# Patient Record
Sex: Female | Born: 1985 | Race: Black or African American | Hispanic: No | Marital: Married | Smoking: Never smoker
Health system: Southern US, Community
[De-identification: ages and names within clinical notes are randomized; demographics above are authoritative.]

## PROBLEM LIST (undated history)

## (undated) DIAGNOSIS — I10 Essential (primary) hypertension: Secondary | ICD-10-CM

## (undated) DIAGNOSIS — R7303 Prediabetes: Secondary | ICD-10-CM

## (undated) DIAGNOSIS — F419 Anxiety disorder, unspecified: Secondary | ICD-10-CM

## (undated) DIAGNOSIS — K219 Gastro-esophageal reflux disease without esophagitis: Secondary | ICD-10-CM

## (undated) HISTORY — DX: Gastro-esophageal reflux disease without esophagitis: K21.9

## (undated) HISTORY — DX: Essential (primary) hypertension: I10

## (undated) HISTORY — PX: CHOLECYSTECTOMY: SHX55

## (undated) HISTORY — DX: Anxiety disorder, unspecified: F41.9

## (undated) HISTORY — DX: Prediabetes: R73.03

---

## 2004-10-25 ENCOUNTER — Emergency Department (HOSPITAL_COMMUNITY): Admission: EM | Admit: 2004-10-25 | Discharge: 2004-10-25 | Payer: Self-pay | Admitting: Emergency Medicine

## 2006-09-04 ENCOUNTER — Other Ambulatory Visit: Payer: Self-pay

## 2006-09-04 ENCOUNTER — Emergency Department: Payer: Self-pay | Admitting: Emergency Medicine

## 2006-09-08 ENCOUNTER — Ambulatory Visit: Payer: Self-pay | Admitting: General Surgery

## 2007-11-19 ENCOUNTER — Ambulatory Visit: Payer: Self-pay | Admitting: Internal Medicine

## 2012-02-07 ENCOUNTER — Ambulatory Visit: Payer: Self-pay | Admitting: Podiatry

## 2012-09-04 ENCOUNTER — Ambulatory Visit: Payer: Self-pay | Admitting: Podiatry

## 2013-06-25 ENCOUNTER — Emergency Department (INDEPENDENT_AMBULATORY_CARE_PROVIDER_SITE_OTHER): Payer: 59

## 2013-06-25 ENCOUNTER — Emergency Department (HOSPITAL_COMMUNITY)
Admission: EM | Admit: 2013-06-25 | Discharge: 2013-06-25 | Disposition: A | Discharge status: Home / Self Care | Payer: 59 | Source: Home / Self Care | Attending: Family Medicine | Admitting: Family Medicine

## 2013-06-25 ENCOUNTER — Encounter (HOSPITAL_COMMUNITY): Payer: Self-pay | Admitting: Emergency Medicine

## 2013-06-25 DIAGNOSIS — M25579 Pain in unspecified ankle and joints of unspecified foot: Secondary | ICD-10-CM

## 2013-06-25 DIAGNOSIS — M25571 Pain in right ankle and joints of right foot: Secondary | ICD-10-CM

## 2013-06-25 MED ORDER — IBUPROFEN 800 MG PO TABS: 800.0000 mg | ORAL_TABLET | Freq: Three times a day (TID) | ORAL | Status: DC

## 2013-06-25 NOTE — ED Notes (Signed)
C/o right ankle pain x 2 days.  States pain sensation is like a soreness.  Denies injury.  Pt has been using ace wrap with mild relief.

## 2013-06-25 NOTE — ED Provider Notes (Signed)
CSN: [id number]     Arrival date & time 06/25/13  1417 History   First MD Initiated Contact with Patient 06/25/13 1541     Chief Complaint  Patient presents with  . Ankle Pain   (Consider location/radiation/quality/duration/timing/severity/associated sxs/prior Treatment) Patient is a 27 y.o. female presenting with ankle pain. The history is provided by the patient. No language interpreter was used.  Ankle Pain Location:  Ankle Ankle location:  R ankle Pain details:    Quality:  Aching   Radiates to:  Does not radiate   Severity:  Mild   Onset quality:  Gradual   Timing:  Constant   Progression:  Worsening Chronicity:  New Dislocation: no   Foreign body present:  No foreign bodies Relieved by:  Nothing Worsened by:  Nothing tried Ineffective treatments:  None tried Risk factors: no recent illness     History reviewed. No pertinent past medical history. History reviewed. No pertinent past surgical history. History reviewed. No pertinent family history. History  Substance Use Topics  . Smoking status: Never Smoker   . Smokeless tobacco: Not on file  . Alcohol Use: No   OB History   Grav Para Term Preterm Abortions TAB SAB Ect Mult Living                 Review of Systems  All other systems reviewed and are negative.    Allergies  Review of patient's allergies indicates no known allergies.  Home Medications   Current Outpatient Rx  Name  Route  Sig  Dispense  Refill  . benazepril (LOTENSIN) 5 MG tablet   Oral   Take 5 mg by mouth daily.         Marland Kitchen ibuprofen (ADVIL,MOTRIN) 800 MG tablet   Oral   Take 1 tablet (800 mg total) by mouth 3 (three) times daily.   21 tablet   0    BP 135/84  Pulse 77  Temp(Src) 98.1 F (36.7 C) (Oral)  Resp 20  SpO2 100%  LMP 06/01/2013 Physical Exam  Nursing note and vitals reviewed. Constitutional: She is oriented to person, place, and time. She appears well-developed and well-nourished.  HENT:  Head: Normocephalic.   Musculoskeletal: She exhibits tenderness.  Swollen tender left ankle  From with pain,  nv and ns intact  Neurological: She is alert and oriented to person, place, and time. She has normal reflexes.  Skin: Skin is warm.  Psychiatric: She has a normal mood and affect.    ED Course  Procedures (including critical care time) Labs Review Labs Reviewed - No data to display Imaging Review Dg Ankle Complete Right  06/25/2013   CLINICAL DATA:  Pain, swelling no history of trauma.  EXAM: RIGHT ANKLE - COMPLETE 3+ VIEW  COMPARISON:  None.  FINDINGS: Soft tissue swelling is appreciated within the medial and lateral malleolar regions. There is no evidence of acute fracture or dislocation.  IMPRESSION: Soft tissue swelling without evidence of acute osseous abnormalities.   Electronically Signed   By: Salome Holmes M.D.   On: 06/25/2013 16:02    EKG Interpretation    Date/Time:    Ventricular Rate:    PR Interval:    QRS Duration:   QT Interval:    QTC Calculation:   R Axis:     Text Interpretation:              MDM   1. Ankle pain, right    aso   Pt advised to follow  up with Dr. Jerl Santos for recheck in 1 week if pain persist    Elson Areas, [place] 06/25/13 1629  Medical screening examination/treatment/procedure(s) were performed by a resident physician or non-physician practitioner and as the supervising physician I was immediately available for consultation/collaboration.  Clementeen Graham, MD    Rodolph Bong, MD 06/26/13 [phone]

## 2013-07-15 HISTORY — PX: BUNIONECTOMY: SHX129

## 2014-03-15 ENCOUNTER — Ambulatory Visit: Payer: Self-pay | Admitting: Podiatry

## 2014-03-24 ENCOUNTER — Ambulatory Visit (INDEPENDENT_AMBULATORY_CARE_PROVIDER_SITE_OTHER): Payer: 59 | Admitting: Podiatry

## 2014-03-24 ENCOUNTER — Ambulatory Visit (INDEPENDENT_AMBULATORY_CARE_PROVIDER_SITE_OTHER): Payer: 59

## 2014-03-24 ENCOUNTER — Telehealth: Payer: Self-pay | Admitting: *Deleted

## 2014-03-24 ENCOUNTER — Encounter: Payer: Self-pay | Admitting: Podiatry

## 2014-03-24 VITALS — BP 133/86 | HR 70 | Resp 12

## 2014-03-24 DIAGNOSIS — R52 Pain, unspecified: Secondary | ICD-10-CM

## 2014-03-24 DIAGNOSIS — M76829 Posterior tibial tendinitis, unspecified leg: Secondary | ICD-10-CM

## 2014-03-24 DIAGNOSIS — M2010 Hallux valgus (acquired), unspecified foot: Secondary | ICD-10-CM

## 2014-03-24 DIAGNOSIS — M76822 Posterior tibial tendinitis, left leg: Secondary | ICD-10-CM

## 2014-03-24 DIAGNOSIS — Q6652 Congenital pes planus, left foot: Secondary | ICD-10-CM

## 2014-03-24 DIAGNOSIS — Q6650 Congenital pes planus, unspecified foot: Secondary | ICD-10-CM

## 2014-03-24 MED ORDER — METHYLPREDNISOLONE (PAK) 4 MG PO TABS: ORAL_TABLET | ORAL | Status: DC

## 2014-03-24 MED ORDER — MELOXICAM 15 MG PO TABS: 15.0000 mg | ORAL_TABLET | Freq: Every day | ORAL | Status: DC

## 2014-03-24 NOTE — Progress Notes (Signed)
   Subjective:    Patient ID: Linda Mckinney, female    DOB: 1985/08/13, 28 y.o.   MRN: [id number]  HPI  PT STATED INSIDE OF THE FOOT AND ANKLE IS SORE AND HAVE BURNING SENSATION FOR 3 MONTHS. THE FOOT IS GETTING WORSE, BUT NOT GETTING WORSE. THE FOOT GET AGGRAVATED BY SITTING OR STANDING. TRIED USED ICE, SOAK, AND GOLD BOND FOOT CREAM.  Review of Systems  All other systems reviewed and are negative.      Objective:   Physical Exam: I have reviewed her past mental history medications allergies surgeries social history and review of systems. Pulses are strongly palpable bilateral. Neurologic sensorium is intact per Semmes-Weinstein monofilament. Deep tendon reflexes are intact bilateral muscle strength is 5 over 5 dorsiflexors plantar flexors inverters everters all intrinsic musculature is intact with exception of the inverters the posterior tibial tendon which does appear to be week the left foot. Orthopedic evaluation demonstrates severe pes planus of the left foot with abduction of the midfoot secondary to pronation. Radiographic evaluation demonstrates severe pes planus. She has pain on direct palpation of the posterior tibial tendon and the navicular tuberosity which does appear to be fluctuance within the tendon sheath. 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 This is more than likely associated with the hyperpronation.        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 Concerns of a split tear is currently especially with the amount of fluid in the tendon sheath. Posterior tibial tendon dysfunction is another concern. Hallux abductovalgus deformity is also noted.  Plan: All conservative therapies have failed. This patient has worn a boot/Cam Walker. She has had steroid all and nonsteroidal therapy including injections. Physical therapy and chiropractic therapy. She  continues to wear her orthotics regularly but her left foot still hurts and is affecting her daily activities. She is requesting surgical intervention if at all possible. I will followup with her once her MRI has been performed.

## 2014-03-24 NOTE — Telephone Encounter (Signed)
Referral sent for MRI left foot to rule out tear of posterior tibial tendon.

## 2014-04-06 ENCOUNTER — Ambulatory Visit
Admission: RE | Admit: 2014-04-06 | Discharge: 2014-04-06 | Disposition: A | Discharge status: Home / Self Care | Payer: 59 | Source: Ambulatory Visit | Attending: Podiatry | Admitting: Podiatry

## 2014-04-06 DIAGNOSIS — M76822 Posterior tibial tendinitis, left leg: Secondary | ICD-10-CM

## 2014-04-06 DIAGNOSIS — R52 Pain, unspecified: Secondary | ICD-10-CM

## 2014-04-07 ENCOUNTER — Telehealth: Payer: Self-pay | Admitting: *Deleted

## 2014-04-07 NOTE — Telephone Encounter (Signed)
Patient called and stated she dropped the disk off yesterday around 4:30pm.  I told her I apologize, I have not received it but will look for it.  I told her we would let her know as soon as we get results back.  She stated, "Okay, thank you."

## 2014-04-07 NOTE — Telephone Encounter (Signed)
Message copied by Enedina Finner on Thu Apr 07, 2014 10:36 AM ------      Message from: Ernestene Kiel T      Created: Wed Apr 06, 2014  4:32 PM       Please send for an over read with [facility].  Let patient know that the results will be delayed.  Let me know that you received this inbasket ------

## 2014-04-07 NOTE — Telephone Encounter (Signed)
Did we find the disk?

## 2014-04-07 NOTE — Telephone Encounter (Signed)
I called and informed the patient that Dr. Al Corpus received the MRI results.  He wants to have them re-read so we are sending it to [facility].  We wanted to make you aware of the delay.  We will call once we get the results and let you know how to proceed.  Call if you have any further questions.  I called and spoke to [place] at [facility] requesting a cd of the MRI be sent to [place].  She stated that she had the patient to sign for a copy and bring to our office.  I told her I would call the patient to have her bring it by the office.  I called and left the patient a message asking her to bring the cd of the MRI to [place] as soon as possible so we can get it re-read by [facility].

## 2014-04-20 ENCOUNTER — Encounter: Payer: Self-pay | Admitting: Obstetrics & Gynecology

## 2014-04-20 ENCOUNTER — Ambulatory Visit (INDEPENDENT_AMBULATORY_CARE_PROVIDER_SITE_OTHER): Payer: 59 | Admitting: Obstetrics & Gynecology

## 2014-04-20 VITALS — BP 135/98 | HR 64 | Ht 62.0 in | Wt 232.0 lb

## 2014-04-20 DIAGNOSIS — Z01419 Encounter for gynecological examination (general) (routine) without abnormal findings: Secondary | ICD-10-CM

## 2014-04-20 DIAGNOSIS — Z1151 Encounter for screening for human papillomavirus (HPV): Secondary | ICD-10-CM

## 2014-04-20 DIAGNOSIS — Z113 Encounter for screening for infections with a predominantly sexual mode of transmission: Secondary | ICD-10-CM

## 2014-04-20 DIAGNOSIS — Z124 Encounter for screening for malignant neoplasm of cervix: Secondary | ICD-10-CM

## 2014-04-20 NOTE — Patient Instructions (Signed)
Preventive Care for Adults A healthy lifestyle and preventive care can promote health and wellness. Preventive health guidelines for women include the following key practices.  A routine yearly physical is a good way to check with your health care provider about your health and preventive screening. It is a chance to share any concerns and updates on your health and to receive a thorough exam.  Visit your dentist for a routine exam and preventive care every 6 months. Brush your teeth twice a day and floss once a day. Good oral hygiene prevents tooth decay and gum disease.  The frequency of eye exams is based on your age, health, family medical history, use of contact lenses, and other factors. Follow your health care provider's recommendations for frequency of eye exams.  Eat a healthy diet. Foods like vegetables, fruits, whole grains, low-fat dairy products, and lean protein foods contain the nutrients you need without too many calories. Decrease your intake of foods high in solid fats, added sugars, and salt. Eat the right amount of calories for you.Get information about a proper diet from your health care provider, if necessary.  Regular physical exercise is one of the most important things you can do for your health. Most adults should get at least 150 minutes of moderate-intensity exercise (any activity that increases your heart rate and causes you to sweat) each week. In addition, most adults need muscle-strengthening exercises on 2 or more days a week.  Maintain a healthy weight. The body mass index (BMI) is a screening tool to identify possible weight problems. It provides an estimate of body fat based on height and weight. Your health care provider can find your BMI and can help you achieve or maintain a healthy weight.For adults 20 years and older:  A BMI below 18.5 is considered underweight.  A BMI of 18.5 to 24.9 is normal.  A BMI of 25 to 29.9 is considered overweight.  A BMI of  30 and above is considered obese.  Maintain normal blood lipids and cholesterol levels by exercising and minimizing your intake of saturated fat. Eat a balanced diet with plenty of fruit and vegetables. Blood tests for lipids and cholesterol should begin at age 20 and be repeated every 5 years. If your lipid or cholesterol levels are high, you are over 50, or you are at high risk for heart disease, you may need your cholesterol levels checked more frequently.Ongoing high lipid and cholesterol levels should be treated with medicines if diet and exercise are not working.  If you smoke, find out from your health care provider how to quit. If you do not use tobacco, do not start.  Lung cancer screening is recommended for adults aged 55-80 years who are at high risk for developing lung cancer because of a history of smoking. A yearly low-dose CT scan of the lungs is recommended for people who have at least a 30-pack-year history of smoking and are a current smoker or have quit within the past 15 years. A pack year of smoking is smoking an average of 1 pack of cigarettes a day for 1 year (for example: 1 pack a day for 30 years or 2 packs a day for 15 years). Yearly screening should continue until the smoker has stopped smoking for at least 15 years. Yearly screening should be stopped for people who develop a health problem that would prevent them from having lung cancer treatment.  If you are pregnant, do not drink alcohol. If you are breastfeeding,   be very cautious about drinking alcohol. If you are not pregnant and choose to drink alcohol, do not have more than 1 drink per day. One drink is considered to be 12 ounces (355 mL) of beer, 5 ounces (148 mL) of wine, or 1.5 ounces (44 mL) of liquor.  Avoid use of street drugs. Do not share needles with anyone. Ask for help if you need support or instructions about stopping the use of drugs.  High blood pressure causes heart disease and increases the risk of  stroke. Your blood pressure should be checked at least every 1 to 2 years. Ongoing high blood pressure should be treated with medicines if weight loss and exercise do not work.  If you are 75-52 years old, ask your health care provider if you should take aspirin to prevent strokes.  Diabetes screening involves taking a blood sample to check your fasting blood sugar level. This should be done once every 3 years, after age 15, if you are within normal weight and without risk factors for diabetes. Testing should be considered at a younger age or be carried out more frequently if you are overweight and have at least 1 risk factor for diabetes.  Breast cancer screening is essential preventive care for women. You should practice "breast self-awareness." This means understanding the normal appearance and feel of your breasts and may include breast self-examination. Any changes detected, no matter how small, should be reported to a health care provider. Women in their 58s and 30s should have a clinical breast exam (CBE) by a health care provider as part of a regular health exam every 1 to 3 years. After age 16, women should have a CBE every year. Starting at age 53, women should consider having a mammogram (breast X-ray test) every year. Women who have a family history of breast cancer should talk to their health care provider about genetic screening. Women at a high risk of breast cancer should talk to their health care providers about having an MRI and a mammogram every year.  Breast cancer gene (BRCA)-related cancer risk assessment is recommended for women who have family members with BRCA-related cancers. BRCA-related cancers include breast, ovarian, tubal, and peritoneal cancers. Having family members with these cancers may be associated with an increased risk for harmful changes (mutations) in the breast cancer genes BRCA1 and BRCA2. Results of the assessment will determine the need for genetic counseling and  BRCA1 and BRCA2 testing.  Routine pelvic exams to screen for cancer are no longer recommended for nonpregnant women who are considered low risk for cancer of the pelvic organs (ovaries, uterus, and vagina) and who do not have symptoms. Ask your health care provider if a screening pelvic exam is right for you.  If you have had past treatment for cervical cancer or a condition that could lead to cancer, you need Pap tests and screening for cancer for at least 20 years after your treatment. If Pap tests have been discontinued, your risk factors (such as having a new sexual partner) need to be reassessed to determine if screening should be resumed. Some women have medical problems that increase the chance of getting cervical cancer. In these cases, your health care provider may recommend more frequent screening and Pap tests.  The HPV test is an additional test that may be used for cervical cancer screening. The HPV test looks for the virus that can cause the cell changes on the cervix. The cells collected during the Pap test can be  tested for HPV. The HPV test could be used to screen women aged 30 years and older, and should be used in women of any age who have unclear Pap test results. After the age of 30, women should have HPV testing at the same frequency as a Pap test.  Colorectal cancer can be detected and often prevented. Most routine colorectal cancer screening begins at the age of 50 years and continues through age 75 years. However, your health care provider may recommend screening at an earlier age if you have risk factors for colon cancer. On a yearly basis, your health care provider may provide home test kits to check for hidden blood in the stool. Use of a small camera at the end of a tube, to directly examine the colon (sigmoidoscopy or colonoscopy), can detect the earliest forms of colorectal cancer. Talk to your health care provider about this at age 50, when routine screening begins. Direct  exam of the colon should be repeated every 5-10 years through age 75 years, unless early forms of pre-cancerous polyps or small growths are found.  People who are at an increased risk for hepatitis B should be screened for this virus. You are considered at high risk for hepatitis B if:  You were born in a country where hepatitis B occurs often. Talk with your health care provider about which countries are considered high risk.  Your parents were born in a high-risk country and you have not received a shot to protect against hepatitis B (hepatitis B vaccine).  You have HIV or AIDS.  You use needles to inject street drugs.  You live with, or have sex with, someone who has hepatitis B.  You get hemodialysis treatment.  You take certain medicines for conditions like cancer, organ transplantation, and autoimmune conditions.  Hepatitis C blood testing is recommended for all people born from 1945 through 1965 and any individual with known risks for hepatitis C.  Practice safe sex. Use condoms and avoid high-risk sexual practices to reduce the spread of sexually transmitted infections (STIs). STIs include gonorrhea, chlamydia, syphilis, trichomonas, herpes, HPV, and human immunodeficiency virus (HIV). Herpes, HIV, and HPV are viral illnesses that have no cure. They can result in disability, cancer, and death.  You should be screened for sexually transmitted illnesses (STIs) including gonorrhea and chlamydia if:  You are sexually active and are younger than 24 years.  You are older than 24 years and your health care provider tells you that you are at risk for this type of infection.  Your sexual activity has changed since you were last screened and you are at an increased risk for chlamydia or gonorrhea. Ask your health care provider if you are at risk.  If you are at risk of being infected with HIV, it is recommended that you take a prescription medicine daily to prevent HIV infection. This is  called preexposure prophylaxis (PrEP). You are considered at risk if:  You are a heterosexual woman, are sexually active, and are at increased risk for HIV infection.  You take drugs by injection.  You are sexually active with a partner who has HIV.  Talk with your health care provider about whether you are at high risk of being infected with HIV. If you choose to begin PrEP, you should first be tested for HIV. You should then be tested every 3 months for as long as you are taking PrEP.  Osteoporosis is a disease in which the bones lose minerals and strength   with aging. This can result in serious bone fractures or breaks. The risk of osteoporosis can be identified using a bone density scan. Women ages 65 years and over and women at risk for fractures or osteoporosis should discuss screening with their health care providers. Ask your health care provider whether you should take a calcium supplement or vitamin D to reduce the rate of osteoporosis.  Menopause can be associated with physical symptoms and risks. Hormone replacement therapy is available to decrease symptoms and risks. You should talk to your health care provider about whether hormone replacement therapy is right for you.  Use sunscreen. Apply sunscreen liberally and repeatedly throughout the day. You should seek shade when your shadow is shorter than you. Protect yourself by wearing long sleeves, pants, a wide-brimmed hat, and sunglasses year round, whenever you are outdoors.  Once a month, do a whole body skin exam, using a mirror to look at the skin on your back. Tell your health care provider of new moles, moles that have irregular borders, moles that are larger than a pencil eraser, or moles that have changed in shape or color.  Stay current with required vaccines (immunizations).  Influenza vaccine. All adults should be immunized every year.  Tetanus, diphtheria, and acellular pertussis (Td, Tdap) vaccine. Pregnant women should  receive 1 dose of Tdap vaccine during each pregnancy. The dose should be obtained regardless of the length of time since the last dose. Immunization is preferred during the 27th-36th week of gestation. An adult who has not previously received Tdap or who does not know her vaccine status should receive 1 dose of Tdap. This initial dose should be followed by tetanus and diphtheria toxoids (Td) booster doses every 10 years. Adults with an unknown or incomplete history of completing a 3-dose immunization series with Td-containing vaccines should begin or complete a primary immunization series including a Tdap dose. Adults should receive a Td booster every 10 years.  Varicella vaccine. An adult without evidence of immunity to varicella should receive 2 doses or a second dose if she has previously received 1 dose. Pregnant females who do not have evidence of immunity should receive the first dose after pregnancy. This first dose should be obtained before leaving the health care facility. The second dose should be obtained 4-8 weeks after the first dose.  Human papillomavirus (HPV) vaccine. Females aged 13-26 years who have not received the vaccine previously should obtain the 3-dose series. The vaccine is not recommended for use in pregnant females. However, pregnancy testing is not needed before receiving a dose. If a female is found to be pregnant after receiving a dose, no treatment is needed. In that case, the remaining doses should be delayed until after the pregnancy. Immunization is recommended for any person with an immunocompromised condition through the age of 26 years if she did not get any or all doses earlier. During the 3-dose series, the second dose should be obtained 4-8 weeks after the first dose. The third dose should be obtained 24 weeks after the first dose and 16 weeks after the second dose.  Zoster vaccine. One dose is recommended for adults aged 60 years or older unless certain conditions are  present.  Measles, mumps, and rubella (MMR) vaccine. Adults born before 1957 generally are considered immune to measles and mumps. Adults born in 1957 or later should have 1 or more doses of MMR vaccine unless there is a contraindication to the vaccine or there is laboratory evidence of immunity to   each of the three diseases. A routine second dose of MMR vaccine should be obtained at least 28 days after the first dose for students attending postsecondary schools, health care workers, or international travelers. People who received inactivated measles vaccine or an unknown type of measles vaccine during 1963-1967 should receive 2 doses of MMR vaccine. People who received inactivated mumps vaccine or an unknown type of mumps vaccine before 1979 and are at high risk for mumps infection should consider immunization with 2 doses of MMR vaccine. For females of childbearing age, rubella immunity should be determined. If there is no evidence of immunity, females who are not pregnant should be vaccinated. If there is no evidence of immunity, females who are pregnant should delay immunization until after pregnancy. Unvaccinated health care workers born before 1957 who lack laboratory evidence of measles, mumps, or rubella immunity or laboratory confirmation of disease should consider measles and mumps immunization with 2 doses of MMR vaccine or rubella immunization with 1 dose of MMR vaccine.  Pneumococcal 13-valent conjugate (PCV13) vaccine. When indicated, a person who is uncertain of her immunization history and has no record of immunization should receive the PCV13 vaccine. An adult aged 19 years or older who has certain medical conditions and has not been previously immunized should receive 1 dose of PCV13 vaccine. This PCV13 should be followed with a dose of pneumococcal polysaccharide (PPSV23) vaccine. The PPSV23 vaccine dose should be obtained at least 8 weeks after the dose of PCV13 vaccine. An adult aged 19  years or older who has certain medical conditions and previously received 1 or more doses of PPSV23 vaccine should receive 1 dose of PCV13. The PCV13 vaccine dose should be obtained 1 or more years after the last PPSV23 vaccine dose.  Pneumococcal polysaccharide (PPSV23) vaccine. When PCV13 is also indicated, PCV13 should be obtained first. All adults aged 65 years and older should be immunized. An adult younger than age 65 years who has certain medical conditions should be immunized. Any person who resides in a nursing home or long-term care facility should be immunized. An adult smoker should be immunized. People with an immunocompromised condition and certain other conditions should receive both PCV13 and PPSV23 vaccines. People with human immunodeficiency virus (HIV) infection should be immunized as soon as possible after diagnosis. Immunization during chemotherapy or radiation therapy should be avoided. Routine use of PPSV23 vaccine is not recommended for American Indians, [place] Natives, or people younger than 65 years unless there are medical conditions that require PPSV23 vaccine. When indicated, people who have unknown immunization and have no record of immunization should receive PPSV23 vaccine. One-time revaccination 5 years after the first dose of PPSV23 is recommended for people aged 19-64 years who have chronic kidney failure, nephrotic syndrome, asplenia, or immunocompromised conditions. People who received 1-2 doses of PPSV23 before age 65 years should receive another dose of PPSV23 vaccine at age 65 years or later if at least 5 years have passed since the previous dose. Doses of PPSV23 are not needed for people immunized with PPSV23 at or after age 65 years.  Meningococcal vaccine. Adults with asplenia or persistent complement component deficiencies should receive 2 doses of quadrivalent meningococcal conjugate (MenACWY-D) vaccine. The doses should be obtained at least 2 months apart.  Microbiologists working with certain meningococcal bacteria, military recruits, people at risk during an outbreak, and people who travel to or live in countries with a high rate of meningitis should be immunized. A first-year college student up through age   21 years who is living in a residence hall should receive a dose if she did not receive a dose on or after her 16th birthday. Adults who have certain high-risk conditions should receive one or more doses of vaccine.  Hepatitis A vaccine. Adults who wish to be protected from this disease, have certain high-risk conditions, work with hepatitis A-infected animals, work in hepatitis A research labs, or travel to or work in countries with a high rate of hepatitis A should be immunized. Adults who were previously unvaccinated and who anticipate close contact with an international adoptee during the first 60 days after arrival in the [place] from a country with a high rate of hepatitis A should be immunized.  Hepatitis B vaccine. Adults who wish to be protected from this disease, have certain high-risk conditions, may be exposed to blood or other infectious body fluids, are household contacts or sex partners of hepatitis B positive people, are clients or workers in certain care facilities, or travel to or work in countries with a high rate of hepatitis B should be immunized.  Haemophilus influenzae type b (Hib) vaccine. A previously unvaccinated person with asplenia or sickle cell disease or having a scheduled splenectomy should receive 1 dose of Hib vaccine. Regardless of previous immunization, a recipient of a hematopoietic stem cell transplant should receive a 3-dose series 6-12 months after her successful transplant. Hib vaccine is not recommended for adults with HIV infection. Preventive Services / Frequency Ages 64 to 68 years  Blood pressure check.** / Every 1 to 2 years.  Lipid and cholesterol check.** / Every 5 years beginning at age  22.  Clinical breast exam.** / Every 3 years for women in their 88s and 53s.  BRCA-related cancer risk assessment.** / For women who have family members with a BRCA-related cancer (breast, ovarian, tubal, or peritoneal cancers).  Pap test.** / Every 2 years from ages 90 through 51. Every 3 years starting at age 21 through age 56 or 3 with a history of 3 consecutive normal Pap tests.  HPV screening.** / Every 3 years from ages 24 through ages 1 to 46 with a history of 3 consecutive normal Pap tests.  Hepatitis C blood test.** / For any individual with known risks for hepatitis C.  Skin self-exam. / Monthly.  Influenza vaccine. / Every year.  Tetanus, diphtheria, and acellular pertussis (Tdap, Td) vaccine.** / Consult your health care provider. Pregnant women should receive 1 dose of Tdap vaccine during each pregnancy. 1 dose of Td every 10 years.  Varicella vaccine.** / Consult your health care provider. Pregnant females who do not have evidence of immunity should receive the first dose after pregnancy.  HPV vaccine. / 3 doses over 6 months, if 72 and younger. The vaccine is not recommended for use in pregnant females. However, pregnancy testing is not needed before receiving a dose.  Measles, mumps, rubella (MMR) vaccine.** / You need at least 1 dose of MMR if you were born in 1957 or later. You may also need a 2nd dose. For females of childbearing age, rubella immunity should be determined. If there is no evidence of immunity, females who are not pregnant should be vaccinated. If there is no evidence of immunity, females who are pregnant should delay immunization until after pregnancy.  Pneumococcal 13-valent conjugate (PCV13) vaccine.** / Consult your health care provider.  Pneumococcal polysaccharide (PPSV23) vaccine.** / 1 to 2 doses if you smoke cigarettes or if you have certain conditions.  Meningococcal vaccine.** /  1 dose if you are age 19 to 21 years and a first-year college  student living in a residence hall, or have one of several medical conditions, you need to get vaccinated against meningococcal disease. You may also need additional booster doses.  Hepatitis A vaccine.** / Consult your health care provider.  Hepatitis B vaccine.** / Consult your health care provider.  Haemophilus influenzae type b (Hib) vaccine.** / Consult your health care provider. Ages 40 to 64 years  Blood pressure check.** / Every 1 to 2 years.  Lipid and cholesterol check.** / Every 5 years beginning at age 20 years.  Lung cancer screening. / Every year if you are aged 55-80 years and have a 30-pack-year history of smoking and currently smoke or have quit within the past 15 years. Yearly screening is stopped once you have quit smoking for at least 15 years or develop a health problem that would prevent you from having lung cancer treatment.  Clinical breast exam.** / Every year after age 40 years.  BRCA-related cancer risk assessment.** / For women who have family members with a BRCA-related cancer (breast, ovarian, tubal, or peritoneal cancers).  Mammogram.** / Every year beginning at age 40 years and continuing for as long as you are in good health. Consult with your health care provider.  Pap test.** / Every 3 years starting at age 30 years through age 65 or 70 years with a history of 3 consecutive normal Pap tests.  HPV screening.** / Every 3 years from ages 30 years through ages 65 to 70 years with a history of 3 consecutive normal Pap tests.  Fecal occult blood test (FOBT) of stool. / Every year beginning at age 50 years and continuing until age 75 years. You may not need to do this test if you get a colonoscopy every 10 years.  Flexible sigmoidoscopy or colonoscopy.** / Every 5 years for a flexible sigmoidoscopy or every 10 years for a colonoscopy beginning at age 50 years and continuing until age 75 years.  Hepatitis C blood test.** / For all people born from 1945 through  1965 and any individual with known risks for hepatitis C.  Skin self-exam. / Monthly.  Influenza vaccine. / Every year.  Tetanus, diphtheria, and acellular pertussis (Tdap/Td) vaccine.** / Consult your health care provider. Pregnant women should receive 1 dose of Tdap vaccine during each pregnancy. 1 dose of Td every 10 years.  Varicella vaccine.** / Consult your health care provider. Pregnant females who do not have evidence of immunity should receive the first dose after pregnancy.  Zoster vaccine.** / 1 dose for adults aged 60 years or older.  Measles, mumps, rubella (MMR) vaccine.** / You need at least 1 dose of MMR if you were born in 1957 or later. You may also need a 2nd dose. For females of childbearing age, rubella immunity should be determined. If there is no evidence of immunity, females who are not pregnant should be vaccinated. If there is no evidence of immunity, females who are pregnant should delay immunization until after pregnancy.  Pneumococcal 13-valent conjugate (PCV13) vaccine.** / Consult your health care provider.  Pneumococcal polysaccharide (PPSV23) vaccine.** / 1 to 2 doses if you smoke cigarettes or if you have certain conditions.  Meningococcal vaccine.** / Consult your health care provider.  Hepatitis A vaccine.** / Consult your health care provider.  Hepatitis B vaccine.** / Consult your health care provider.  Haemophilus influenzae type b (Hib) vaccine.** / Consult your health care provider. Ages 65   years and over  Blood pressure check.** / Every 1 to 2 years.  Lipid and cholesterol check.** / Every 5 years beginning at age 22 years.  Lung cancer screening. / Every year if you are aged 73-80 years and have a 30-pack-year history of smoking and currently smoke or have quit within the past 15 years. Yearly screening is stopped once you have quit smoking for at least 15 years or develop a health problem that would prevent you from having lung cancer  treatment.  Clinical breast exam.** / Every year after age 4 years.  BRCA-related cancer risk assessment.** / For women who have family members with a BRCA-related cancer (breast, ovarian, tubal, or peritoneal cancers).  Mammogram.** / Every year beginning at age 40 years and continuing for as long as you are in good health. Consult with your health care provider.  Pap test.** / Every 3 years starting at age 9 years through age 34 or 91 years with 3 consecutive normal Pap tests. Testing can be stopped between 65 and 70 years with 3 consecutive normal Pap tests and no abnormal Pap or HPV tests in the past 10 years.  HPV screening.** / Every 3 years from ages 57 years through ages 64 or 45 years with a history of 3 consecutive normal Pap tests. Testing can be stopped between 65 and 70 years with 3 consecutive normal Pap tests and no abnormal Pap or HPV tests in the past 10 years.  Fecal occult blood test (FOBT) of stool. / Every year beginning at age 15 years and continuing until age 17 years. You may not need to do this test if you get a colonoscopy every 10 years.  Flexible sigmoidoscopy or colonoscopy.** / Every 5 years for a flexible sigmoidoscopy or every 10 years for a colonoscopy beginning at age 86 years and continuing until age 71 years.  Hepatitis C blood test.** / For all people born from [birth year] through 1967 and any individual with known risks for hepatitis C.  Osteoporosis screening.** / A one-time screening for women ages 83 years and over and women at risk for fractures or osteoporosis.  Skin self-exam. / Monthly.  Influenza vaccine. / Every year.  Tetanus, diphtheria, and acellular pertussis (Tdap/Td) vaccine.** / 1 dose of Td every 10 years.  Varicella vaccine.** / Consult your health care provider.  Zoster vaccine.** / 1 dose for adults aged 61 years or older.  Pneumococcal 13-valent conjugate (PCV13) vaccine.** / Consult your health care provider.  Pneumococcal  polysaccharide (PPSV23) vaccine.** / 1 dose for all adults aged 28 years and older.  Meningococcal vaccine.** / Consult your health care provider.  Hepatitis A vaccine.** / Consult your health care provider.  Hepatitis B vaccine.** / Consult your health care provider.  Haemophilus influenzae type b (Hib) vaccine.** / Consult your health care provider. ** Family history and personal history of risk and conditions may change your health care provider's recommendations. Document Released: 08/27/2001 Document Revised: 11/15/2013 Document Reviewed: 11/26/2010 [facility] Patient Information 2015 [place], [place]. This information is not intended to replace advice given to you by your health care provider. Make sure you discuss any questions you have with your health care provider.

## 2014-04-20 NOTE — Progress Notes (Signed)
    GYNECOLOGY CLINIC ANNUAL PREVENTATIVE CARE ENCOUNTER NOTE  Subjective:     [name] is a 28 y.o. G0  female here to establish care and for routine annual gynecologic exam.  Patient is sexually active with a female partner who is HIV positive; she uses condoms all the time.  Gets STI testing every year during her annual exam. No plans for pregnancy anytime soon. Normal menstrual periods, no other GYN concerns.     Obstetric and  Gynecologic History Patient's last menstrual period was 04/15/2014.  G0. Contraception: condoms Last Pap: 2014. Results were: normal  The following portions of the patient's history were reviewed and updated as appropriate: allergies, current medications, past family history, past medical history, past social history, past surgical history and problem list.  Review of Systems Pertinent items are noted in HPI.    Objective:   BP 135/98  Pulse 64  Ht 5\' 2"  (1.575 m)  Wt 232 lb (105.235 kg)  BMI 42.42 kg/m2  LMP 04/15/2014 GENERAL: Well-developed, obese female in no acute distress.  HEENT: Normocephalic, atraumatic. Sclerae anicteric.  NECK: Supple. Normal thyroid.  LUNGS: Clear to auscultation bilaterally.  HEART: Regular rate and rhythm.  BREASTS: Symmetric in size. No masses, skin changes, nipple drainage, or lymphadenopathy.  ABDOMEN: Soft, obese, nontender, nondistended. No organomegaly palpated. PELVIC: Normal external female genitalia. Vagina is pink and rugated. Normal discharge. Normal cervix contour. Pap smear obtained. Unable to palpate uterus or adnexa secondary to habitus  EXTREMITIES: No cyanosis, clubbing, or edema, 2+ distal pulses.  Assessment:   Annual gynecologic examination Desires STI screen   Plan:   Pap and STI screen done, will follow up results and manage accordingly. Routine preventative health maintenance measures emphasized, patient has PCP.   Linda Mckinney  Petrina Melby, MD, FACOG Attending Obstetrician &  Gynecologist Center for [facility], [facility]

## 2014-04-21 LAB — HIV ANTIBODY (ROUTINE TESTING W REFLEX): HIV: NONREACTIVE

## 2014-04-21 LAB — HEPATITIS C ANTIBODY: HCV Ab: NEGATIVE

## 2014-04-21 LAB — CYTOLOGY - PAP

## 2014-04-21 LAB — RPR

## 2014-05-03 ENCOUNTER — Encounter: Payer: Self-pay | Admitting: Podiatry

## 2014-05-05 ENCOUNTER — Ambulatory Visit (INDEPENDENT_AMBULATORY_CARE_PROVIDER_SITE_OTHER): Payer: 59 | Admitting: Podiatry

## 2014-05-05 ENCOUNTER — Encounter: Payer: Self-pay | Admitting: Podiatry

## 2014-05-05 VITALS — BP 132/78 | HR 67 | Resp 13

## 2014-05-05 DIAGNOSIS — M76822 Posterior tibial tendinitis, left leg: Secondary | ICD-10-CM

## 2014-05-05 DIAGNOSIS — Q6652 Congenital pes planus, left foot: Secondary | ICD-10-CM

## 2014-05-05 DIAGNOSIS — M2012 Hallux valgus (acquired), left foot: Secondary | ICD-10-CM

## 2014-05-05 NOTE — Progress Notes (Signed)
She presents today to discuss her MRI and consider foot surgery. The MRI stated posterior tibial tendinitis and tenosynovitis. She still complains of plantar fasciitis and her bunion to her left foot.  Objective: Vital signs are stable she is alert and oriented x3. Pulses are strongly palpable left foot. She has pain on palpation of the posterior tibial tendon as it courses beneath the medial malleolus extending to the navicular tuberosity. Weightbearing her foot is severely abducted. She has pain on palpation medial continued tubercle of the left heel. 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  Plan: Discussed etiology pathology conservative versus surgical therapies. Consented her today for a endoscopic plantar fasciotomy of her left foot. A Kidner procedure with advancement of her posterior tibial tendon left foot. Austin bunion repair with screw fixation left foot. Application of a below knee cast left foot. I answered all the questions regarding these procedures to the best of my ability in layman's terms. We went over the possible postop complications which may include but are not limited to postop pain bleeding swelling infection need for further surgery over correction under correction loss of digit loss of limb loss of life.  She signed all 3 pages of the consent form and she was dispensed the appropriate postop and preoperative instructions. I will followup with her at the time of surgery.

## 2014-05-05 NOTE — Patient Instructions (Signed)
Pre-Operative Instructions  Congratulations, you have decided to take an important step to improving your quality of life.  You can be assured that the doctors of [facility] will be with you every step of the way.  1. Plan to be at the surgery center/hospital at least 1 (one) hour prior to your scheduled time unless otherwise directed by the surgical center/hospital staff.  You must have a responsible adult accompany you, remain during the surgery and drive you home.  Make sure you have directions to the surgical center/hospital and know how to get there on time. 2. For hospital based surgery you will need to obtain a history and physical form from your family physician within 1 month prior to the date of surgery- we will give you a form for you primary physician.  3. We make every effort to accommodate the date you request for surgery.  There are however, times where surgery dates or times have to be moved.  We will contact you as soon as possible if a change in schedule is required.   4. No Aspirin/Ibuprofen for one week before surgery.  If you are on aspirin, any non-steroidal anti-inflammatory medications (Mobic, Aleve, Ibuprofen) you should stop taking it 7 days prior to your surgery.  You make take Tylenol  For pain prior to surgery.  5. Medications- If you are taking daily heart and blood pressure medications, seizure, reflux, allergy, asthma, anxiety, pain or diabetes medications, make sure the surgery center/hospital is aware before the day of surgery so they may notify you which medications to take or avoid the day of surgery. 6. No food or drink after midnight the night before surgery unless directed otherwise by surgical center/hospital staff. 7. No alcoholic beverages 24 hours prior to surgery.  No smoking 24 hours prior to or 24 hours after surgery. 8. Wear loose pants or shorts- loose enough to fit over bandages, boots, and casts. 9. No slip on shoes, sneakers are best. 10. Bring  your boot with you to the surgery center/hospital.  Also bring crutches or a walker if your physician has prescribed it for you.  If you do not have this equipment, it will be provided for you after surgery. 11. If you have not been contracted by the surgery center/hospital by the day before your surgery, call to confirm the date and time of your surgery. 12. Leave-time from work may vary depending on the type of surgery you have.  Appropriate arrangements should be made prior to surgery with your employer. 13. Prescriptions will be provided immediately following surgery by your doctor.  Have these filled as soon as possible after surgery and take the medication as directed. 14. Remove nail polish on the operative foot. 15. Wash the night before surgery.  The night before surgery wash the foot and leg well with the antibacterial soap provided and water paying special attention to beneath the toenails and in between the toes.  Rinse thoroughly with water and dry well with a towel.  Perform this wash unless told not to do so by your physician.  Enclosed: 1 Ice pack (please put in freezer the night before surgery)   1 Hibiclens skin cleaner   Pre-op Instructions  If you have any questions regarding the instructions, do not hesitate to call our office.  [place]: [address] [place], [place] [address] [phone]  [place]: [address] [place], [place] [address] [phone]  [place]: [address]  , [place] [address] [phone]  Dr. Richard   Tuchman DPM, Dr. Norman Regal DPM Dr. Richard Sikora DPM, Dr. M. Todd Hyatt DPM, Dr. Kathryn Egerton DPM 

## 2014-05-09 ENCOUNTER — Telehealth: Payer: Self-pay | Admitting: *Deleted

## 2014-05-09 NOTE — Telephone Encounter (Signed)
Pt asked to schedule surgery with Dr. Al CorpusHyatt.  I left message to call and I would help, but if unable to call today, call 05/10/2014 and [facility].

## 2014-05-12 NOTE — Telephone Encounter (Signed)
I called and left her a message to return my call to schedule surgery.

## 2014-05-13 NOTE — Telephone Encounter (Signed)
I was calling to schedule my surgery.  Please give me a call.  Thank you.

## 2014-05-17 NOTE — Telephone Encounter (Signed)
I attempted to call the patient.  I left her a message to call me back to schedule surgery.  Please call after 2 pm and ask to speak to me.

## 2014-05-17 NOTE — Telephone Encounter (Signed)
I'm returning your phone call.  If you would give me a call back.  I attempted to call her back, left another message.  Call me tomorrow.

## 2014-05-18 NOTE — Telephone Encounter (Signed)
-----   Message from Darreld McleanJessica M Smith sent at 05/18/2014  8:18 AM EST ----- Regarding: Returning call to schedule surgery Contact: [phone] Linda Mckinney, this patient is returning your call to schedule her surgery. She wanted you to know she has a dentist appointment at 10 am so if you could call before then or a little later. Thank you.

## 2014-05-18 NOTE — Telephone Encounter (Signed)
I returned her call.   She stated, "I don't want to do surgery this month.  Can we schedule it for the first week of December?"  I offered her 06/17/2014 or 06/24/2014.  "I'll take 06/17/2014.  Is there anything else I need to do?"  I told her to go on-line and complete the forms for the surgical center.  You can find the information in your surgical packet.  The surgical center will call you a day or two prior to the surgery date.  They will let you know what time to arrive.  If you have any questions about medication or get signs of a cold or infection ask them if it is okay to have the surgery when they call.  Take care.

## 2014-05-25 ENCOUNTER — Telehealth: Payer: Self-pay | Admitting: *Deleted

## 2014-05-25 NOTE — Telephone Encounter (Signed)
"  [facility] is the name of the company.  The fax number is [phone].  They gave me other names but this is the only place that will rent it out."  I told her I would see what I could do.  "Okay, thank you."  I called [facility] and they said to fax over a prescription along with demographics and insurance.  They will check her insurance and call her to inform of delivery.  It could take up to 5 business days, not including weekends to process.  [facility] demographics [facility].

## 2014-05-25 NOTE — Telephone Encounter (Signed)
"  Thank You"  I called the patient and apologized for the delay in calling her back. She stated, "Oh it's no problem.  I was calling because I need a prescription for a knee scooter.  My insurance said they would pay for it but I need a prescription."  I told her I [facility].  "My insurance gave me the name of some places but it's on my phone and I am at work.  Can I call you back with the names of the places?"  I told her that is fine, when you call just ask to speak to me.

## 2014-06-14 ENCOUNTER — Telehealth: Payer: Self-pay | Admitting: *Deleted

## 2014-06-14 NOTE — Telephone Encounter (Signed)
She will be fine to stop taking the meloxicam.

## 2014-06-14 NOTE — Telephone Encounter (Signed)
"  I'm scheduled to have surgery on Friday.  Do I need to stop taking the anti-inflammatory, Meloxicam?"  I told her I would have to ask Dr. Al CorpusHyatt and call her back.

## 2014-06-14 NOTE — Telephone Encounter (Signed)
I called and informed her that Dr. Al CorpusHyatt said it is okay to stop the Meloxicam.  "Okay, thank you."

## 2014-06-15 ENCOUNTER — Telehealth: Payer: Self-pay | Admitting: *Deleted

## 2014-06-15 NOTE — Telephone Encounter (Signed)
Pt left no message.  I spoke with pt she states the FMLA paperwork needs completion because her surgery is Friday.

## 2014-06-15 NOTE — Telephone Encounter (Signed)
I have completed all the clinical requirements for this patient, and put it in your folder

## 2014-06-16 ENCOUNTER — Other Ambulatory Visit: Payer: Self-pay | Admitting: Podiatry

## 2014-06-16 MED ORDER — CEPHALEXIN 500 MG PO CAPS: 500.0000 mg | ORAL_CAPSULE | Freq: Three times a day (TID) | ORAL | Status: DC

## 2014-06-16 MED ORDER — OXYCODONE-ACETAMINOPHEN 10-325 MG PO TABS: ORAL_TABLET | ORAL | Status: DC

## 2014-06-16 MED ORDER — PROMETHAZINE HCL 25 MG PO TABS: 25.0000 mg | ORAL_TABLET | Freq: Three times a day (TID) | ORAL | Status: DC | PRN

## 2014-06-17 ENCOUNTER — Encounter: Payer: Self-pay | Admitting: Podiatry

## 2014-06-17 DIAGNOSIS — M722 Plantar fascial fibromatosis: Secondary | ICD-10-CM

## 2014-06-17 DIAGNOSIS — M76822 Posterior tibial tendinitis, left leg: Secondary | ICD-10-CM

## 2014-06-17 DIAGNOSIS — M2012 Hallux valgus (acquired), left foot: Secondary | ICD-10-CM

## 2014-06-17 DIAGNOSIS — M2040 Other hammer toe(s) (acquired), unspecified foot: Secondary | ICD-10-CM

## 2014-06-23 ENCOUNTER — Ambulatory Visit (INDEPENDENT_AMBULATORY_CARE_PROVIDER_SITE_OTHER): Payer: 59

## 2014-06-23 ENCOUNTER — Ambulatory Visit: Payer: 59 | Admitting: Podiatry

## 2014-06-23 ENCOUNTER — Encounter: Payer: 59 | Admitting: Podiatry

## 2014-06-23 VITALS — BP 112/78 | HR 66 | Temp 98.2°F | Resp 17

## 2014-06-23 DIAGNOSIS — Z9889 Other specified postprocedural states: Secondary | ICD-10-CM

## 2014-06-23 NOTE — Progress Notes (Signed)
Dr Al CorpusHyatt performed a left [name] bunionectomy, left EPF and left Kidner advanced tendon surgery on 06/17/14

## 2014-06-23 NOTE — Progress Notes (Signed)
   Subjective:    Patient ID: Linda Mckinney, female    DOB: July 03, 1986, 28 y.o.   MRN: [id number]  HPI Pt presents for first post op Austin bunion repair and kidner. States that she is doing well, cast feels comfortable, color is pink, toes are warm. States she is managing pain effectivley with ibuprofen and percocet at night. Afebrile   Review of Systems     Objective:   Physical Exam        Assessment & Plan:

## 2014-06-30 ENCOUNTER — Ambulatory Visit (INDEPENDENT_AMBULATORY_CARE_PROVIDER_SITE_OTHER): Payer: 59 | Admitting: Podiatry

## 2014-06-30 DIAGNOSIS — Z9889 Other specified postprocedural states: Secondary | ICD-10-CM

## 2014-07-02 NOTE — Progress Notes (Signed)
She presents today for follow-up of posterior tibial tendon repair left and also bunion repair left and an IPJ fusion hallux left. She denies fever chills nausea vomiting muscle aches and pains. States that she is tired of the cast.  Objective: Vital signs are stable she is alert and oriented 3. There is no erythema edema cellulitis drainage or odor once the cast was removed. Dry sterile dressing remained intact was removed demonstrated nice clean surgical site. Sutures are in place margins appear to be well coapted she has good range of motion of the toe.  Assessment: Well-healed surgical foot left.  Plan: Redressed today with a compression dressing and then placed her in a below-knee cast color pink. She will remain nonweightbearing for the next 2 weeks. I will follow-up with her at that time for removal of cast. I did encourage her to increase her range of motion activity with her great toe. Radiographs will be taken into weeks.

## 2014-07-19 ENCOUNTER — Ambulatory Visit (INDEPENDENT_AMBULATORY_CARE_PROVIDER_SITE_OTHER): Payer: 59

## 2014-07-19 ENCOUNTER — Ambulatory Visit (INDEPENDENT_AMBULATORY_CARE_PROVIDER_SITE_OTHER): Payer: 59 | Admitting: Podiatry

## 2014-07-19 ENCOUNTER — Ambulatory Visit: Payer: 59 | Admitting: Podiatry

## 2014-07-19 DIAGNOSIS — Z9889 Other specified postprocedural states: Secondary | ICD-10-CM

## 2014-07-19 NOTE — Progress Notes (Signed)
Subjective:     Patient ID: Linda Mckinney, female   DOB: 09/27/1985, 29 y.o.   MRN: [id number]  HPI patient presents for removal of staples left and removal of hard fiberglass cast left with application of air fracture walker. Patient is a approximate 5 weeks postop surgery Dr. Al CorpusHyatt   Review of Systems     Objective:   Physical Exam Upon removal of cast incision sites are healing well with wound edges well coapted and good alignment of the first MPJ with staples in place in the left midfoot    Assessment:     Doing well post foot reconstruction left    Plan:     X-rays reviewed with patient and staples removed. Applied sterile dressing and then applied air fracture walker with instructions on continued nonweightbearing for 2 more weeks were she will be reevaluated by Dr. Al CorpusHyatt. Encouraged to call with any questions prior to that time

## 2014-08-02 ENCOUNTER — Ambulatory Visit (INDEPENDENT_AMBULATORY_CARE_PROVIDER_SITE_OTHER): Payer: 59 | Admitting: Podiatry

## 2014-08-02 ENCOUNTER — Encounter: Payer: Self-pay | Admitting: Podiatry

## 2014-08-02 ENCOUNTER — Ambulatory Visit (INDEPENDENT_AMBULATORY_CARE_PROVIDER_SITE_OTHER): Payer: 59

## 2014-08-02 VITALS — BP 120/84 | HR 92 | Resp 12

## 2014-08-02 DIAGNOSIS — M2012 Hallux valgus (acquired), left foot: Secondary | ICD-10-CM

## 2014-08-02 DIAGNOSIS — Z9889 Other specified postprocedural states: Secondary | ICD-10-CM

## 2014-08-02 NOTE — Progress Notes (Signed)
She presents today almost 6 weeks status post EPF left Kidner procedure left and a bunion repair left. She states that she seems to be doing pretty good but somewhat swollen. She continues to present as a nonweightbearing status with her knee scooter.  Objective: Linda Mckinney is intact to the left lower extremity was removed demonstrates mild edema no erythema cellulitis drainage or odor she has no pain on palpation of the EPF site or the posterior tibial tendon repair site. Mild tenderness on range of motion of the first metatarsophalangeal joint and at the IP J fusion site. Radiographs confirm good arthrodesis.  Assessment well-healing surgical foot left.  Plan: Compression anklet was applied today with a tri-lock brace and a Darco shoe I will allow her to start walking on this foot and I will follow-up with her in 2-3 weeks.

## 2014-08-04 ENCOUNTER — Telehealth: Payer: Self-pay | Admitting: *Deleted

## 2014-08-04 NOTE — Telephone Encounter (Signed)
My company needs you to fax a notice to [phone] telling them I when I will be returning to work.

## 2014-08-08 ENCOUNTER — Telehealth: Payer: Self-pay | Admitting: *Deleted

## 2014-08-08 NOTE — Telephone Encounter (Signed)
Amy, can you please call this patient and let her know that we will need a written request faxed from her employer to fill out, stating her return to work date

## 2014-08-08 NOTE — Telephone Encounter (Signed)
Pt left her name, birth date and phone number.

## 2014-08-11 ENCOUNTER — Telehealth: Payer: Self-pay | Admitting: *Deleted

## 2014-08-11 NOTE — Telephone Encounter (Signed)
Pt states her FMLA company states they need a note stating she is to be out of work until evaluated by Dr. Al CorpusHyatt 08/16/2014.  Note faxed to [phone].

## 2014-08-16 ENCOUNTER — Ambulatory Visit (INDEPENDENT_AMBULATORY_CARE_PROVIDER_SITE_OTHER): Payer: 59

## 2014-08-16 ENCOUNTER — Ambulatory Visit (INDEPENDENT_AMBULATORY_CARE_PROVIDER_SITE_OTHER): Payer: 59 | Admitting: Podiatry

## 2014-08-16 ENCOUNTER — Encounter: Payer: Self-pay | Admitting: Podiatry

## 2014-08-16 DIAGNOSIS — M2012 Hallux valgus (acquired), left foot: Secondary | ICD-10-CM

## 2014-08-16 DIAGNOSIS — M21612 Bunion of left foot: Secondary | ICD-10-CM

## 2014-08-16 DIAGNOSIS — Z9889 Other specified postprocedural states: Secondary | ICD-10-CM

## 2014-08-16 NOTE — Progress Notes (Signed)
She presents today for a postop evaluation. Date of surgery was 06/17/2014. She states that she just started walking on the foot last Sunday. She states that she has severe pain in her medial longitudinal arch as she points to this area. She denies fever chills nausea vomiting muscle aches and pains.  Objective: Vital signs are stable she is alert and oriented 3. Her left foot is moderately edematous and the incision sites appear to be healing well. She is status post Kidner posterior tibial tendon advancement and endoscopic plantar fasciotomy left a bunion repair left and an IPJ fusion left she has good range of motion at the first metatarsophalangeal joint but tender to the hallux as a whole. She has tenderness on inversion of the subtalar joint along the posterior tibial tendon. She also has pain on palpation of the medial band of the plantar fascia from its insertion site. Radiographic evaluation confirms well-healing osseous structures however considerable swelling of the soft tissues.  Assessment: Slow healing surgical foot left because of the recent pain associated with gait more than likely we will have to consider physical therapy.  Plan: Discussed etiology pathology conservative versus surgical therapies. At this point I suggest she remain out of work for another 6-8 weeks until we've completely rehabbed. I'm encouraging her to wear her tennis shoes with her orthotics. I will reevaluate her in the next 2 weeks and consider physical therapy at that time.

## 2014-09-06 ENCOUNTER — Encounter: Payer: Self-pay | Admitting: Podiatry

## 2014-09-06 ENCOUNTER — Ambulatory Visit (INDEPENDENT_AMBULATORY_CARE_PROVIDER_SITE_OTHER): Payer: 59

## 2014-09-06 ENCOUNTER — Ambulatory Visit (INDEPENDENT_AMBULATORY_CARE_PROVIDER_SITE_OTHER): Payer: 59 | Admitting: Podiatry

## 2014-09-06 VITALS — BP 148/95 | HR 87 | Resp 16

## 2014-09-06 DIAGNOSIS — Z9889 Other specified postprocedural states: Secondary | ICD-10-CM

## 2014-09-06 NOTE — Progress Notes (Signed)
She presents today for follow-up of her [facility] b[facility] repair Kidner posterior tibial tendon advancement and an endoscopic plantar fasciotomy. She states that is still moderately painful but she is currently wearing her tennis shoes. She wears her brace on occasion. She also states that her ability to ambulate a short lived and cannot make it through a shopping trip without having to sit down.  Objective: Vital signs are stable she is alert and oriented 3. Pulses are palpable to the left foot. She has some tenderness on palpation of the posterior tibial tendon insertion site and along the incision site. However she has great range of motion of the first metatarsophalangeal joint and minimal tenderness on palpation of the left heel. Radiographs confirm good alignment of the first metatarsal osteotomy and the anchor is intact to the navicular bone left.  Assessment: Well-healing surgical foot left.  Plan: Encouraged physical therapy and demonstrated some physical therapy with her today also provided her with instructions for physical therapy. We will follow-up with her in 1 month prior to releasing her for work. I encouraged her to continue to wear her Tri-Lock brace and to ambulate as much as possible. She is to continue to ice and elevate when sitting.

## 2014-10-06 ENCOUNTER — Encounter: Payer: Self-pay | Admitting: Podiatry

## 2014-10-06 ENCOUNTER — Ambulatory Visit (INDEPENDENT_AMBULATORY_CARE_PROVIDER_SITE_OTHER): Payer: 59

## 2014-10-06 ENCOUNTER — Ambulatory Visit (INDEPENDENT_AMBULATORY_CARE_PROVIDER_SITE_OTHER): Payer: 59 | Admitting: Podiatry

## 2014-10-06 VITALS — BP 140/88 | HR 81

## 2014-10-06 DIAGNOSIS — M2012 Hallux valgus (acquired), left foot: Secondary | ICD-10-CM | POA: Diagnosis not present

## 2014-10-06 MED ORDER — MELOXICAM 15 MG PO TABS: 15.0000 mg | ORAL_TABLET | Freq: Every day | ORAL | Status: DC

## 2014-10-06 NOTE — Patient Instructions (Signed)

## 2014-10-06 NOTE — Progress Notes (Signed)
   Subjective:    Patient ID: Linda Mckinney, female    DOB: 04-14-1986, 29 y.o.   MRN: [id number]  HPI  DOS 06-17-2014 Linda Mckinney bunionectomy, EPF, and Kidner left foot     PUO Review of Systems  All other systems reviewed and are negative.      Objective:   Physical Exam vital signs are stable she is alert and oriented 3. Pulses are palpable left foot. She has no pain on range of motion of the first metatarsophalangeal joint 5 out of 5 muscle strength of the posterior tibial tendon left. She has mild tenderness on palpation of the plantar fasciotomy site left. No edema to the foot radiographic evaluation demonstrates well-healed mallet toe fusion.     Assessment & Plan:  Assessment: Well-healing surgical foot.  Plan: Dispensed orthotics today. She'll wear these as much as possible and I will follow-up with her 1 month.

## 2014-10-12 DIAGNOSIS — R52 Pain, unspecified: Secondary | ICD-10-CM

## 2014-11-03 ENCOUNTER — Ambulatory Visit (INDEPENDENT_AMBULATORY_CARE_PROVIDER_SITE_OTHER): Payer: 59

## 2014-11-03 ENCOUNTER — Encounter: Payer: Self-pay | Admitting: Podiatry

## 2014-11-03 ENCOUNTER — Ambulatory Visit (INDEPENDENT_AMBULATORY_CARE_PROVIDER_SITE_OTHER): Payer: 59 | Admitting: Podiatry

## 2014-11-03 VITALS — BP 132/81 | HR 83 | Resp 16

## 2014-11-03 DIAGNOSIS — M722 Plantar fascial fibromatosis: Secondary | ICD-10-CM | POA: Diagnosis not present

## 2014-11-03 DIAGNOSIS — M2012 Hallux valgus (acquired), left foot: Secondary | ICD-10-CM | POA: Diagnosis not present

## 2014-11-03 DIAGNOSIS — Z9889 Other specified postprocedural states: Secondary | ICD-10-CM | POA: Diagnosis not present

## 2014-11-03 MED ORDER — METHYLPREDNISOLONE 4 MG PO TBPK: ORAL_TABLET | ORAL | Status: DC

## 2014-11-03 NOTE — Progress Notes (Signed)
She presents today for a chief complaint of painful left heel. She denies any trauma to the surgical foot however the left heel is starting to bother her.  Objective: Vital signs are stable she is alert and oriented 3 she has pain on palpation medially of the left heel pulses remain palpable right  Evaluation and x-rays well-healing surgical foot distal medial aspect however she has pain along the plantar fascial insertion site of the left heel this is consistent with plantar fasciitis as is the swelling of the soft tissue increased density of the plantar fascial insertion site her radiographs.  Assessment plantar fasciitis left foot.  Plan: Injected left heel today placed her on a Medrol Dosepak to be followed by meloxicam. Plantar fascial strapping and night splint. Discussed proper shoe gear stretcher sizes nicely sugar modifications. Will follow up with her in 1 month

## 2014-12-01 ENCOUNTER — Ambulatory Visit (INDEPENDENT_AMBULATORY_CARE_PROVIDER_SITE_OTHER): Payer: 59 | Admitting: Podiatry

## 2014-12-01 ENCOUNTER — Encounter: Payer: Self-pay | Admitting: Podiatry

## 2014-12-01 ENCOUNTER — Ambulatory Visit (INDEPENDENT_AMBULATORY_CARE_PROVIDER_SITE_OTHER): Payer: 59

## 2014-12-01 VITALS — BP 138/82 | HR 77 | Resp 16

## 2014-12-01 DIAGNOSIS — M65272 Calcific tendinitis, left ankle and foot: Secondary | ICD-10-CM

## 2014-12-01 DIAGNOSIS — M2012 Hallux valgus (acquired), left foot: Secondary | ICD-10-CM | POA: Diagnosis not present

## 2014-12-01 DIAGNOSIS — Z9889 Other specified postprocedural states: Secondary | ICD-10-CM

## 2014-12-03 NOTE — Progress Notes (Signed)
She presents today several months now status post hallux IPJ fusion with bunion repair. Kidner procedure and an endoscopic plantar fasciotomy. She states that all of the rear foot seems to be doing fine her orthotics seem to be working fine she has the majority of her pain at the first metatarsophalangeal joint. She states that it only bothers her when she is working the days that she has off from work her foot does not hurt. She denies any trauma fever chills nausea vomiting muscle aches or pains or any medication.  Objective: Vital signs are stable she is alert and oriented 3. No pain on palpation to the midfoot and rear foot. She does have tenderness on palpation and range of motion of the first metatarsophalangeal joint. Radiograph confirms no osseous abnormalities to this area. There is minimal edema no erythema saline drainage or odor.  Assessment: Well-healing surgical foot with probable capsulitis Rule out gout because of the hydrochlorothiazide but this really does not work with the timeline.  Plan: She is to continue to wear her regular shoe gear we are going to cut back on the number of hours at work. We will also consider removing the internal fixation if necessary.

## 2014-12-20 ENCOUNTER — Ambulatory Visit: Payer: 59 | Admitting: Podiatry

## 2015-01-03 ENCOUNTER — Encounter: Payer: Self-pay | Admitting: Podiatry

## 2015-01-03 ENCOUNTER — Ambulatory Visit (INDEPENDENT_AMBULATORY_CARE_PROVIDER_SITE_OTHER): Payer: 59 | Admitting: Podiatry

## 2015-01-03 VITALS — BP 203/106 | HR 70 | Resp 16

## 2015-01-03 DIAGNOSIS — M779 Enthesopathy, unspecified: Secondary | ICD-10-CM | POA: Diagnosis not present

## 2015-01-03 DIAGNOSIS — M76822 Posterior tibial tendinitis, left leg: Secondary | ICD-10-CM

## 2015-01-03 DIAGNOSIS — Q6652 Congenital pes planus, left foot: Secondary | ICD-10-CM | POA: Diagnosis not present

## 2015-01-04 NOTE — Progress Notes (Signed)
She presents today for follow-up of pain to her left foot. She states that after resting the foot to some degree and getting in a parachuted she's feeling much better. She's also been wearing her Cam Walker and her Darco shoe.  Objective: Vital signs are stable she is alert and oriented 3. Surgeries performed last year consist of a Kidner procedure hallux IPJ fusion and an endoscopic plantar fasciotomy of the left foot. All of these points are examined today and are pain-free.  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  Plan: Continue to utilize her tennis shoes with orthotics. I will follow-up with her as needed.

## 2015-03-07 ENCOUNTER — Encounter: Payer: Self-pay | Admitting: Podiatry

## 2015-03-09 ENCOUNTER — Encounter: Payer: Self-pay | Admitting: Podiatry

## 2015-03-09 ENCOUNTER — Ambulatory Visit (INDEPENDENT_AMBULATORY_CARE_PROVIDER_SITE_OTHER): Payer: 59 | Admitting: Podiatry

## 2015-03-09 VITALS — BP 133/85 | HR 70 | Resp 16

## 2015-03-09 DIAGNOSIS — M722 Plantar fascial fibromatosis: Secondary | ICD-10-CM | POA: Diagnosis not present

## 2015-03-09 DIAGNOSIS — L600 Ingrowing nail: Secondary | ICD-10-CM

## 2015-03-09 MED ORDER — NEOMYCIN-POLYMYXIN-HC 3.5-10000-1 OT SOLN: OTIC | Status: DC

## 2015-03-09 NOTE — Patient Instructions (Signed)
Betadine Soak Instructions  Purchase an 8 oz. bottle of BETADINE solution (Povidone)  THE DAY AFTER THE PROCEDURE  Place 1 tablespoon of betadine solution in a quart of warm tap water.  Submerge your foot or feet with outer bandage intact for the initial soak; this will allow the bandage to become moist and wet for easy lift off.  Once you remove your bandage, continue to soak in the solution for 20 minutes.  This soak should be done twice a day.  Next, remove your foot or feet from solution, blot dry the affected area and cover.  You may use a band aid large enough to cover the area or use gauze and tape.  Apply other medications to the area as directed by the doctor such as cortisporin otic solution (ear drops) or neosporin.  IF YOUR SKIN BECOMES IRRITATED WHILE USING THESE INSTRUCTIONS, IT IS OKAY TO SWITCH TO EPSOM SALTS AND WATER OR WHITE VINEGAR AND WATER.   Long Term Care Instructions-Post Nail Surgery  You have had your ingrown toenail and root treated with a chemical.  This chemical causes a burn that will drain and ooze like a blister.  This can drain for 6-8 weeks or longer.  It is important to keep this area clean, covered, and follow the soaking instructions dispensed at the time of your surgery.  This area will eventually dry and form a scab.  Once the scab forms you no longer need to soak or apply a dressing.  If at any time you experience an increase in pain, redness, swelling, or drainage, you should contact the office as soon as possible.    Plantar Fasciitis (Heel Spur Syndrome) with Rehab The plantar fascia is a fibrous, ligament-like, soft-tissue structure that spans the bottom of the foot. Plantar fasciitis is a condition that causes pain in the foot due to inflammation of the tissue. SYMPTOMS   Pain and tenderness on the underneath side of the foot.  Pain that worsens with standing or walking. CAUSES  Plantar fasciitis is caused by irritation and injury to the plantar  fascia on the underneath side of the foot. Common mechanisms of injury include:  Direct trauma to bottom of the foot.  Damage to a small nerve that runs under the foot where the main fascia attaches to the heel bone.  Stress placed on the plantar fascia due to bone spurs. RISK INCREASES WITH:   Activities that place stress on the plantar fascia (running, jumping, pivoting, or cutting).  Poor strength and flexibility.  Improperly fitted shoes.  Tight calf muscles.  Flat feet.  Failure to warm-up properly before activity.  Obesity. PREVENTION  Warm up and stretch properly before activity.  Allow for adequate recovery between workouts.  Maintain physical fitness:  Strength, flexibility, and endurance.  Cardiovascular fitness.  Maintain a health body weight.  Avoid stress on the plantar fascia.  Wear properly fitted shoes, including arch supports for individuals who have flat feet.  PROGNOSIS  If treated properly, then the symptoms of plantar fasciitis usually resolve without surgery. However, occasionally surgery is necessary.  RELATED COMPLICATIONS   Recurrent symptoms that may result in a chronic condition.  Problems of the lower back that are caused by compensating for the injury, such as limping.  Pain or weakness of the foot during push-off following surgery.  Chronic inflammation, scarring, and partial or complete fascia tear, occurring more often from repeated injections.  TREATMENT  Treatment initially involves the use of ice and medication to   help reduce pain and inflammation. The use of strengthening and stretching exercises may help reduce pain with activity, especially stretches of the Achilles tendon. These exercises may be performed at home or with a therapist. Your caregiver may recommend that you use heel cups of arch supports to help reduce stress on the plantar fascia. Occasionally, corticosteroid injections are given to reduce inflammation. If  symptoms persist for greater than 6 months despite non-surgical (conservative), then surgery may be recommended.   MEDICATION   If pain medication is necessary, then nonsteroidal anti-inflammatory medications, such as aspirin and ibuprofen, or other minor pain relievers, such as acetaminophen, are often recommended.  Do not take pain medication within 7 days before surgery.  Prescription pain relievers may be given if deemed necessary by your caregiver. Use only as directed and only as much as you need.  Corticosteroid injections may be given by your caregiver. These injections should be reserved for the most serious cases, because they may only be given a certain number of times.  HEAT AND COLD  Cold treatment (icing) relieves pain and reduces inflammation. Cold treatment should be applied for 10 to 15 minutes every 2 to 3 hours for inflammation and pain and immediately after any activity that aggravates your symptoms. Use ice packs or massage the area with a piece of ice (ice massage).  Heat treatment may be used prior to performing the stretching and strengthening activities prescribed by your caregiver, physical therapist, or athletic trainer. Use a heat pack or soak the injury in warm water.  SEEK IMMEDIATE MEDICAL CARE IF:  Treatment seems to offer no benefit, or the condition worsens.  Any medications produce adverse side effects.  EXERCISES- RANGE OF MOTION (ROM) AND STRETCHING EXERCISES - Plantar Fasciitis (Heel Spur Syndrome) These exercises may help you when beginning to rehabilitate your injury. Your symptoms may resolve with or without further involvement from your physician, physical therapist or athletic trainer. While completing these exercises, remember:   Restoring tissue flexibility helps normal motion to return to the joints. This allows healthier, less painful movement and activity.  An effective stretch should be held for at least 30 seconds.  A stretch should  never be painful. You should only feel a gentle lengthening or release in the stretched tissue.  RANGE OF MOTION - Toe Extension, Flexion  Sit with your right / left leg crossed over your opposite knee.  Grasp your toes and gently pull them back toward the top of your foot. You should feel a stretch on the bottom of your toes and/or foot.  Hold this stretch for 10 seconds.  Now, gently pull your toes toward the bottom of your foot. You should feel a stretch on the top of your toes and or foot.  Hold this stretch for 10 seconds. Repeat  times. Complete this stretch 3 times per day.   RANGE OF MOTION - Ankle Dorsiflexion, Active Assisted  Remove shoes and sit on a chair that is preferably not on a carpeted surface.  Place right / left foot under knee. Extend your opposite leg for support.  Keeping your heel down, slide your right / left foot back toward the chair until you feel a stretch at your ankle or calf. If you do not feel a stretch, slide your bottom forward to the edge of the chair, while still keeping your heel down.  Hold this stretch for 10 seconds. Repeat 3 times. Complete this stretch 2 times per day.   STRETCH  Gastroc, Standing    Place hands on wall.  Extend right / left leg, keeping the front knee somewhat bent.  Slightly point your toes inward on your back foot.  Keeping your right / left heel on the floor and your knee straight, shift your weight toward the wall, not allowing your back to arch.  You should feel a gentle stretch in the right / left calf. Hold this position for 10 seconds. Repeat 3 times. Complete this stretch 2 times per day.  STRETCH  Soleus, Standing  Place hands on wall.  Extend right / left leg, keeping the other knee somewhat bent.  Slightly point your toes inward on your back foot.  Keep your right / left heel on the floor, bend your back knee, and slightly shift your weight over the back leg so that you feel a gentle stretch deep in  your back calf.  Hold this position for 10 seconds. Repeat 3 times. Complete this stretch 2 times per day.  STRETCH  Gastrocsoleus, Standing  Note: This exercise can place a lot of stress on your foot and ankle. Please complete this exercise only if specifically instructed by your caregiver.   Place the ball of your right / left foot on a step, keeping your other foot firmly on the same step.  Hold on to the wall or a rail for balance.  Slowly lift your other foot, allowing your body weight to press your heel down over the edge of the step.  You should feel a stretch in your right / left calf.  Hold this position for 10 seconds.  Repeat this exercise with a slight bend in your right / left knee. Repeat 3 times. Complete this stretch 2 times per day.   STRENGTHENING EXERCISES - Plantar Fasciitis (Heel Spur Syndrome)  These exercises may help you when beginning to rehabilitate your injury. They may resolve your symptoms with or without further involvement from your physician, physical therapist or athletic trainer. While completing these exercises, remember:   Muscles can gain both the endurance and the strength needed for everyday activities through controlled exercises.  Complete these exercises as instructed by your physician, physical therapist or athletic trainer. Progress the resistance and repetitions only as guided.  STRENGTH - Towel Curls  Sit in a chair positioned on a non-carpeted surface.  Place your foot on a towel, keeping your heel on the floor.  Pull the towel toward your heel by only curling your toes. Keep your heel on the floor. Repeat 3 times. Complete this exercise 2 times per day.  STRENGTH - Ankle Inversion  Secure one end of a rubber exercise band/tubing to a fixed object (table, pole). Loop the other end around your foot just before your toes.  Place your fists between your knees. This will focus your strengthening at your ankle.  Slowly, pull your  big toe up and in, making sure the band/tubing is positioned to resist the entire motion.  Hold this position for 10 seconds.  Have your muscles resist the band/tubing as it slowly pulls your foot back to the starting position. Repeat 3 times. Complete this exercises 2 times per day.  Document Released: 07/01/2005 Document Revised: 09/23/2011 Document Reviewed: 10/13/2008 Exit[employer] Patient Information 2014 [employer].   

## 2015-03-10 NOTE — Progress Notes (Signed)
She presents today with chief complaint of ingrown nail hallux left. States that it started passing up 2 weeks ago after a pedicure.    objective : vital signs are stable she is alert and oriented 3 sharp incurvated nail margin along the fibular border with erythema and edema and gross granulation tissue. Pulses are strongly palpable. Neurologic sensorium is intact.   assessment: ingrown nail paronychia abscess hallux left.  Plan: chemical matrixectomy was performed after local anesthesia was administered.  The nail margin was avulsed and the matrixectomy was performed. She tolerated this procedure well and will start soaking twice daily in Betadine warm water and apply Cortisporin otic which a prescription was scribed for today. I will follow-up with her in 1 week.    Arbutus Ped DPM

## 2015-03-23 ENCOUNTER — Encounter: Payer: Self-pay | Admitting: Podiatry

## 2015-03-23 ENCOUNTER — Ambulatory Visit (INDEPENDENT_AMBULATORY_CARE_PROVIDER_SITE_OTHER): Payer: 59 | Admitting: Podiatry

## 2015-03-23 DIAGNOSIS — L600 Ingrowing nail: Secondary | ICD-10-CM | POA: Diagnosis not present

## 2015-03-23 NOTE — Progress Notes (Signed)
She presents today for follow-up of her matrixectomy to the tibial and fibular border hallux left. She continues to soak in  Betadine warm water and apply Cortisporin otic twice daily.    objective evaluation demonstrates supple well-hydrated cutis no erythema edema cellulitis drainage or odor well-healing surgical margins hallux left.   Assessment: Well-healing surgical timeout hallux left.  Plan: discontinue Betadine Stow with Epsom salts and warm water soaks covered in the daily that night. Continue soaks until completely resolved.

## 2015-03-23 NOTE — Patient Instructions (Signed)

## 2015-07-18 ENCOUNTER — Encounter: Payer: Self-pay | Admitting: Primary Care

## 2015-07-18 ENCOUNTER — Ambulatory Visit (INDEPENDENT_AMBULATORY_CARE_PROVIDER_SITE_OTHER): Payer: 59 | Admitting: Primary Care

## 2015-07-18 VITALS — BP 118/86 | HR 73 | Temp 97.3°F | Ht 63.0 in | Wt 218.1 lb

## 2015-07-18 DIAGNOSIS — Z6841 Body Mass Index (BMI) 40.0 and over, adult: Secondary | ICD-10-CM

## 2015-07-18 DIAGNOSIS — I10 Essential (primary) hypertension: Secondary | ICD-10-CM | POA: Diagnosis not present

## 2015-07-18 DIAGNOSIS — E669 Obesity, unspecified: Secondary | ICD-10-CM | POA: Diagnosis not present

## 2015-07-18 MED ORDER — AMLODIPINE BESYLATE 10 MG PO TABS: 10.0000 mg | ORAL_TABLET | Freq: Every day | ORAL | Status: DC

## 2015-07-18 MED FILL — AMLODIPINE BESYLATE 10 MG T: 10 | 90 days supply | Qty: 90 | Fill #0

## 2015-07-18 NOTE — Progress Notes (Signed)
Pre visit review using our clinic review tool, if applicable. No additional management support is needed unless otherwise documented below in the visit note. 

## 2015-07-18 NOTE — Assessment & Plan Note (Signed)
Diagnosed in high school, strong FH. Currently on benazepril/HCTZ but experiences muscle aches/cramping. Suspect this is due to HCTZ. Will stop current regimen and start Amlodipine.  Follow up in 2 weeks for re-evaluation. Will obtain records for recent BMP.

## 2015-07-18 NOTE — Progress Notes (Signed)
   Subjective:    Patient ID: Linda Mckinney, female    DOB: 05/16/1986, 30 y.o.   MRN: [id number]  HPI  Linda Mckinney is a 30 year old female who presents today to establish care and discuss the problems mentioned below. Will obtain old records. Her last physical was in 2016.  1) Essential Hypertension: Diagnosed in high school. Currently managed on benazepril-HCTZ 10/12.5 mg. She's been experiencing cramping to bilateral lower extremities for the past several months which causes her to skip doses due to discomfort. Denies chest pain. She does have headaches intermittently when skipping doses. When not on her medication her BP will run 130/100.   Review of Systems  Constitutional: Negative for unexpected weight change.  HENT: Negative for rhinorrhea.   Respiratory: Negative for cough and shortness of breath.   Cardiovascular: Negative for chest pain.  Gastrointestinal: Negative for diarrhea and constipation.  Genitourinary: Negative for difficulty urinating.       Regular periods. Completed GYN exam with pap in 2015.  Musculoskeletal: Negative for myalgias and arthralgias.  Skin: Negative for rash.  Neurological: Negative for dizziness, numbness and headaches.  Psychiatric/Behavioral:       Denies concerns for anxiety or depression       Past Medical History  Diagnosis Date  . Hypertension     Social History   Social History  . Marital Status: Single    Spouse Name: N/A  . Number of Children: N/A  . Years of Education: N/A   Occupational History  . Not on file.   Social History Main Topics  . Smoking status: Never Smoker   . Smokeless tobacco: Never Used  . Alcohol Use: No  . Drug Use: No  . Sexual Activity:    Partners: Male    Birth Control/ Protection: Condom   Other Topics Concern  . Not on file   Social History Narrative   Single.   No children.    Works as a LawyerCNA at [employer].   Enjoys relaxing.     Past Surgical History  Procedure  Laterality Date  . Cholecystectomy    . Bunionectomy  2015    Family History  Problem Relation Age of Onset  . Hypertension Mother   . Diabetes Father   . Stroke Father   . Heart disease Father   . Cancer Maternal Grandmother     breast    No Known Allergies  No current outpatient prescriptions on file prior to visit.   No current facility-administered medications on file prior to visit.    BP 118/86 mmHg  Pulse 73  Temp(Src) 97.3 F (36.3 C) (Oral)  Ht 5\' 3"  (1.6 m)  Wt 218 lb 1.9 oz (98.939 kg)  BMI 38.65 kg/m2  SpO2 99%  LMP 07/09/2015    Objective:   Physical Exam  Constitutional: She is oriented to person, place, and time. She appears well-nourished.  Cardiovascular: Normal rate and regular rhythm.   Pulmonary/Chest: Effort normal and breath sounds normal.  Neurological: She is alert and oriented to person, place, and time.  Skin: Skin is warm and dry.  Psychiatric: She has a normal mood and affect.          Assessment & Plan:

## 2015-07-18 NOTE — Patient Instructions (Signed)
Start Amlodipine 10 mg for high blood pressure. Take 1 tablet by mouth every morning.  Stop taking benazepril-HCTZ tablets for blood pressure as this is likely causing your cramping.  Follow up in 2 weeks for re-evaluation of blood pressure.  It was a pleasure to meet you today! Please don't hesitate to call me with any questions. Welcome to [facility]!

## 2015-07-18 NOTE — Assessment & Plan Note (Signed)
Discussed the importance of a healthy diet and regular exercise in order for weight loss and to reduce risk of other medical diseases. Currently involved in a group weight loss challenge through work. Will continue to monitor.

## 2015-07-26 DIAGNOSIS — M9901 Segmental and somatic dysfunction of cervical region: Secondary | ICD-10-CM | POA: Diagnosis not present

## 2015-07-26 DIAGNOSIS — M9902 Segmental and somatic dysfunction of thoracic region: Secondary | ICD-10-CM | POA: Diagnosis not present

## 2015-07-26 DIAGNOSIS — M6080 Other myositis, unspecified site: Secondary | ICD-10-CM | POA: Diagnosis not present

## 2015-07-26 DIAGNOSIS — M531 Cervicobrachial syndrome: Secondary | ICD-10-CM | POA: Diagnosis not present

## 2015-07-26 DIAGNOSIS — M62830 Muscle spasm of back: Secondary | ICD-10-CM | POA: Diagnosis not present

## 2015-07-26 DIAGNOSIS — M9905 Segmental and somatic dysfunction of pelvic region: Secondary | ICD-10-CM | POA: Diagnosis not present

## 2015-07-26 DIAGNOSIS — M545 Low back pain: Secondary | ICD-10-CM | POA: Diagnosis not present

## 2015-07-26 DIAGNOSIS — M461 Sacroiliitis, not elsewhere classified: Secondary | ICD-10-CM | POA: Diagnosis not present

## 2015-07-26 DIAGNOSIS — M955 Acquired deformity of pelvis: Secondary | ICD-10-CM | POA: Diagnosis not present

## 2015-08-02 ENCOUNTER — Ambulatory Visit: Payer: Self-pay | Admitting: Physician Assistant

## 2015-08-02 ENCOUNTER — Encounter: Payer: Self-pay | Admitting: Physician Assistant

## 2015-08-02 VITALS — BP 110/84 | HR 76 | Temp 98.7°F

## 2015-08-02 DIAGNOSIS — R52 Pain, unspecified: Secondary | ICD-10-CM

## 2015-08-02 DIAGNOSIS — J111 Influenza due to unidentified influenza virus with other respiratory manifestations: Secondary | ICD-10-CM

## 2015-08-02 LAB — POCT INFLUENZA A/B
INFLUENZA A, POC: POSITIVE — AB
INFLUENZA B, POC: NEGATIVE

## 2015-08-02 MED ORDER — OSELTAMIVIR PHOSPHATE 75 MG PO CAPS: 75.0000 mg | ORAL_CAPSULE | Freq: Two times a day (BID) | ORAL | Status: DC

## 2015-08-02 NOTE — Progress Notes (Signed)
S: c/o sore throat and body aches, ?fever/chills, no cp/sob/n/v/d; sx for 2 days, no otc meds, did get flu vaccine  O: vitals wnl, nad, ENT with injected throat, neck supple no lymph, lungs c t a, cv rrr flu swab with very faint +  A: influenza A  P: tamiflu  bid x 5d, no work until [employer] improve

## 2015-08-04 ENCOUNTER — Ambulatory Visit (INDEPENDENT_AMBULATORY_CARE_PROVIDER_SITE_OTHER): Payer: 59 | Admitting: Primary Care

## 2015-08-04 ENCOUNTER — Encounter: Payer: Self-pay | Admitting: Primary Care

## 2015-08-04 VITALS — BP 118/86 | HR 79 | Temp 98.3°F | Ht 63.0 in | Wt 224.4 lb

## 2015-08-04 DIAGNOSIS — I10 Essential (primary) hypertension: Secondary | ICD-10-CM

## 2015-08-04 LAB — BASIC METABOLIC PANEL
BUN: 13 mg/dL (ref 6–23)
CALCIUM: 9.2 mg/dL (ref 8.4–10.5)
CO2: 29 mEq/L (ref 19–32)
Chloride: 102 mEq/L (ref 96–112)
Creatinine, Ser: 0.9 mg/dL (ref 0.40–1.20)
GFR: 93.38 mL/min (ref >60.00)
Glucose, Bld: 100 mg/dL — ABNORMAL HIGH (ref 70–99)
Potassium: 4.2 mEq/L (ref 3.5–5.1)
SODIUM: 137 meq/L (ref 135–145)

## 2015-08-04 NOTE — Patient Instructions (Signed)
Complete lab work prior to leaving today. I will notify you of your results once received.   Please schedule a physical with me in 6 months. You may also schedule a lab only appointment 3-4 days prior. We will discuss your lab results in detail during your physical.  Please call me if your blood pressure runs below 100/60 or above 140/90.  It was a pleasure to see you today!

## 2015-08-04 NOTE — Progress Notes (Signed)
   Subjective:    Patient ID: Linda Mckinney, female    DOB: [birth year], 30 y.o.   MRN: [id number]  HPI  [name] is a 30 year old female who presents today for follow up of hypertension.  She was evaluated on 07/18/15 with elevated BP readings of 130/100. She was managed on benazepril-HCTZ 10/12.5 mg but had been experiencing cramping to her lower extremities. She was initiated on Amlopdipine 10 mg tablets last visit.  Since her last visit her BP is stable. Denies headaches, chest pain, cramping to lower extremities, dizziness. Mild swelling to ankles and is on her feet daily. She's started to improve her diet and is motivated to lose weight.  BP Readings from Last 3 Encounters:  08/04/15 118/86  08/02/15 110/84  07/18/15 118/86     Review of Systems  Respiratory: Negative for shortness of breath.   Cardiovascular: Negative for chest pain and leg swelling.       Mild ankle edema, although on feet for prolonged periods of time.  Neurological: Negative for dizziness.       Past Medical History  Diagnosis Date  . Hypertension     Social History   Social History  . Marital Status: Single    Spouse Name: N/A  . Number of Children: N/A  . Years of Education: N/A   Occupational History  . Not on file.   Social History Main Topics  . Smoking status: Never Smoker   . Smokeless tobacco: Never Used  . Alcohol Use: No  . Drug Use: No  . Sexual Activity:    Partners: Male    Birth Control/ Protection: Condom   Other Topics Concern  . Not on file   Social History Narrative   Single.   No children.    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   Enjoys relaxing.     Past Surgical History  Procedure Laterality Date  . Cholecystectomy    . Bunionectomy  2015    Family History  Problem Relation Age of Onset  . Hypertension Mother   . Diabetes Father   . Stroke Father   . Heart disease Father   . Cancer Maternal Grandmother     breast    No Known  Allergies  Current Outpatient Prescriptions on File Prior to Visit  Medication Sig Dispense Refill  . amLODipine (NORVASC) 10 MG tablet Take 1 tablet (10 mg total) by mouth daily. 30 tablet 2  . diclofenac (VOLTAREN) 75 MG EC tablet Take 75 mg by mouth as needed.   3  . oseltamivir (TAMIFLU) 75 MG capsule Take 1 capsule (75 mg total) by mouth 2 (two) times daily. 10 capsule 0   No current facility-administered medications on file prior to visit.    BP 118/86 mmHg  Pulse 79  Temp(Src) 98.3 F (36.8 C) (Oral)  Ht  (1.6 m)  Wt 224 lb 6.4 oz (101.787 kg)  BMI 39.76 kg/m2  SpO2 99%  LMP 07/09/2015    Objective:   Physical Exam  Constitutional: She appears well-nourished.  Cardiovascular: Normal rate and regular rhythm.   Pulmonary/Chest: Effort normal and breath sounds normal.  Skin: Skin is warm and dry.          Assessment & Plan:

## 2015-08-04 NOTE — Progress Notes (Signed)
Pre visit review using our clinic review tool, if applicable. No additional management support is needed unless otherwise documented below in the visit note. 

## 2015-08-04 NOTE — Addendum Note (Signed)
Addended by: Baldomero Lamy on: 08/04/2015 01:38 PM   Modules accepted: Kipp Brood

## 2015-08-04 NOTE — Assessment & Plan Note (Signed)
Improved on Amlodipine 10 mg. No cramping, dizziness, chest pain. Continue current regimen. BMP today.

## 2015-10-04 ENCOUNTER — Encounter: Payer: Self-pay | Admitting: Primary Care

## 2015-12-14 DIAGNOSIS — M9901 Segmental and somatic dysfunction of cervical region: Secondary | ICD-10-CM | POA: Diagnosis not present

## 2015-12-14 DIAGNOSIS — M9902 Segmental and somatic dysfunction of thoracic region: Secondary | ICD-10-CM | POA: Diagnosis not present

## 2015-12-14 DIAGNOSIS — M9905 Segmental and somatic dysfunction of pelvic region: Secondary | ICD-10-CM | POA: Diagnosis not present

## 2015-12-14 DIAGNOSIS — M545 Low back pain: Secondary | ICD-10-CM | POA: Diagnosis not present

## 2015-12-14 DIAGNOSIS — M955 Acquired deformity of pelvis: Secondary | ICD-10-CM | POA: Diagnosis not present

## 2015-12-14 DIAGNOSIS — M461 Sacroiliitis, not elsewhere classified: Secondary | ICD-10-CM | POA: Diagnosis not present

## 2015-12-14 DIAGNOSIS — M531 Cervicobrachial syndrome: Secondary | ICD-10-CM | POA: Diagnosis not present

## 2015-12-14 DIAGNOSIS — M62830 Muscle spasm of back: Secondary | ICD-10-CM | POA: Diagnosis not present

## 2015-12-14 DIAGNOSIS — M6080 Other myositis, unspecified site: Secondary | ICD-10-CM | POA: Diagnosis not present

## 2015-12-19 ENCOUNTER — Encounter: Payer: Self-pay | Admitting: Podiatry

## 2015-12-19 ENCOUNTER — Ambulatory Visit (INDEPENDENT_AMBULATORY_CARE_PROVIDER_SITE_OTHER): Payer: 59 | Admitting: Podiatry

## 2015-12-19 ENCOUNTER — Ambulatory Visit (INDEPENDENT_AMBULATORY_CARE_PROVIDER_SITE_OTHER): Payer: 59

## 2015-12-19 VITALS — BP 131/93 | HR 96 | Resp 16

## 2015-12-19 DIAGNOSIS — M76822 Posterior tibial tendinitis, left leg: Secondary | ICD-10-CM | POA: Diagnosis not present

## 2015-12-19 DIAGNOSIS — M79672 Pain in left foot: Secondary | ICD-10-CM

## 2015-12-19 DIAGNOSIS — M779 Enthesopathy, unspecified: Secondary | ICD-10-CM

## 2015-12-19 DIAGNOSIS — M7752 Other enthesopathy of left foot: Secondary | ICD-10-CM

## 2015-12-19 DIAGNOSIS — Q6652 Congenital pes planus, left foot: Secondary | ICD-10-CM

## 2015-12-19 DIAGNOSIS — M778 Other enthesopathies, not elsewhere classified: Secondary | ICD-10-CM

## 2015-12-19 NOTE — Progress Notes (Signed)
She presents today with a chief complaint of pain to the lateral lateral aspect of her left ankle as well as the medial and plantar medial aspect of her left foot. She states that she has stopped wearing her orthotics causes started to rub her raw.  Objective: Vital signs are stable she is alert and oriented 3. Pulses are strongly palpable left foot. She has no pain on palpation medial side of the foot however she does have pain on palpation of the sinus tarsi of the left foot and on in range of motion of the subtalar joint. Rate as taken today do demonstrate internal hardware first metatarsal phalangeal joint and the proximal phalanx. Also a corkscrew anchor to the navicular tuberosity.  Assessment: Sinus tarsitis capsulitis with pes planus left foot.  Plan: I injected the sinus tarsi today with Kenalog and local anesthetic. And I will follow-up with her as needed.

## 2016-01-01 ENCOUNTER — Other Ambulatory Visit: Payer: Self-pay | Admitting: Primary Care

## 2016-01-01 MED FILL — AMLODIPINE BESYLATE 10 MG T: 10 | 90 days supply | Qty: 90 | Fill #0

## 2016-01-10 ENCOUNTER — Other Ambulatory Visit: Payer: Self-pay | Admitting: Primary Care

## 2016-01-10 DIAGNOSIS — I10 Essential (primary) hypertension: Secondary | ICD-10-CM

## 2016-01-10 DIAGNOSIS — Z0000 Encounter for general adult medical examination without abnormal findings: Secondary | ICD-10-CM

## 2016-01-15 ENCOUNTER — Other Ambulatory Visit (INDEPENDENT_AMBULATORY_CARE_PROVIDER_SITE_OTHER): Payer: 59

## 2016-01-15 DIAGNOSIS — Z0000 Encounter for general adult medical examination without abnormal findings: Secondary | ICD-10-CM | POA: Diagnosis not present

## 2016-01-15 DIAGNOSIS — I10 Essential (primary) hypertension: Secondary | ICD-10-CM | POA: Diagnosis not present

## 2016-01-15 LAB — COMPREHENSIVE METABOLIC PANEL
ALT: 16 U/L (ref 0–35)
AST: 19 U/L (ref 0–37)
Albumin: 4 g/dL (ref 3.5–5.2)
Alkaline Phosphatase: 61 U/L (ref 39–117)
BUN: 17 mg/dL (ref 6–23)
CO2: 30 meq/L (ref 19–32)
Calcium: 9.3 mg/dL (ref 8.4–10.5)
Chloride: 103 mEq/L (ref 96–112)
Creatinine, Ser: 0.83 mg/dL (ref 0.40–1.20)
GFR: 102.24 mL/min (ref >60.00)
GLUCOSE: 107 mg/dL — AB (ref 70–99)
POTASSIUM: 3.9 meq/L (ref 3.5–5.1)
SODIUM: 139 meq/L (ref 135–145)
Total Bilirubin: 0.7 mg/dL (ref 0.2–1.2)
Total Protein: 7.3 g/dL (ref 6.0–8.3)

## 2016-01-15 LAB — TSH: TSH: 1.78 u[IU]/mL (ref 0.35–4.50)

## 2016-01-15 LAB — LIPID PANEL
Cholesterol: 151 mg/dL (ref 0–200)
HDL: 44 mg/dL (ref >39.00)
LDL Cholesterol: 99 mg/dL (ref 0–99)
NONHDL: 107.41
TRIGLYCERIDES: 41 mg/dL (ref 0.0–149.0)
Total CHOL/HDL Ratio: 3
VLDL: 8.2 mg/dL (ref 0.0–40.0)

## 2016-01-15 LAB — HEMOGLOBIN A1C: HEMOGLOBIN A1C: 5.7 % (ref 4.6–6.5)

## 2016-01-24 ENCOUNTER — Ambulatory Visit (INDEPENDENT_AMBULATORY_CARE_PROVIDER_SITE_OTHER): Payer: 59 | Admitting: Primary Care

## 2016-01-24 ENCOUNTER — Encounter: Payer: Self-pay | Admitting: Primary Care

## 2016-01-24 VITALS — BP 138/86 | HR 70 | Temp 98.2°F | Ht 62.5 in | Wt 228.0 lb

## 2016-01-24 DIAGNOSIS — Z23 Encounter for immunization: Secondary | ICD-10-CM

## 2016-01-24 DIAGNOSIS — E669 Obesity, unspecified: Secondary | ICD-10-CM

## 2016-01-24 DIAGNOSIS — Z0000 Encounter for general adult medical examination without abnormal findings: Secondary | ICD-10-CM | POA: Diagnosis not present

## 2016-01-24 DIAGNOSIS — Z0001 Encounter for general adult medical examination with abnormal findings: Secondary | ICD-10-CM | POA: Insufficient documentation

## 2016-01-24 DIAGNOSIS — I10 Essential (primary) hypertension: Secondary | ICD-10-CM | POA: Diagnosis not present

## 2016-01-24 MED ORDER — LOSARTAN POTASSIUM 50 MG PO TABS: 50.0000 mg | ORAL_TABLET | Freq: Every day | ORAL | Status: DC

## 2016-01-24 MED FILL — LOSARTAN POTASSIUM 50 MG TA: 50 | 30 days supply | Qty: 30 | Fill #0

## 2016-01-24 NOTE — Progress Notes (Signed)
Pre visit review using our clinic review tool, if applicable. No additional management support is needed unless otherwise documented below in the visit note. 

## 2016-01-24 NOTE — Addendum Note (Signed)
Addended by: Shon MilletWATLINGTON, Fontaine Hehl M on: 01/24/2016 08:46 AM   Modules accepted: Orders

## 2016-01-24 NOTE — Patient Instructions (Signed)
Stop Amlodipine tablets for blood pressure due to headaches.  Start Losartan 50 mg tablets for blood pressure. Take 1 tablet by mouth once daily everyday.   Check your blood pressure daily, around the same time of day, for the next 2 weeks.   Ensure that you have rested for 30 minutes prior to checking your blood pressure. Record your readings and I will e-mail you for those readings in 2 weeks.  You were provided with a tetanus vaccination which will cover you for 10 years.  Start exercising. You should be getting 1 hour of moderate intensity exercise 5 days weekly.  Ensure you are consuming 64 ounces of water daily.  Reduce consumption of candy, junk food, fast food. Increase vegetables, whole grains, lean protein, fruit.  Consider trying Zantac or Pepcid for acid reflux once your medication package is empty. Take a look at the information below regarding trigger foods.  Follow up in 1 year for repeat physical or sooner if needed.  It was a pleasure to see you today!  Food Choices for Gastroesophageal Reflux Disease, Adult When you have gastroesophageal reflux disease (GERD), the foods you eat and your eating habits are very important. Choosing the right foods can help ease the discomfort of GERD. WHAT GENERAL GUIDELINES DO I NEED TO FOLLOW?  Choose fruits, vegetables, whole grains, low-fat dairy products, and low-fat meat, fish, and poultry.  Limit fats such as oils, salad dressings, butter, nuts, and avocado.  Keep a food diary to identify foods that cause symptoms.  Avoid foods that cause reflux. These may be different for different people.  Eat frequent small meals instead of three large meals each day.  Eat your meals slowly, in a relaxed setting.  Limit fried foods.  Cook foods using methods other than frying.  Avoid drinking alcohol.  Avoid drinking large amounts of liquids with your meals.  Avoid bending over or lying down until 2-3 hours after eating. WHAT  FOODS ARE NOT RECOMMENDED? The following are some foods and drinks that may worsen your symptoms: Vegetables Tomatoes. Tomato juice. Tomato and spaghetti sauce. Chili peppers. Onion and garlic. Horseradish. Fruits Oranges, grapefruit, and lemon (fruit and juice). Meats High-fat meats, fish, and poultry. This includes hot dogs, ribs, ham, sausage, salami, and bacon. Dairy Whole milk and chocolate milk. Sour cream. Cream. Butter. Ice cream. Cream cheese.  Beverages Coffee and tea, with or without caffeine. Carbonated beverages or energy drinks. Condiments Hot sauce. Barbecue sauce.  Sweets/Desserts Chocolate and cocoa. Donuts. Peppermint and spearmint. Fats and Oils High-fat foods, including [place] fries and potato chips. Other Vinegar. Strong spices, such as black pepper, white pepper, red pepper, cayenne, curry powder, cloves, ginger, and chili powder. The items listed above may not be a complete list of foods and beverages to avoid. Contact your dietitian for more information.   This information is not intended to replace advice given to you by your health care provider. Make sure you discuss any questions you have with your health care provider.   Document Released: 07/01/2005 Document Revised: 07/22/2014 Document Reviewed: 05/05/2013 Elsevier Interactive Patient Education [employer]2016 Elsevier Inc.

## 2016-01-24 NOTE — Progress Notes (Signed)
Subjective:    Patient ID: Linda Mckinney, female    DOB: 11-27-1985, 30 y.o.   MRN: [id number]  HPI  [name] is a 30 year old female who presents today for complete physical.  Immunizations: -Tetanus: Unsure, believes it's been over 10 years.  -Influenza: Completed in Fall 2016  Diet: She endorses a fair diet. Breakfast: Skips, sometimes boiled egg Lunch: Skips most days Dinner: Restaurants, meat, vegetable, quinoa, pasta, fast food (several days weekly) Snacks: Candy, chips, vending machine food Desserts: Daily Beverages: Sparkling water, sweet tea, water  Exercise: She does not currently exercise Eye exam: Completed several years ago, no acute changes in vision Dental exam: Completes semi-annually Pap Smear: Completed in 2015, normal. Follows with GYN.   Review of Systems  Constitutional: Negative for unexpected weight change.  HENT: Negative for rhinorrhea.   Respiratory: Negative for cough and shortness of breath.   Cardiovascular: Negative for chest pain.  Gastrointestinal: Negative for diarrhea and constipation.       Fullness to throat, esophageal burning  Genitourinary: Negative for difficulty urinating and menstrual problem.  Musculoskeletal: Negative for myalgias and arthralgias.  Skin: Negative for rash.  Allergic/Immunologic: Negative for environmental allergies.  Neurological: Positive for headaches. Negative for dizziness and numbness.       Headaches since starting Amlodipine, limits her taking her BP medication  Psychiatric/Behavioral:       Denies concerns for anxiety or depression       Past Medical History  Diagnosis Date  . Hypertension      Social History   Social History  . Marital Status: Single    Spouse Name: N/A  . Number of Children: N/A  . Years of Education: N/A   Occupational History  . Not on file.   Social History Main Topics  . Smoking status: Never Smoker   . Smokeless tobacco: Never Used  . Alcohol Use: No  .  Drug Use: No  . Sexual Activity:    Partners: Male    Birth Control/ Protection: Condom   Other Topics Concern  . Not on file   Social History Narrative   Single.   No children.    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   Enjoys relaxing.     Past Surgical History  Procedure Laterality Date  . Cholecystectomy    . Bunionectomy  2015    Family History  Problem Relation Age of Onset  . Hypertension Mother   . Diabetes Father   . Stroke Father   . Heart disease Father   . Cancer Maternal Grandmother     breast    No Known Allergies  Current Outpatient Prescriptions on File Prior to Visit  Medication Sig Dispense Refill  . diclofenac (VOLTAREN) 75 MG EC tablet Take 75 mg by mouth as needed.   3   No current facility-administered medications on file prior to visit.    BP 138/86 mmHg  Pulse 70  Temp(Src) 98.2 F (36.8 C) (Oral)  Ht 5' 2.5" (1.588 m)  Wt 228 lb (103.42 kg)  BMI 41.01 kg/m2  SpO2 99%  LMP 01/08/2016    Objective:   Physical Exam  Constitutional: She is oriented to person, place, and time. She appears well-nourished.  HENT:  Right Ear: Tympanic membrane and ear canal normal.  Left Ear: Tympanic membrane and ear canal normal.  Nose: Nose normal.  Mouth/Throat: Oropharynx is clear and moist.  Eyes: Conjunctivae and EOM are normal. Pupils are equal, round,  and reactive to light.  Neck: Neck supple. No thyromegaly present.  Cardiovascular: Normal rate and regular rhythm.   No murmur heard. Pulmonary/Chest: Effort normal and breath sounds normal. She has no rales.  Abdominal: Soft. Bowel sounds are normal. There is no tenderness.  Musculoskeletal: Normal range of motion.  Lymphadenopathy:    She has no cervical adenopathy.  Neurological: She is alert and oriented to person, place, and time. She has normal reflexes. No cranial nerve deficit.  Skin: Skin is warm and dry. No rash noted.  Psychiatric: She has a normal mood and affect.           Assessment & Plan:

## 2016-01-24 NOTE — Assessment & Plan Note (Signed)
Td due, completed today. Pap UTD. Discussed the importance of a healthy diet and regular exercise in order for weight loss and to reduce risk of other medical diseases. Exam unremarkable. Labs unremarkable. Follow up in 1 year for repeat physical.

## 2016-01-24 NOTE — Assessment & Plan Note (Signed)
Poor diet, does not exercise, has given up sodas. Discussed recommendations on improvements in diet and regular exercise.

## 2016-01-24 NOTE — Assessment & Plan Note (Signed)
Persistent headaches since starting Amlodipine which will cause her not to take some days. Without medication BP increases above goal. Will switch to Losartan 50 mg, have her check BP, report readings in 2 weeks. BMP stable.

## 2016-02-07 ENCOUNTER — Telehealth: Payer: Self-pay | Admitting: Primary Care

## 2016-02-07 NOTE — Telephone Encounter (Signed)
-----   Message from Doreene Nest, NP sent at 01/24/2016  8:40 AM EDT ----- Regarding: BP  Please check on BP readings since we switched her from Amlodipine to Losartan.  Also, please schedule her for follow up in 6 months for re-evaluation of BP. Thanks!

## 2016-02-09 NOTE — Telephone Encounter (Signed)
Message left for patient to return my call.  

## 2016-02-12 ENCOUNTER — Encounter: Payer: Self-pay | Admitting: Primary Care

## 2016-02-25 IMAGING — MR MR FOOT*L* W/O CM
4 of 5 series · 19 of 40 positions shown · non-contrast
Comparison: None.

CLINICAL DATA: Left medial ankle pain

EXAM:
MRI OF THE LEFT FOREFOOT WITHOUT CONTRAST
TECHNIQUE: Multiplanar, multisequence MR imaging of the ankle was performed. No
intravenous contrast was administered.

[Series 3: PD · axial · 3.0mm · 0.29mm/px · z∈[-69,+54]mm · 9 of 36 slices shown]
[im 1/36]
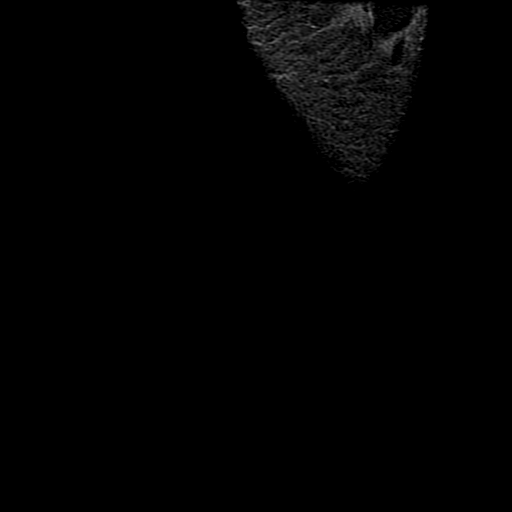
[im 5/36]
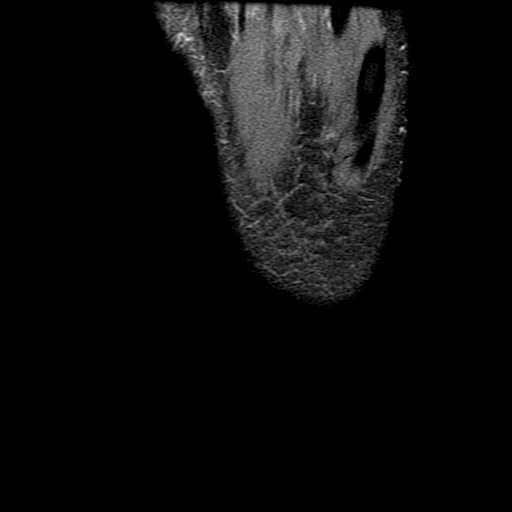
[im 9/36]
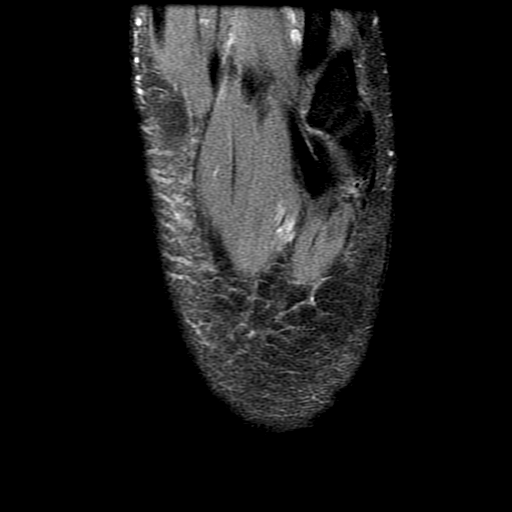
[im 14/36]
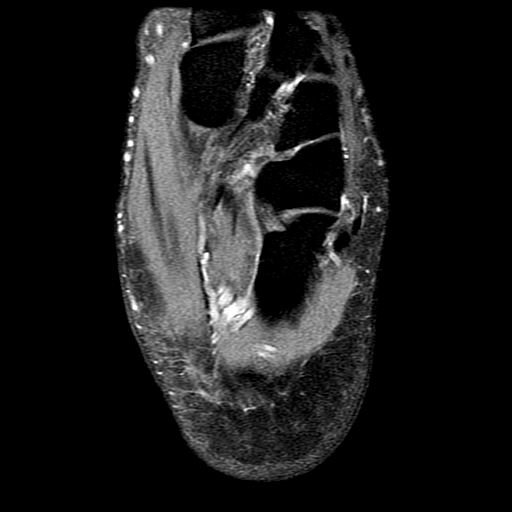
[im 18/36]
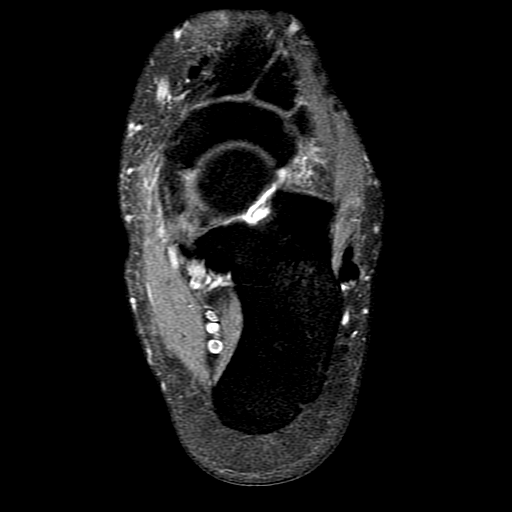
[im 22/36]
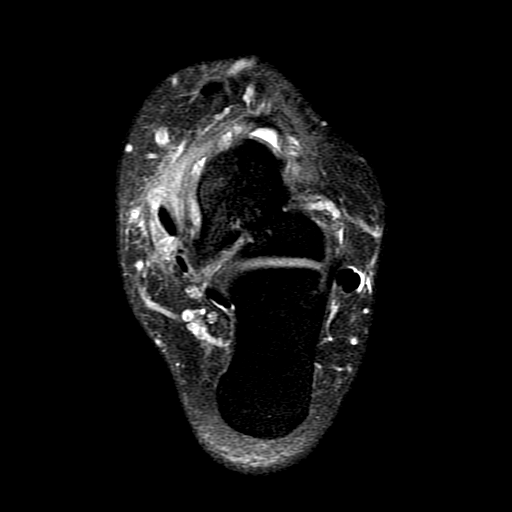
[im 27/36]
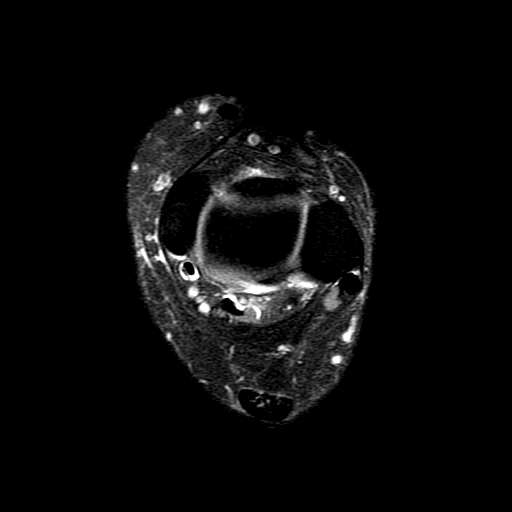
[im 31/36]
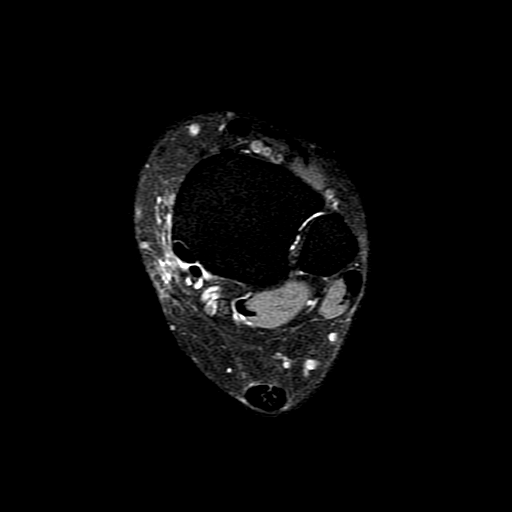
[im 36/36]
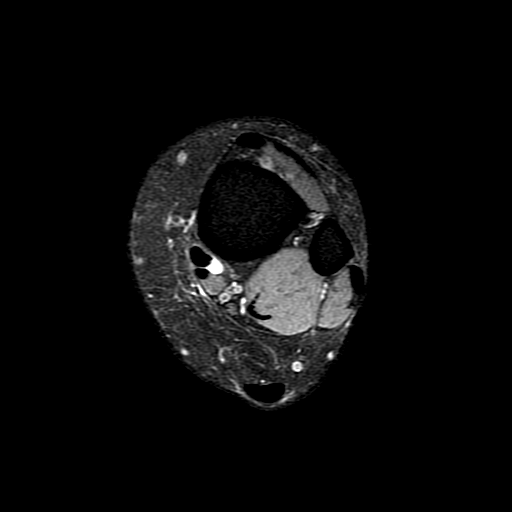

[Series 5: T2 fat-sat · coronal · 3.0mm · 0.31mm/px · 4 of 47 slices shown (1 of 2)]
[im 1/47]
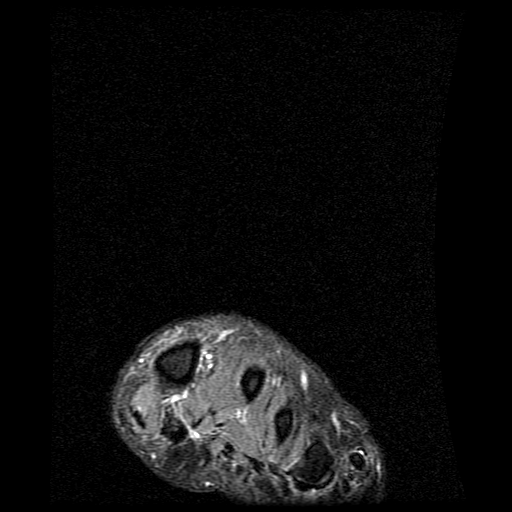
[im 5/47]
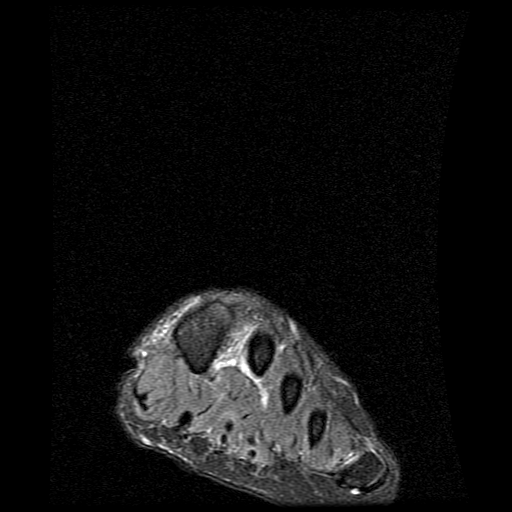
[im 24/47]
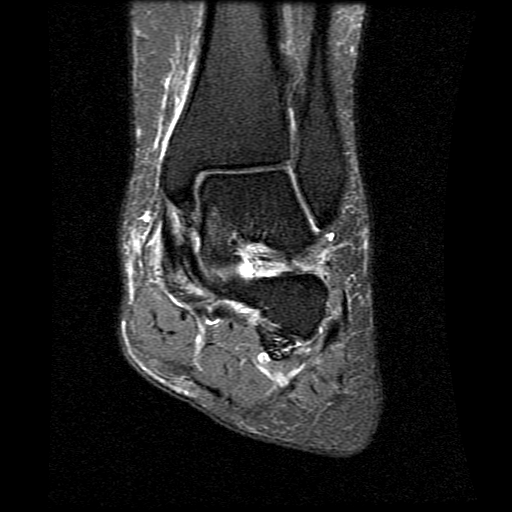
[im 42/47]
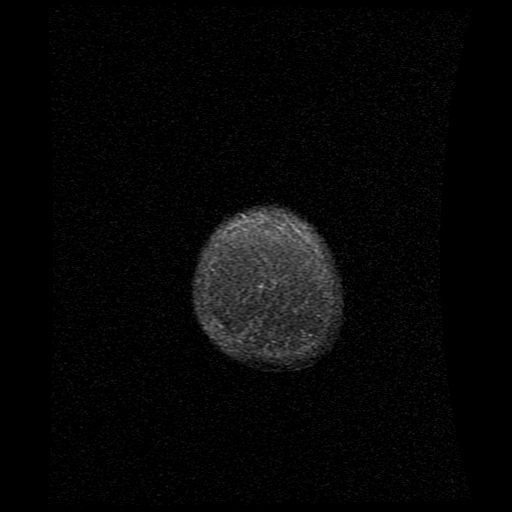

[Series 6: T1 · sagittal · 3.0mm · 0.31mm/px · 3 of 26 slices shown]
[im 6/26]
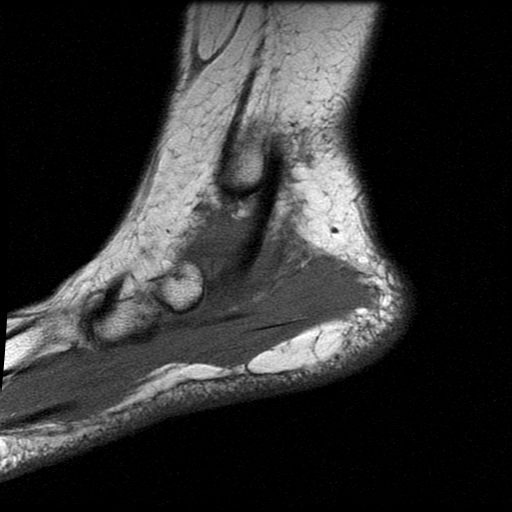
[im 16/26]
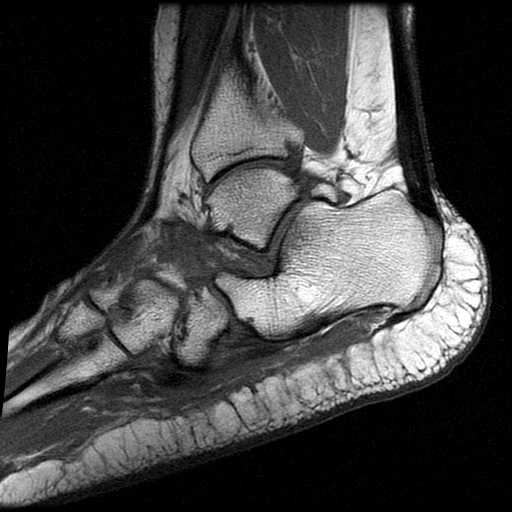
[im 26/26]
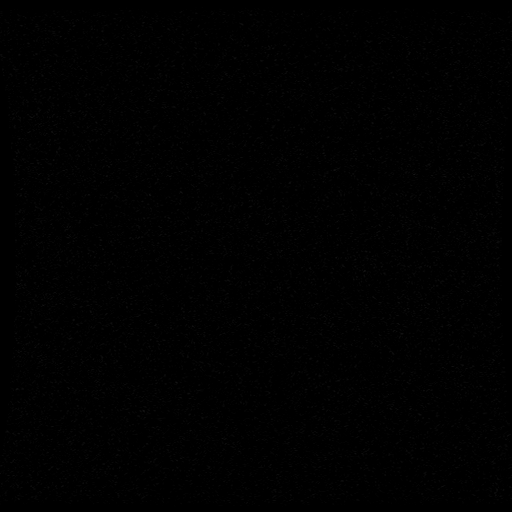

[Series 7: T2 fat-sat · sagittal · 3.0mm · 0.31mm/px · 3 of 26 slices shown (2 of 2)]
[im 6/26]
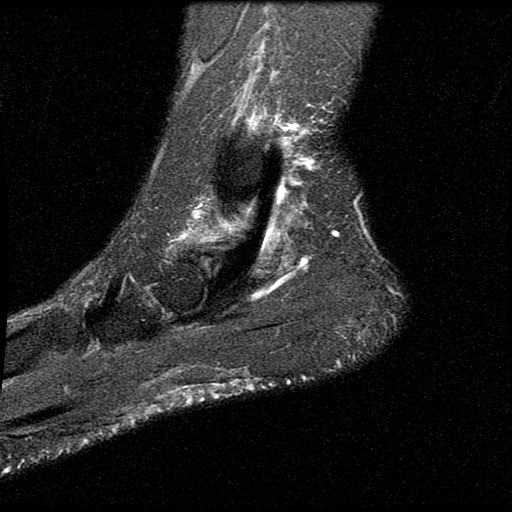
[im 16/26]
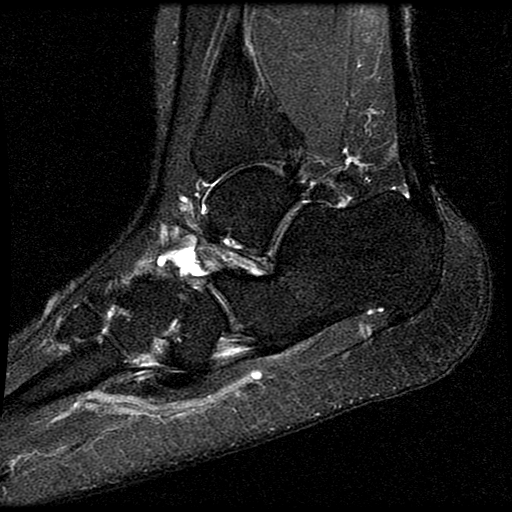
[im 26/26]
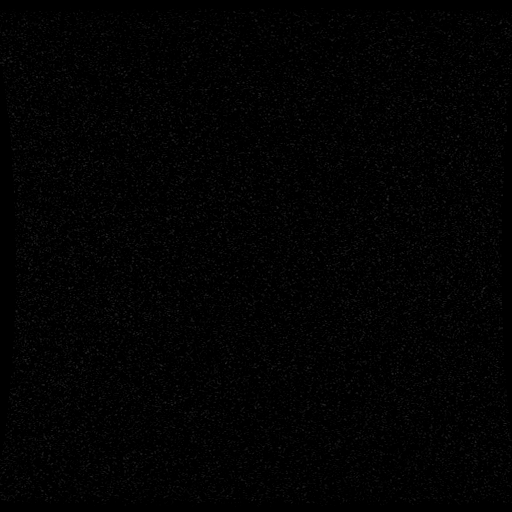

[19 of 40 positions shown; findings below may reference images not displayed]

FINDINGS: TENDONS

Peroneal: Intact.

Posteromedial: Intact. Small amount of fluid in the posterior tibial
tendon and flexor digitorum longus tendon as can be seen with
tenosynovitis.

Anterior: Intact.

Achilles: Intact.

Plantar Fascia: Intact.  Tiny plantar calcaneal spur.

LIGAMENTS

Lateral: Intact.

Medial: There is mild increased signal within the deltoid ligament
which may reflect prior injury. There is thickening of the spring
ligament.

CARTILAGE

Ankle Joint: No chondral defect. No dislocation. No joint effusion.

Subtalar Joints/Sinus Tarsi: Effacement of the normal sinus tarsi
fat as can be seen with sinus tarsi syndrome or synovitis. Normal
subtalar joints.

Bones: Pes planus of the left foot. Mild marrow edema in the medial
aspect of the talar dome.

Other: Capsular thickening of the dorsal talonavicular joint with a
small joint effusion. Mild soft tissue swelling and edema adjacent
to the anteromedial ankle joint capsule. There is a small cystic
area adjacent to the anterior joint anteromedially likely
representing a small ganglion cyst.
IMPRESSION: 1. Effacement of the normal sinus tarsi fat as can be seen with
sinus tarsi syndrome or synovitis.
2. Mild soft tissue edema adjacent to the anteromedial joint capsule
with a small adjacent ganglion cyst.
3. Pes planus.
4. Mild tenosynovitis of the posterior tibial tendon and flexor
digitorum tendon.

## 2016-03-15 MED FILL — LOSARTAN POTASSIUM 50 MG TA: 50 | 30 days supply | Qty: 30 | Fill #1

## 2016-05-02 DIAGNOSIS — M62830 Muscle spasm of back: Secondary | ICD-10-CM | POA: Diagnosis not present

## 2016-05-02 DIAGNOSIS — M955 Acquired deformity of pelvis: Secondary | ICD-10-CM | POA: Diagnosis not present

## 2016-05-02 DIAGNOSIS — M531 Cervicobrachial syndrome: Secondary | ICD-10-CM | POA: Diagnosis not present

## 2016-05-02 DIAGNOSIS — M545 Low back pain: Secondary | ICD-10-CM | POA: Diagnosis not present

## 2016-05-02 DIAGNOSIS — M6080 Other myositis, unspecified site: Secondary | ICD-10-CM | POA: Diagnosis not present

## 2016-05-02 DIAGNOSIS — M9905 Segmental and somatic dysfunction of pelvic region: Secondary | ICD-10-CM | POA: Diagnosis not present

## 2016-05-02 DIAGNOSIS — M9902 Segmental and somatic dysfunction of thoracic region: Secondary | ICD-10-CM | POA: Diagnosis not present

## 2016-05-02 DIAGNOSIS — M9901 Segmental and somatic dysfunction of cervical region: Secondary | ICD-10-CM | POA: Diagnosis not present

## 2016-05-02 DIAGNOSIS — M461 Sacroiliitis, not elsewhere classified: Secondary | ICD-10-CM | POA: Diagnosis not present

## 2016-07-16 ENCOUNTER — Other Ambulatory Visit: Payer: Self-pay | Admitting: Primary Care

## 2016-07-16 DIAGNOSIS — I10 Essential (primary) hypertension: Secondary | ICD-10-CM

## 2016-07-16 MED FILL — LOSARTAN POTASSIUM 50 MG TA: 50 | 30 days supply | Qty: 30 | Fill #0

## 2016-09-05 DIAGNOSIS — M9901 Segmental and somatic dysfunction of cervical region: Secondary | ICD-10-CM | POA: Diagnosis not present

## 2016-09-05 DIAGNOSIS — M9905 Segmental and somatic dysfunction of pelvic region: Secondary | ICD-10-CM | POA: Diagnosis not present

## 2016-09-05 DIAGNOSIS — M545 Low back pain: Secondary | ICD-10-CM | POA: Diagnosis not present

## 2016-09-05 DIAGNOSIS — M6080 Other myositis, unspecified site: Secondary | ICD-10-CM | POA: Diagnosis not present

## 2016-09-05 DIAGNOSIS — M461 Sacroiliitis, not elsewhere classified: Secondary | ICD-10-CM | POA: Diagnosis not present

## 2016-09-05 DIAGNOSIS — M9902 Segmental and somatic dysfunction of thoracic region: Secondary | ICD-10-CM | POA: Diagnosis not present

## 2016-09-05 DIAGNOSIS — M955 Acquired deformity of pelvis: Secondary | ICD-10-CM | POA: Diagnosis not present

## 2016-09-05 DIAGNOSIS — M531 Cervicobrachial syndrome: Secondary | ICD-10-CM | POA: Diagnosis not present

## 2016-09-05 DIAGNOSIS — M62830 Muscle spasm of back: Secondary | ICD-10-CM | POA: Diagnosis not present

## 2016-11-13 DIAGNOSIS — M461 Sacroiliitis, not elsewhere classified: Secondary | ICD-10-CM | POA: Diagnosis not present

## 2016-11-13 DIAGNOSIS — M955 Acquired deformity of pelvis: Secondary | ICD-10-CM | POA: Diagnosis not present

## 2016-11-13 DIAGNOSIS — M545 Low back pain: Secondary | ICD-10-CM | POA: Diagnosis not present

## 2016-11-13 DIAGNOSIS — M62830 Muscle spasm of back: Secondary | ICD-10-CM | POA: Diagnosis not present

## 2016-11-13 DIAGNOSIS — M6080 Other myositis, unspecified site: Secondary | ICD-10-CM | POA: Diagnosis not present

## 2016-11-13 DIAGNOSIS — M9901 Segmental and somatic dysfunction of cervical region: Secondary | ICD-10-CM | POA: Diagnosis not present

## 2016-11-13 DIAGNOSIS — M9905 Segmental and somatic dysfunction of pelvic region: Secondary | ICD-10-CM | POA: Diagnosis not present

## 2016-11-13 DIAGNOSIS — M9902 Segmental and somatic dysfunction of thoracic region: Secondary | ICD-10-CM | POA: Diagnosis not present

## 2016-11-13 DIAGNOSIS — M531 Cervicobrachial syndrome: Secondary | ICD-10-CM | POA: Diagnosis not present

## 2016-12-02 DIAGNOSIS — H5213 Myopia, bilateral: Secondary | ICD-10-CM | POA: Diagnosis not present

## 2016-12-17 ENCOUNTER — Ambulatory Visit (INDEPENDENT_AMBULATORY_CARE_PROVIDER_SITE_OTHER): Payer: 59 | Admitting: Podiatry

## 2016-12-17 ENCOUNTER — Encounter: Payer: Self-pay | Admitting: Podiatry

## 2016-12-17 DIAGNOSIS — Q6652 Congenital pes planus, left foot: Secondary | ICD-10-CM

## 2016-12-17 DIAGNOSIS — M722 Plantar fascial fibromatosis: Secondary | ICD-10-CM | POA: Diagnosis not present

## 2016-12-17 MED ORDER — CELECOXIB 200 MG PO CAPS: 200.0000 mg | ORAL_CAPSULE | Freq: Two times a day (BID) | ORAL | 2 refills | Status: DC

## 2016-12-17 MED FILL — CELECOXIB 200 MG CAP: 200 | 30 days supply | Qty: 60 | Fill #0

## 2016-12-18 NOTE — Progress Notes (Signed)
She presents today after having not seen her for almost a year with a chief complaint of pain to her left foot. She states that she was doing well she states that he just started back hurting recently in the orthotics seem to really be pushing up in her arch as she states it is painful in this area which is near her navicular tuberosity.  Objective: Vital signs are stable she is alert and oriented 3. Pulses are palpable. Neurologic sensorium is intact. Deep tendon reflexes are intact. She has pain on palpation posterior tibial tendon and on the plantar fascia calcaneal insertion site area. More than likely resulting in her inflammation along the medial longitudinal arch and pain with the orthotics.  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  Plan: I reinjected the left heel today with Kenalog and local anesthetic she will continue all other conservative therapies follow up with me in 1 month and we will consider  Orthotics at that time.

## 2017-01-10 MED FILL — LOSARTAN POTASSIUM 50 MG TA: 50 | 30 days supply | Qty: 30 | Fill #1

## 2017-01-27 ENCOUNTER — Other Ambulatory Visit: Payer: Self-pay | Admitting: Primary Care

## 2017-01-27 ENCOUNTER — Other Ambulatory Visit (INDEPENDENT_AMBULATORY_CARE_PROVIDER_SITE_OTHER): Payer: 59

## 2017-01-27 DIAGNOSIS — R7303 Prediabetes: Secondary | ICD-10-CM | POA: Diagnosis not present

## 2017-01-27 DIAGNOSIS — I10 Essential (primary) hypertension: Secondary | ICD-10-CM

## 2017-01-27 LAB — COMPREHENSIVE METABOLIC PANEL
ALK PHOS: 47 U/L (ref 39–117)
ALT: 12 U/L (ref 0–35)
AST: 13 U/L (ref 0–37)
Albumin: 3.8 g/dL (ref 3.5–5.2)
BUN: 18 mg/dL (ref 6–23)
CHLORIDE: 106 meq/L (ref 96–112)
CO2: 27 mEq/L (ref 19–32)
CREATININE: 0.83 mg/dL (ref 0.40–1.20)
Calcium: 9.1 mg/dL (ref 8.4–10.5)
GFR: 101.58 mL/min (ref >60.00)
GLUCOSE: 114 mg/dL — AB (ref 70–99)
POTASSIUM: 4 meq/L (ref 3.5–5.1)
SODIUM: 138 meq/L (ref 135–145)
TOTAL PROTEIN: 6.9 g/dL (ref 6.0–8.3)
Total Bilirubin: 0.4 mg/dL (ref 0.2–1.2)

## 2017-01-27 LAB — LIPID PANEL
CHOL/HDL RATIO: 3
Cholesterol: 145 mg/dL (ref 0–200)
HDL: 46 mg/dL (ref >39.00)
LDL Cholesterol: 90 mg/dL (ref 0–99)
NONHDL: 98.88
Triglycerides: 44 mg/dL (ref 0.0–149.0)
VLDL: 8.8 mg/dL (ref 0.0–40.0)

## 2017-01-27 LAB — HEMOGLOBIN A1C: Hgb A1c MFr Bld: 5.7 % (ref 4.6–6.5)

## 2017-01-28 ENCOUNTER — Ambulatory Visit (INDEPENDENT_AMBULATORY_CARE_PROVIDER_SITE_OTHER): Payer: 59 | Admitting: Podiatry

## 2017-01-28 ENCOUNTER — Encounter: Payer: Self-pay | Admitting: Podiatry

## 2017-01-28 DIAGNOSIS — M76822 Posterior tibial tendinitis, left leg: Secondary | ICD-10-CM | POA: Diagnosis not present

## 2017-01-28 DIAGNOSIS — Q6652 Congenital pes planus, left foot: Secondary | ICD-10-CM | POA: Diagnosis not present

## 2017-01-28 NOTE — Progress Notes (Signed)
She presents today for follow-up of her posterior tibial tendinitis and posterior tibial tendon dysfunction left foot. He states that this point is doing much better she's very happy with the outcome thus far. She continues to take Celebrex regularly.  Objective: Vital signs are stable she's alert and oriented 3 which decrease in edema and erythema to the left foot. She has no pain on palpation of the posterior tibial tendon. Severe pes planus is noted [place] nature she has good range of motion posterior tibial tendon.  Assessment: Posterior tibial tendinitis pes planus posterior tibial tendon dysfunction history of surgery to repair.  Plan: She was scanned for new set of orthotics today.

## 2017-01-30 ENCOUNTER — Ambulatory Visit (INDEPENDENT_AMBULATORY_CARE_PROVIDER_SITE_OTHER): Payer: 59 | Admitting: Primary Care

## 2017-01-30 ENCOUNTER — Encounter: Payer: Self-pay | Admitting: Primary Care

## 2017-01-30 VITALS — BP 118/76 | HR 69 | Temp 97.9°F | Ht 62.5 in | Wt 227.0 lb

## 2017-01-30 DIAGNOSIS — R7303 Prediabetes: Secondary | ICD-10-CM | POA: Diagnosis not present

## 2017-01-30 DIAGNOSIS — I10 Essential (primary) hypertension: Secondary | ICD-10-CM | POA: Diagnosis not present

## 2017-01-30 DIAGNOSIS — Z6841 Body Mass Index (BMI) 40.0 and over, adult: Secondary | ICD-10-CM | POA: Diagnosis not present

## 2017-01-30 DIAGNOSIS — Z0000 Encounter for general adult medical examination without abnormal findings: Secondary | ICD-10-CM

## 2017-01-30 NOTE — Assessment & Plan Note (Signed)
Stable in the clinic today, continue losartan 50 mg. BMP unremarkable.

## 2017-01-30 NOTE — Patient Instructions (Signed)
It's important to improve your diet by reducing consumption of fast food, fried food, processed snack foods, sugary drinks. Increase consumption of fresh vegetables and fruits, whole grains, water.  Ensure you are drinking 64 ounces of water daily.  Start exercising. You should be getting 150 minutes of moderate intensity exercise weekly.  Download the free App "My Fitness Pal" for calorie counting.  Schedule a lab only appointment in 6 months to recheck your blood sugar level.  Follow up in 1 year for your annual exam or sooner if needed.  It was a pleasure to see you today!

## 2017-01-30 NOTE — Progress Notes (Signed)
Subjective:    Patient ID: Linda Mckinney, female    DOB: 03-27-1986, 31 y.o.   MRN: [id number]  HPI  [name] is a 31 year old female who presents today for complete physical.  Immunizations: -Tetanus: Completed in 2017 -Influenza: Did complete last season   Diet: She endorses a fair diet. Breakfast: Atkins shake, boiled egg Lunch: Lean Cuisine, fruit, some fast food Dinner: Fast food Snacks: Crackers, nutella, pretzels, fruit, chips Desserts: Daily Beverages: Water, some sweet tea/juice  Exercise: She does not currently exercise Eye exam: Completed last week. Dental exam: Completes semi-annually Pap Smear: Due this year, following with GYN.   Review of Systems  Constitutional: Negative for unexpected weight change.  HENT: Negative for rhinorrhea.   Respiratory: Negative for cough and shortness of breath.   Cardiovascular: Negative for chest pain.  Gastrointestinal: Negative for constipation and diarrhea.  Genitourinary: Negative for difficulty urinating and menstrual problem.  Musculoskeletal: Negative for arthralgias and myalgias.  Skin: Negative for rash.  Allergic/Immunologic: Negative for environmental allergies.  Neurological: Negative for dizziness, numbness and headaches.  Psychiatric/Behavioral:       Denies concerns for anxiety or depression        Past Medical History:  Diagnosis Date  . Hypertension      Social History   Social History  . Marital status: Single    Spouse name: N/A  . Number of children: N/A  . Years of education: N/A   Occupational History  . Not on file.   Social History Main Topics  . Smoking status: Never Smoker  . Smokeless tobacco: Never Used  . Alcohol use No  . Drug use: No  . Sexual activity: Yes    Partners: Male    Birth control/ protection: Condom   Other Topics Concern  . Not on file   Social History Narrative   Single.   No children.    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     Past Surgical History:  Procedure Laterality Date  . BUNIONECTOMY  2015  . CHOLECYSTECTOMY      Family History  Problem Relation Age of Onset  . Hypertension Mother   . Diabetes Father   . Stroke Father   . Heart disease Father   . Cancer Maternal Grandmother        breast    No Known Allergies  Current Outpatient Prescriptions on File Prior to Visit  Medication Sig Dispense Refill  . Calcium Carbonate Antacid (ANTACID PO) Take 1 capsule by mouth daily.    . celecoxib (CELEBREX) 200 MG capsule Take 1 capsule (200 mg total) by mouth 2 (two) times daily. 60 capsule 2  . losartan (COZAAR) 50 MG tablet TAKE 1 TABLET BY MOUTH ONCE DAILY 30 tablet 5   No current facility-administered medications on file prior to visit.     BP 118/76   Pulse 69   Temp 97.9 F (36.6 C) (Oral)   Ht 5' 2.5" (1.588 m)   Wt 227 lb (103 kg)   LMP 01/20/2017   SpO2 99%   BMI 40.86 kg/m    Objective:   Physical Exam  Constitutional: She is oriented to person, place, and time. She appears well-nourished.  HENT:  Right Ear: Tympanic membrane and ear canal normal.  Left Ear: Tympanic membrane and ear canal normal.  Nose: Nose normal.  Mouth/Throat: Oropharynx is clear and moist.  Eyes: Pupils are equal, round, and reactive to light.  Conjunctivae and EOM are normal.  Neck: Neck supple. No thyromegaly present.  Cardiovascular: Normal rate and regular rhythm.   No murmur heard. Pulmonary/Chest: Effort normal and breath sounds normal. She has no rales.  Abdominal: Soft. Bowel sounds are normal. There is no tenderness.  Musculoskeletal: Normal range of motion.  Lymphadenopathy:    She has no cervical adenopathy.  Neurological: She is alert and oriented to person, place, and time. She has normal reflexes. No cranial nerve deficit.  Skin: Skin is warm and dry. No rash noted.  Psychiatric: She has a normal mood and affect.          Assessment & Plan:

## 2017-01-30 NOTE — Assessment & Plan Note (Signed)
Strongly recommended improvements in diet and regular exercise, information provided today.

## 2017-01-30 NOTE — Assessment & Plan Note (Signed)
Immunizations UTD. Pap due, will see GYN this Fall. Discussed the importance of a healthy diet and regular exercise in order for weight loss, and to reduce the risk of other medical problems. Exam unremarkable. Labs with prediabetes, otherwise unremarkable. Follow up in 1 year for annual exam.

## 2017-01-30 NOTE — Assessment & Plan Note (Signed)
A1C of 5.7 on recent labs. Discussed to start exercising and improve diet. Will recheck in 6 months.

## 2017-02-19 ENCOUNTER — Encounter: Payer: 59 | Admitting: Orthotics

## 2017-04-01 ENCOUNTER — Ambulatory Visit: Payer: Self-pay | Admitting: Physician Assistant

## 2017-04-01 ENCOUNTER — Encounter: Payer: Self-pay | Admitting: Physician Assistant

## 2017-04-01 VITALS — BP 148/100 | HR 77 | Temp 98.7°F

## 2017-04-01 DIAGNOSIS — M25561 Pain in right knee: Secondary | ICD-10-CM

## 2017-04-01 NOTE — Progress Notes (Signed)
S: c/o r knee pain, no known injury, increased pain with bending, squatting, standing for long period of time, some swelling, using ace wrap, is taking celebrex for her foot, no other complaints  O: vitals wnl, nad, skin intact no redness or swelling, full rom, + crepitus at patella, tender at joint line, ligaments appear intact, n/v intact  A: acute knee pain  P: strengthen inner thigh muscles, use ice, copper infused knee sleeve,  replace shoes as the shoes are worn on outside edges, continue celebrex, add tylenol and tumeric, will refer to ortho if not better in 7-10 days,

## 2017-04-03 ENCOUNTER — Encounter: Payer: Self-pay | Admitting: Obstetrics & Gynecology

## 2017-04-03 ENCOUNTER — Ambulatory Visit (INDEPENDENT_AMBULATORY_CARE_PROVIDER_SITE_OTHER): Payer: 59 | Admitting: Obstetrics & Gynecology

## 2017-04-03 VITALS — BP 130/86 | HR 85 | Ht 62.0 in | Wt 231.0 lb

## 2017-04-03 DIAGNOSIS — Z1151 Encounter for screening for human papillomavirus (HPV): Secondary | ICD-10-CM

## 2017-04-03 DIAGNOSIS — Z124 Encounter for screening for malignant neoplasm of cervix: Secondary | ICD-10-CM | POA: Diagnosis not present

## 2017-04-03 DIAGNOSIS — Z01419 Encounter for gynecological examination (general) (routine) without abnormal findings: Secondary | ICD-10-CM

## 2017-04-03 DIAGNOSIS — Z113 Encounter for screening for infections with a predominantly sexual mode of transmission: Secondary | ICD-10-CM

## 2017-04-03 NOTE — Progress Notes (Signed)
GYNECOLOGY ANNUAL PREVENTATIVE CARE ENCOUNTER NOTE  Subjective:   Linda Mckinney is a 31 y.o. G0P0000 female here for a routine annual gynecologic exam.  Current complaints: none.  Patient is sexually active with a female partner who is HIV positive; she uses condoms all the time. Declines being on Pre-exposure prophylaxis (PrEP) medication, is aware that this is widely available. Gets STI testing every year during her annual exam. No plans for pregnancy anytime soon. Denies abnormal vaginal bleeding, discharge, pelvic pain, problems with intercourse or other gynecologic concerns.    Gynecologic History Patient's last menstrual period was 03/19/2017. Contraception: condoms Last Pap: 04/20/14. Results were: normal, negative HPV.  Obstetric History OB History  Gravida Para Term Preterm AB Living  0 0 0 0 0 0  SAB TAB Ectopic Multiple Live Births  0 0 0 0 0        Past Medical History:  Diagnosis Date  . Hypertension     Past Surgical History:  Procedure Laterality Date  . BUNIONECTOMY  2015  . CHOLECYSTECTOMY      Current Outpatient Prescriptions on File Prior to Visit  Medication Sig Dispense Refill  . Calcium Carbonate Antacid (ANTACID PO) Take 1 capsule by mouth daily.    . celecoxib (CELEBREX) 200 MG capsule Take 1 capsule (200 mg total) by mouth 2 (two) times daily. 60 capsule 2  . losartan (COZAAR) 50 MG tablet TAKE 1 TABLET BY MOUTH ONCE DAILY 30 tablet 5   No current facility-administered medications on file prior to visit.     No Known Allergies  Social History   Social History  . Marital status: Single    Spouse name: N/A  . Number of children: N/A  . Years of education: N/A   Occupational History  . Not on file.   Social History Main Topics  . Smoking status: Never Smoker  . Smokeless tobacco: Never Used  . Alcohol use No  . Drug use: No  . Sexual activity: Yes    Partners: Male    Birth control/ protection: Condom   Other Topics Concern    . Not on file   Social History Narrative   Single.   No children.    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     Family History  Problem Relation Age of Onset  . Hypertension Mother   . Diabetes Father   . Stroke Father   . Heart disease Father   . Cancer Maternal Grandmother        breast    The following portions of the patient's history were reviewed and updated as appropriate: allergies, current medications, past family history, past medical history, past social history, past surgical history and problem list.  Review of Systems Pertinent items noted in HPI and remainder of comprehensive ROS otherwise negative.   Objective:  BP 130/86   Pulse 85   Ht  (1.575 m)   Wt 231 lb (104.8 kg)   LMP 03/19/2017   BMI 42.25 kg/m  CONSTITUTIONAL: Well-developed, well-nourished female in no acute distress.  HENT:  Normocephalic, atraumatic, External right and left ear normal. Oropharynx is clear and moist EYES: Conjunctivae and EOM are normal. Pupils are equal, round, and reactive to light. No scleral icterus.  NECK: Normal range of motion, supple, no masses.  Normal thyroid.  SKIN: Skin is warm and dry. No rash noted. Not diaphoretic. No erythema. No pallor. NEUROLOGIC: Alert and oriented to persALOIS MINCERn,  place, and time. Normal reflexes, muscle tone coordination. No cranial nerve deficit noted. PSYCHIATRIC: Normal mood and affect. Normal behavior. Normal judgment and thought content. CARDIOVASCULAR: Normal heart rate noted, regular rhythm RESPIRATORY: Clear to auscultation bilaterally. Effort and breath sounds normal, no problems with respiration noted. BREASTS: Symmetric in size. No masses, skin changes, nipple drainage, or lymphadenopathy. ABDOMEN: Soft, obese, normal bowel sounds, no distention appreciated.  No tenderness, rebound or guarding.  PELVIC: Normal appearing external genitalia; normal appearing vaginal mucosa and cervix.  Normal appearing  discharge.  Pap smear obtained.  Unable to palpate uterus or adnexa secondary to habitus.  MUSCULOSKELETAL: Normal range of motion. No tenderness.  No cyanosis, clubbing, or edema.  2+ distal pulses.   Assessment and Plan:  1. Encounter for gynecological examination with Papanicolaou smear of cervix - Cytology - PAP - Hepatitis C antibody - Hepatitis B surface antigen - RPR - HIV antibody Will follow up results of pap smear, labs and manage accordingly. Routine preventative health maintenance measures emphasized. Will obtain flu vaccination from work. Please refer to After Visit Summary for other counseling recommendations.    Jaynie Collins, MD, FACOG Attending Obstetrician & Gynecologist, [facility]

## 2017-04-03 NOTE — Patient Instructions (Signed)
Preventive Care 18-39 Years, Female Preventive care refers to lifestyle choices and visits with your health care provider that can promote health and wellness. What does preventive care include?  A yearly physical exam. This is also called an annual well check.  Dental exams once or twice a year.  Routine eye exams. Ask your health care provider how often you should have your eyes checked.  Personal lifestyle choices, including: ? Daily care of your teeth and gums. ? Regular physical activity. ? Eating a healthy diet. ? Avoiding tobacco and drug use. ? Limiting alcohol use. ? Practicing safe sex. ? Taking vitamin and mineral supplements as recommended by your health care provider. What happens during an annual well check? The services and screenings done by your health care provider during your annual well check will depend on your age, overall health, lifestyle risk factors, and family history of disease. Counseling Your health care provider may ask you questions about your:  Alcohol use.  Tobacco use.  Drug use.  Emotional well-being.  Home and relationship well-being.  Sexual activity.  Eating habits.  Work and work Statistician.  Method of birth control.  Menstrual cycle.  Pregnancy history.  Screening You may have the following tests or measurements:  Height, weight, and BMI.  Diabetes screening. This is done by checking your blood sugar (glucose) after you have not eaten for a while (fasting).  Blood pressure.  Lipid and cholesterol levels. These may be checked every 5 years starting at age 66.  Skin check.  Hepatitis C blood test.  Hepatitis B blood test.  Sexually transmitted disease (STD) testing.  BRCA-related cancer screening. This may be done if you have a family history of breast, ovarian, tubal, or peritoneal cancers.  Pelvic exam and Pap test. This may be done every 3 years starting at age 40. Starting at age 59, this may be done every 5  years if you have a Pap test in combination with an HPV test.  Discuss your test results, treatment options, and if necessary, the need for more tests with your health care provider. Vaccines Your health care provider may recommend certain vaccines, such as:  Influenza vaccine. This is recommended every year.  Tetanus, diphtheria, and acellular pertussis (Tdap, Td) vaccine. You may need a Td booster every 10 years.  Varicella vaccine. You may need this if you have not been vaccinated.  HPV vaccine. If you are 69 or younger, you may need three doses over 6 months.  Measles, mumps, and rubella (MMR) vaccine. You may need at least one dose of MMR. You may also need a second dose.  Pneumococcal 13-valent conjugate (PCV13) vaccine. You may need this if you have certain conditions and were not previously vaccinated.  Pneumococcal polysaccharide (PPSV23) vaccine. You may need one or two doses if you smoke cigarettes or if you have certain conditions.  Meningococcal vaccine. One dose is recommended if you are age 27-21 years and a first-year college student living in a residence hall, or if you have one of several medical conditions. You may also need additional booster doses.  Hepatitis A vaccine. You may need this if you have certain conditions or if you travel or work in places where you may be exposed to hepatitis A.  Hepatitis B vaccine. You may need this if you have certain conditions or if you travel or work in places where you may be exposed to hepatitis B.  Haemophilus influenzae type b (Hib) vaccine. You may need this if  you have certain risk factors.  Talk to your health care provider about which screenings and vaccines you need and how often you need them. This information is not intended to replace advice given to you by your health care provider. Make sure you discuss any questions you have with your health care provider. Document Released: 08/27/2001 Document Revised: 03/20/2016  Document Reviewed: 05/02/2015 Elsevier Interactive Patient Education  2017 [employer].

## 2017-04-05 LAB — RPR: RPR: NONREACTIVE

## 2017-04-05 LAB — HEPATITIS C ANTIBODY

## 2017-04-05 LAB — HIV ANTIBODY (ROUTINE TESTING W REFLEX): HIV SCREEN 4TH GENERATION: NONREACTIVE

## 2017-04-05 LAB — HEPATITIS B SURFACE ANTIGEN: HEP B S AG: NEGATIVE

## 2017-04-08 LAB — CYTOLOGY - PAP
Chlamydia: NEGATIVE
DIAGNOSIS: NEGATIVE
HPV: NOT DETECTED
Neisseria Gonorrhea: NEGATIVE
Trichomonas: NEGATIVE

## 2017-04-11 DIAGNOSIS — M25561 Pain in right knee: Secondary | ICD-10-CM | POA: Diagnosis not present

## 2017-04-11 MED FILL — LOSARTAN POTASSIUM 50 MG TA: 50 | 30 days supply | Qty: 30 | Fill #2

## 2017-04-11 MED FILL — CELECOXIB 200 MG CAPS: 200 | 30 days supply | Qty: 60 | Fill #1

## 2017-07-02 MED FILL — LOSARTAN POTASSIUM 50 MG TA: 50 | 90 days supply | Qty: 90 | Fill #3

## 2017-07-21 DIAGNOSIS — M546 Pain in thoracic spine: Secondary | ICD-10-CM | POA: Diagnosis not present

## 2017-07-21 DIAGNOSIS — M9902 Segmental and somatic dysfunction of thoracic region: Secondary | ICD-10-CM | POA: Diagnosis not present

## 2017-07-21 DIAGNOSIS — M9905 Segmental and somatic dysfunction of pelvic region: Secondary | ICD-10-CM | POA: Diagnosis not present

## 2017-07-21 DIAGNOSIS — M5441 Lumbago with sciatica, right side: Secondary | ICD-10-CM | POA: Diagnosis not present

## 2017-07-21 DIAGNOSIS — M9903 Segmental and somatic dysfunction of lumbar region: Secondary | ICD-10-CM | POA: Diagnosis not present

## 2017-07-21 DIAGNOSIS — M6080 Other myositis, unspecified site: Secondary | ICD-10-CM | POA: Diagnosis not present

## 2017-07-21 DIAGNOSIS — M955 Acquired deformity of pelvis: Secondary | ICD-10-CM | POA: Diagnosis not present

## 2017-07-21 DIAGNOSIS — M62830 Muscle spasm of back: Secondary | ICD-10-CM | POA: Diagnosis not present

## 2017-07-28 ENCOUNTER — Other Ambulatory Visit: Payer: Self-pay | Admitting: Primary Care

## 2017-07-28 DIAGNOSIS — R7303 Prediabetes: Secondary | ICD-10-CM

## 2017-07-29 ENCOUNTER — Ambulatory Visit (INDEPENDENT_AMBULATORY_CARE_PROVIDER_SITE_OTHER): Payer: 59

## 2017-07-29 ENCOUNTER — Ambulatory Visit (INDEPENDENT_AMBULATORY_CARE_PROVIDER_SITE_OTHER): Payer: 59 | Admitting: Podiatry

## 2017-07-29 ENCOUNTER — Other Ambulatory Visit: Payer: Self-pay | Admitting: Podiatry

## 2017-07-29 DIAGNOSIS — S92254A Nondisplaced fracture of navicular [scaphoid] of right foot, initial encounter for closed fracture: Secondary | ICD-10-CM

## 2017-07-29 DIAGNOSIS — M79671 Pain in right foot: Secondary | ICD-10-CM | POA: Diagnosis not present

## 2017-07-29 NOTE — Progress Notes (Signed)
She presents today with pain to the dorsal medial aspect of the right foot times 1 month.  States the pain is sharp and 10 out of 10.  She states the orthotics seem to be bothering the area right there as she points to the plantar medial aspect of the navicular tuberosity.  She denies any trauma to the foot.  Objective: Vital signs are stable she is alert and oriented x3 she really has no tenderness on palpation of the posterior tibial tendon until we touch the navicular tuberosity.  There is no vibratory pain at that area.  Radiographs taken today do demonstrate a nondisplaced transverse fracture of the navicular tuberosity.  I consulted another physician in our practice Dr. Ardelle AntonWagoner and he confirmed that it did appear to be a fracture which is nondisplaced.  Assessment: Nondisplaced fracture of the navicular tuberosity right foot.  Plan: Place her in a Cam walker today we will follow-up with her in 1 month for another set of x-rays.

## 2017-08-05 MED FILL — CELECOXIB 200 MG CAP: 200 | 30 days supply | Qty: 60 | Fill #2

## 2017-08-06 ENCOUNTER — Other Ambulatory Visit (INDEPENDENT_AMBULATORY_CARE_PROVIDER_SITE_OTHER): Payer: 59

## 2017-08-06 DIAGNOSIS — R7303 Prediabetes: Secondary | ICD-10-CM

## 2017-08-06 LAB — HEMOGLOBIN A1C: Hgb A1c MFr Bld: 5.8 % (ref 4.6–6.5)

## 2017-08-19 ENCOUNTER — Ambulatory Visit (INDEPENDENT_AMBULATORY_CARE_PROVIDER_SITE_OTHER): Payer: 59

## 2017-08-19 ENCOUNTER — Ambulatory Visit (INDEPENDENT_AMBULATORY_CARE_PROVIDER_SITE_OTHER): Payer: 59 | Admitting: Podiatry

## 2017-08-19 DIAGNOSIS — S92254D Nondisplaced fracture of navicular [scaphoid] of right foot, subsequent encounter for fracture with routine healing: Secondary | ICD-10-CM

## 2017-08-19 MED ORDER — TRAMADOL HCL 50 MG PO TABS: 50.0000 mg | ORAL_TABLET | Freq: Three times a day (TID) | ORAL | 0 refills | Status: DC | PRN

## 2017-08-19 MED FILL — traMADol HCL 50 MG TABS: 50 | 10 days supply | Qty: 30 | Fill #0

## 2017-08-19 NOTE — Progress Notes (Signed)
She presents today for follow-up of her fractured navicular tuberosity of her right foot.  He states that it seems to be doing much better she states that she is taking ibuprofen and the Cam walker boot seems to be helping a lot.  Objective: Vital signs are stable she is alert and oriented x3 still retains some edema overlying the medial aspect of the foot over the navicular tuberosity in particular.  Radiographs taken today demonstrate a fracture but no more displaced than it was previously in the navicular tuberosity.  This should go on to heal uneventfully at this point.  Assessment: Well-healing navicular fracture right.  Plan: I encouraged her to continue the use of the

## 2017-09-09 ENCOUNTER — Ambulatory Visit (INDEPENDENT_AMBULATORY_CARE_PROVIDER_SITE_OTHER): Payer: 59 | Admitting: Podiatry

## 2017-09-09 ENCOUNTER — Ambulatory Visit (INDEPENDENT_AMBULATORY_CARE_PROVIDER_SITE_OTHER): Payer: 59

## 2017-09-09 ENCOUNTER — Encounter: Payer: Self-pay | Admitting: Podiatry

## 2017-09-09 DIAGNOSIS — S92254D Nondisplaced fracture of navicular [scaphoid] of right foot, subsequent encounter for fracture with routine healing: Secondary | ICD-10-CM | POA: Diagnosis not present

## 2017-09-09 NOTE — Progress Notes (Signed)
She presents today for follow-up of her navicular fracture.  She states that is doing so much better she is very happy at this point but continues to wear her cam walker.  She states that she wears her tennis shoe around the house but her Cam walker out anytime she is not in her tennis shoes she is in her Cam walker.  Objective: No pain on palpation of the navicular tuberosity.  Radiographs taken today do not demonstrate any type of osseous abnormalities in this area.  Assessment: Pain in limb slowly resolving due to fracture navicular tuberosity.  Plan: Number request that she is to follow-up with me on an as-needed basis.  Continue use the Cam walker when necessary.  This should be lessened over the next few days to weeks.

## 2017-09-16 ENCOUNTER — Telehealth: Payer: 59 | Admitting: Family

## 2017-09-16 DIAGNOSIS — R112 Nausea with vomiting, unspecified: Secondary | ICD-10-CM | POA: Diagnosis not present

## 2017-09-16 MED ORDER — ONDANSETRON HCL 4 MG PO TABS: 4.0000 mg | ORAL_TABLET | Freq: Three times a day (TID) | ORAL | 0 refills | Status: DC | PRN

## 2017-09-16 MED FILL — ONDANSETRON HCL 4 MG TABLET: 4 | 7 days supply | Qty: 20 | Fill #0

## 2017-09-16 NOTE — Progress Notes (Signed)

## 2017-11-09 ENCOUNTER — Encounter: Payer: Self-pay | Admitting: Nurse Practitioner

## 2017-11-09 ENCOUNTER — Ambulatory Visit (INDEPENDENT_AMBULATORY_CARE_PROVIDER_SITE_OTHER): Payer: Self-pay | Admitting: Nurse Practitioner

## 2017-11-09 VITALS — BP 124/82 | HR 75 | Temp 98.6°F | Resp 19 | Wt 224.8 lb

## 2017-11-09 DIAGNOSIS — J301 Allergic rhinitis due to pollen: Secondary | ICD-10-CM

## 2017-11-09 DIAGNOSIS — H6692 Otitis media, unspecified, left ear: Secondary | ICD-10-CM

## 2017-11-09 DIAGNOSIS — J029 Acute pharyngitis, unspecified: Secondary | ICD-10-CM

## 2017-11-09 MED ORDER — AZITHROMYCIN 250 MG PO TABS: ORAL_TABLET | ORAL | 0 refills | Status: DC

## 2017-11-09 MED ORDER — FLUTICASONE PROPIONATE 50 MCG/ACT NA SUSP: 2.0000 | Freq: Every day | NASAL | 6 refills | Status: DC

## 2017-11-09 NOTE — Patient Instructions (Signed)

## 2017-11-09 NOTE — Progress Notes (Signed)
   Subjective:    Patient ID: [name], female    DOB: 1985-08-23, 32 y.o.   MRN: [id number]  HPI Patient comes in today c/o sore throat that started well over a week ago. She has tried OTC meds and warm tea with no relief.    Review of Systems  Constitutional: Positive for chills and fever (last week but not today). Negative for appetite change.  HENT: Positive for congestion, ear pain (left ear), sore throat, trouble swallowing and voice change.   Respiratory: Positive for cough. Negative for shortness of breath.   Cardiovascular: Negative.   Gastrointestinal: Negative.  Negative for nausea and vomiting.  Genitourinary: Negative.   Musculoskeletal: Negative.   Neurological: Negative for headaches.  Psychiatric/Behavioral: Negative.        Objective:   Physical Exam  Constitutional: She appears well-developed and well-nourished. She appears distressed (mild).  HENT:  Right Ear: Hearing, tympanic membrane, external ear and ear canal normal.  Left Ear: Tympanic membrane is erythematous (mild).  Nose: Mucosal edema and rhinorrhea present. Right sinus exhibits no maxillary sinus tenderness and no frontal sinus tenderness. Left sinus exhibits no maxillary sinus tenderness and no frontal sinus tenderness.  Mouth/Throat: Oropharynx is clear and moist and mucous membranes are normal. No oral lesions.  Cardiovascular: Normal rate and regular rhythm.  Pulmonary/Chest: Effort normal.  Musculoskeletal: Normal range of motion.  Neurological: She is alert.  Skin: Skin is warm.   BP 124/82 (BP Location: Right Arm, Patient Position: Sitting, Cuff Size: Normal)   Pulse 75   Temp 98.6 F (37 C) (Oral)   Resp 19   Wt 224 lb 12.8 oz (102 kg)   SpO2 98%   BMI 41.12 kg/m         Assessment & Plan:  1. OM (otitis media), recurrent, left - azithromycin (ZITHROMAX Z-PAK) 250 MG tablet; As directed  Dispense: 6 tablet; Refill: 0  2. Pharyngitis, unspecified etiology Force  fluids rest  3. Allergic rhinitis due to pollen, unspecified seasonality Avoid allergens Proper use of nasal spray discussed - fluticasone (FLONASE) 50 MCG/ACT nasal spray; Place 2 sprays into both nostrils daily.  Dispense: 16 g; Refill: 6  Mary-Margaret Daphine Deutscher, FNP

## 2018-01-06 DIAGNOSIS — M6080 Other myositis, unspecified site: Secondary | ICD-10-CM | POA: Diagnosis not present

## 2018-01-06 DIAGNOSIS — M9902 Segmental and somatic dysfunction of thoracic region: Secondary | ICD-10-CM | POA: Diagnosis not present

## 2018-01-06 DIAGNOSIS — M62830 Muscle spasm of back: Secondary | ICD-10-CM | POA: Diagnosis not present

## 2018-01-06 DIAGNOSIS — M546 Pain in thoracic spine: Secondary | ICD-10-CM | POA: Diagnosis not present

## 2018-01-06 DIAGNOSIS — M9903 Segmental and somatic dysfunction of lumbar region: Secondary | ICD-10-CM | POA: Diagnosis not present

## 2018-01-06 DIAGNOSIS — M5441 Lumbago with sciatica, right side: Secondary | ICD-10-CM | POA: Diagnosis not present

## 2018-01-06 DIAGNOSIS — M955 Acquired deformity of pelvis: Secondary | ICD-10-CM | POA: Diagnosis not present

## 2018-01-06 DIAGNOSIS — M9905 Segmental and somatic dysfunction of pelvic region: Secondary | ICD-10-CM | POA: Diagnosis not present

## 2018-01-28 ENCOUNTER — Encounter: Payer: Self-pay | Admitting: Radiology

## 2018-03-23 ENCOUNTER — Encounter: Payer: Self-pay | Admitting: Primary Care

## 2018-03-23 ENCOUNTER — Ambulatory Visit (INDEPENDENT_AMBULATORY_CARE_PROVIDER_SITE_OTHER): Payer: 59 | Admitting: Primary Care

## 2018-03-23 VITALS — BP 146/96 | HR 65 | Temp 98.2°F | Ht 62.0 in | Wt 229.0 lb

## 2018-03-23 DIAGNOSIS — I10 Essential (primary) hypertension: Secondary | ICD-10-CM

## 2018-03-23 DIAGNOSIS — Z0001 Encounter for general adult medical examination with abnormal findings: Secondary | ICD-10-CM

## 2018-03-23 DIAGNOSIS — K219 Gastro-esophageal reflux disease without esophagitis: Secondary | ICD-10-CM

## 2018-03-23 DIAGNOSIS — R7303 Prediabetes: Secondary | ICD-10-CM | POA: Diagnosis not present

## 2018-03-23 DIAGNOSIS — Z6841 Body Mass Index (BMI) 40.0 and over, adult: Secondary | ICD-10-CM | POA: Diagnosis not present

## 2018-03-23 DIAGNOSIS — Z0000 Encounter for general adult medical examination without abnormal findings: Secondary | ICD-10-CM

## 2018-03-23 LAB — COMPREHENSIVE METABOLIC PANEL
ALK PHOS: 59 U/L (ref 39–117)
ALT: 13 U/L (ref 0–35)
AST: 13 U/L (ref 0–37)
Albumin: 3.9 g/dL (ref 3.5–5.2)
BUN: 17 mg/dL (ref 6–23)
CHLORIDE: 106 meq/L (ref 96–112)
CO2: 28 mEq/L (ref 19–32)
Calcium: 9.1 mg/dL (ref 8.4–10.5)
Creatinine, Ser: 0.78 mg/dL (ref 0.40–1.20)
GFR: 108.39 mL/min (ref >60.00)
Glucose, Bld: 90 mg/dL (ref 70–99)
POTASSIUM: 3.8 meq/L (ref 3.5–5.1)
Sodium: 139 mEq/L (ref 135–145)
TOTAL PROTEIN: 7.1 g/dL (ref 6.0–8.3)
Total Bilirubin: 0.7 mg/dL (ref 0.2–1.2)

## 2018-03-23 LAB — HEMOGLOBIN A1C: HEMOGLOBIN A1C: 5.7 % (ref 4.6–6.5)

## 2018-03-23 LAB — LIPID PANEL
CHOL/HDL RATIO: 3
Cholesterol: 132 mg/dL (ref 0–200)
HDL: 38.9 mg/dL — ABNORMAL LOW (ref >39.00)
LDL CALC: 85 mg/dL (ref 0–99)
NonHDL: 92.81
Triglycerides: 41 mg/dL (ref 0.0–149.0)
VLDL: 8.2 mg/dL (ref 0.0–40.0)

## 2018-03-23 MED ORDER — LOSARTAN POTASSIUM 50 MG PO TABS: 50.0000 mg | ORAL_TABLET | Freq: Every day | ORAL | 3 refills | Status: DC

## 2018-03-23 NOTE — Patient Instructions (Signed)
Start taking your losartan medication for blood pressure once daily.   Start exercising. You should be getting 150 minutes of moderate intensity exercise weekly.  It's important to improve your diet by reducing consumption of fast food, fried food, processed snack foods, sugary drinks. Increase consumption of fresh vegetables and fruits, whole grains, water.  Ensure you are drinking 64 ounces of water daily.  Start monitoring your blood pressure daily, around the same time of day, for the next 2 weeks.  Ensure that you have rested for 30 minutes prior to checking your blood pressure. Record your readings and send them to me on My Chart.   Follow up with your gynecologist as scheduled.  We will see you in one year or sooner based off of labs and your blood pressure readings.  It was a pleasure to see you today!   Preventive Care 18-39 Years, Female Preventive care refers to lifestyle choices and visits with your health care provider that can promote health and wellness. What does preventive care include?  A yearly physical exam. This is also called an annual well check.  Dental exams once or twice a year.  Routine eye exams. Ask your health care provider how often you should have your eyes checked.  Personal lifestyle choices, including: ? Daily care of your teeth and gums. ? Regular physical activity. ? Eating a healthy diet. ? Avoiding tobacco and drug use. ? Limiting alcohol use. ? Practicing safe sex. ? Taking vitamin and mineral supplements as recommended by your health care provider. What happens during an annual well check? The services and screenings done by your health care provider during your annual well check will depend on your age, overall health, lifestyle risk factors, and family history of disease. Counseling Your health care provider may ask you questions about your:  Alcohol use.  Tobacco use.  Drug use.  Emotional well-being.  Home and relationship  well-being.  Sexual activity.  Eating habits.  Work and work Statistician.  Method of birth control.  Menstrual cycle.  Pregnancy history.  Screening You may have the following tests or measurements:  Height, weight, and BMI.  Diabetes screening. This is done by checking your blood sugar (glucose) after you have not eaten for a while (fasting).  Blood pressure.  Lipid and cholesterol levels. These may be checked every 5 years starting at age 74.  Skin check.  Hepatitis C blood test.  Hepatitis B blood test.  Sexually transmitted disease (STD) testing.  BRCA-related cancer screening. This may be done if you have a family history of breast, ovarian, tubal, or peritoneal cancers.  Pelvic exam and Pap test. This may be done every 3 years starting at age 20. Starting at age 57, this may be done every 5 years if you have a Pap test in combination with an HPV test.  Discuss your test results, treatment options, and if necessary, the need for more tests with your health care provider. Vaccines Your health care provider may recommend certain vaccines, such as:  Influenza vaccine. This is recommended every year.  Tetanus, diphtheria, and acellular pertussis (Tdap, Td) vaccine. You may need a Td booster every 10 years.  Varicella vaccine. You may need this if you have not been vaccinated.  HPV vaccine. If you are 32 or younger, you may need three doses over 6 months.  Measles, mumps, and rubella (MMR) vaccine. You may need at least one dose of MMR. You may also need a second dose.  Pneumococcal 13-valent  conjugate (PCV13) vaccine. You may need this if you have certain conditions and were not previously vaccinated.  Pneumococcal polysaccharide (PPSV23) vaccine. You may need one or two doses if you smoke cigarettes or if you have certain conditions.  Meningococcal vaccine. One dose is recommended if you are age 56-21 years and a first-year college student living in a  residence hall, or if you have one of several medical conditions. You may also need additional booster doses.  Hepatitis A vaccine. You may need this if you have certain conditions or if you travel or work in places where you may be exposed to hepatitis A.  Hepatitis B vaccine. You may need this if you have certain conditions or if you travel or work in places where you may be exposed to hepatitis B.  Haemophilus influenzae type b (Hib) vaccine. You may need this if you have certain risk factors.  Talk to your health care provider about which screenings and vaccines you need and how often you need them. This information is not intended to replace advice given to you by your health care provider. Make sure you discuss any questions you have with your health care provider. Document Released: 08/27/2001 Document Revised: 03/20/2016 Document Reviewed: 05/02/2015 Elsevier Interactive Patient Education  [employer].

## 2018-03-23 NOTE — Progress Notes (Signed)
Subjective:    Patient ID: Linda Mckinney, female    DOB: 06/09/1986, 32 y.o.   MRN: [id number]  HPI  Linda Mckinney is a 32 year old female who presents today for complete physical.  BP Readings from Last 3 Encounters:  03/23/18 (!) 146/96  11/09/17 124/82  04/03/17 130/86   She is taking her losartan twice weekly on average. She has had some headaches also getting over a cold.   Immunizations: -Tetanus: Completed in 2017 -Influenza: Due, she will defer until she's feeling better. Getting over a cold now.   Diet: She endorses a fair diet. Breakfast: Tomasa Blase Lunch: Left overs Dinner: [place] chicken, pizza, chicken wings, french fries, green beans Snacks: Crackers Desserts: Daily  Beverages: Water, sweet tea   Exercise: She is not exercising Eye exam: Completed in 2018 Dental exam: Completed semi-annually  Pap Smear: Completed in 2018, follows with GYN.   Review of Systems  Constitutional: Negative for unexpected weight change.  HENT: Negative for rhinorrhea.   Respiratory: Negative for shortness of breath.   Cardiovascular: Negative for chest pain.  Gastrointestinal: Negative for constipation and diarrhea.  Genitourinary: Negative for difficulty urinating and menstrual problem.  Musculoskeletal: Negative for arthralgias and myalgias.  Skin: Negative for rash.  Allergic/Immunologic: Negative for environmental allergies.  Neurological: Positive for headaches. Negative for dizziness and numbness.  Psychiatric/Behavioral: The patient is not nervous/anxious.        Past Medical History:  Diagnosis Date  . Hypertension      Social History   Socioeconomic History  . Marital status: Single    Spouse name: Not on file  . Number of children: Not on file  . Years of education: Not on file  . Highest education level: Not on file  Occupational History  . Not on file  Social Needs  . Financial resource strain: Not on file  . Food insecurity:    Worry: Not on file      Inability: Not on file  . Transportation needs:    Medical: Not on file    Non-medical: Not on file  Tobacco Use  . Smoking status: Never Smoker  . Smokeless tobacco: Never Used  Substance and Sexual Activity  . Alcohol use: No    Alcohol/week: 0.0 standard drinks  . Drug use: No  . Sexual activity: Yes    Partners: Male    Birth control/protection: Condom  Lifestyle  . Physical activity:    Days per week: Not on file    Minutes per session: Not on file  . Stress: Not on file  Relationships  . Social connections:    Talks on phone: Not on file    Gets together: Not on file    Attends religious service: Not on file    Active member of club or organization: Not on file    Attends meetings of clubs or organizations: Not on file    Relationship status: Not on file  . Intimate partner violence:    Fear of current or ex partner: Not on file    Emotionally abused: Not on file    Physically abused: Not on file    Forced sexual activity: Not on file  Other Topics Concern  . Not on file  Social History Narrative   Single.   No children.    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   Enjoys relaxing.     Past Surgical History:  Procedure Laterality Date  . BUNIONECTOMY  2015  .  CHOLECYSTECTOMY      Family History  Problem Relation Age of Onset  . Hypertension Mother   . Diabetes Father   . Stroke Father   . Heart disease Father   . Cancer Maternal Grandmother 60       breast    No Known Allergies  Current Outpatient Medications on File Prior to Visit  Medication Sig Dispense Refill  . Calcium Carbonate Antacid (ANTACID PO) Take 1 capsule by mouth daily.    . fluticasone (FLONASE) 50 MCG/ACT nasal spray Place 2 sprays into both nostrils daily. 16 g 6   No current facility-administered medications on file prior to visit.     BP (!) 146/96   Pulse 65   Temp 98.2 F (36.8 C) (Oral)   Ht 5\' 2"  (1.575 m)   Wt 229 lb (103.9 kg)   LMP 03/23/2018   SpO2 98%    BMI 41.88 kg/m    Objective:   Physical Exam  Constitutional: She is oriented to person, place, and time. She appears well-nourished.  HENT:  Mouth/Throat: No oropharyngeal exudate.  Eyes: Pupils are equal, round, and reactive to light. EOM are normal.  Neck: Neck supple. No thyromegaly present.  Cardiovascular: Normal rate and regular rhythm.  Respiratory: Effort normal and breath sounds normal.  GI: Soft. Bowel sounds are normal. There is no tenderness.  Musculoskeletal: Normal range of motion.  Neurological: She is alert and oriented to person, place, and time.  Skin: Skin is warm and dry.  Psychiatric: She has a normal mood and affect.           Assessment & Plan:

## 2018-03-23 NOTE — Assessment & Plan Note (Signed)
Above goal in office today, only taking losartan twice weekly on average. Discussed to start taking Losartan daily, monitor BP daily. We will message her for BP readings in 2 weeks. BMP pending.

## 2018-03-23 NOTE — Assessment & Plan Note (Signed)
Immunizations UTD, she will get flu shot at work. Pap smear UTD. Discussed to start working on diet and start exercising. Exam unremarkable. Labs pending. Follow up in 1 year for CPE.

## 2018-03-23 NOTE — Assessment & Plan Note (Signed)
Repeat A1C pending.  Discussed the importance of a healthy diet and regular exercise in order for weight loss, and to reduce the risk of any potential medical problems.  

## 2018-03-23 NOTE — Assessment & Plan Note (Signed)
Poor diet and is not exercising. Discussed to limit fried/fatty foods and sugary drinks; increase vegetables, fruit, whole grains, lean protein. Also recommended she start exercising.

## 2018-03-23 NOTE — Assessment & Plan Note (Signed)
Taking omeprazole PRN just before meals that cause symptoms.

## 2018-03-23 NOTE — Addendum Note (Signed)
Addended by: Doreene Nest on: 03/23/2018 02:29 PM   Modules accepted: Orders

## 2018-04-01 NOTE — Progress Notes (Signed)
Last pap 2018- normal  Next: 04/03/2020 Flu shot - will get at job

## 2018-04-06 ENCOUNTER — Encounter: Payer: Self-pay | Admitting: Obstetrics & Gynecology

## 2018-04-06 ENCOUNTER — Telehealth: Payer: Self-pay | Admitting: Obstetrics & Gynecology

## 2018-04-06 ENCOUNTER — Ambulatory Visit (INDEPENDENT_AMBULATORY_CARE_PROVIDER_SITE_OTHER): Payer: 59 | Admitting: Obstetrics & Gynecology

## 2018-04-06 VITALS — BP 136/87 | HR 80 | Resp 16 | Ht 62.0 in | Wt 230.4 lb

## 2018-04-06 DIAGNOSIS — Z01419 Encounter for gynecological examination (general) (routine) without abnormal findings: Secondary | ICD-10-CM

## 2018-04-06 DIAGNOSIS — Z113 Encounter for screening for infections with a predominantly sexual mode of transmission: Secondary | ICD-10-CM

## 2018-04-06 NOTE — Progress Notes (Signed)
GYNECOLOGY ANNUAL PREVENTATIVE CARE ENCOUNTER NOTE  Subjective:   Linda Mckinney is a 32 y.o. G0P0000 female here for a routine annual gynecologic exam.   Patient is sexually active with a female partner who is HIV positive; she uses condoms all the time. Declines being on Pre-exposure prophylaxis (PrEP) medication, is aware that this is widely available.Gets STI testing every year during her annual exam. No plans for pregnancy anytime soon.  Current complaints: none.   Denies abnormal vaginal bleeding, discharge, pelvic pain, problems with intercourse or other gynecologic concerns.    Obstetric and Gynecologic History Patient's last menstrual period was 03/23/2018. Never been pregnant. Contraception: condoms Last Pap: 04/03/2017. Results were: normal with negative HPV   Past Medical History:  Diagnosis Date  . Hypertension     Past Surgical History:  Procedure Laterality Date  . BUNIONECTOMY  2015  . CHOLECYSTECTOMY      Current Outpatient Medications on File Prior to Visit  Medication Sig Dispense Refill  . Calcium Carbonate Antacid (ANTACID PO) Take 1 capsule by mouth daily.    . fluticasone (FLONASE) 50 MCG/ACT nasal spray Place 2 sprays into both nostrils daily. 16 g 6  . losartan (COZAAR) 50 MG tablet Take 1 tablet (50 mg total) by mouth daily. 90 tablet 3   No current facility-administered medications on file prior to visit.    No Known Allergies  Social History:  reports that she has never smoked. She has never used smokeless tobacco. She reports that she does not drink alcohol or use drugs.  Family History  Problem Relation Age of Onset  . Hypertension Mother   . Diabetes Father   . Stroke Father   . Heart disease Father   . Cancer Maternal Grandmother 82       breast    The following portions of the patient's history were reviewed and updated as appropriate: allergies, current medications, past family history, past medical history, past social history, past  surgical history and problem list.  Review of Systems Pertinent items noted in HPI and remainder of comprehensive ROS otherwise negative.   Objective:  LMP 03/23/2018  CONSTITUTIONAL: Well-developed, well-nourished female in no acute distress.  HENT:  Normocephalic, atraumatic, External right and left ear normal. Oropharynx is clear and moist EYES: Conjunctivae and EOM are normal. Pupils are equal, round, and reactive to light. No scleral icterus.  NECK: Normal range of motion, supple, no masses.  Normal thyroid.  SKIN: Skin is warm and dry. No rash noted. Not diaphoretic. No erythema. No pallor. NEUROLOGIC: Alert and oriented to person, place, and time. Normal reflexes, muscle tone coordination. No cranial nerve deficit noted. PSYCHIATRIC: Normal mood and affect. Normal behavior. Normal judgment and thought content. CARDIOVASCULAR: Normal heart rate noted, regular rhythm RESPIRATORY: Clear to auscultation bilaterally. Effort and breath sounds normal, no problems with respiration noted. BREASTS: Symmetric in size. No masses, skin changes, nipple drainage, or lymphadenopathy. ABDOMEN: Soft, obese, normal bowel sounds, no distention appreciated.  No tenderness, rebound or guarding.  PELVIC: Deferred MUSCULOSKELETAL: Normal range of motion. No tenderness.  No cyanosis, clubbing, or edema.  2+ distal pulses.    Assessment and Plan:  1. Well woman exam - Hepatitis B surface antigen - Hepatitis C antibody - RPR - HIV Antibody (routine testing w rflx) - Cervicovaginal ancillary only (obtained via self-swab) Up to date on pap smear screening, patient declines this today. Will follow up results of labs and manage accordingly.  Routine preventative health maintenance measures emphasized.  Will obtain flu vaccination from work. Please refer to After Visit Summary for other counseling recommendations.   Jaynie CollinsUGONNA  ANYANWU, MD, FACOG Obstetrician & Gynecologist, [facility]  [facility], [facility]

## 2018-04-06 NOTE — Patient Instructions (Signed)
Preventive Care 18-39 Years, Female Preventive care refers to lifestyle choices and visits with your health care provider that can promote health and wellness. What does preventive care include?  A yearly physical exam. This is also called an annual well check.  Dental exams once or twice a year.  Routine eye exams. Ask your health care provider how often you should have your eyes checked.  Personal lifestyle choices, including: ? Daily care of your teeth and gums. ? Regular physical activity. ? Eating a healthy diet. ? Avoiding tobacco and drug use. ? Limiting alcohol use. ? Practicing safe sex. ? Taking vitamin and mineral supplements as recommended by your health care provider. What happens during an annual well check? The services and screenings done by your health care provider during your annual well check will depend on your age, overall health, lifestyle risk factors, and family history of disease. Counseling Your health care provider may ask you questions about your:  Alcohol use.  Tobacco use.  Drug use.  Emotional well-being.  Home and relationship well-being.  Sexual activity.  Eating habits.  Work and work Statistician.  Method of birth control.  Menstrual cycle.  Pregnancy history.  Screening You may have the following tests or measurements:  Height, weight, and BMI.  Diabetes screening. This is done by checking your blood sugar (glucose) after you have not eaten for a while (fasting).  Blood pressure.  Lipid and cholesterol levels. These may be checked every 5 years starting at age 66.  Skin check.  Hepatitis C blood test.  Hepatitis B blood test.  Sexually transmitted disease (STD) testing.  BRCA-related cancer screening. This may be done if you have a family history of breast, ovarian, tubal, or peritoneal cancers.  Pelvic exam and Pap test. This may be done every 3 years starting at age 40. Starting at age 59, this may be done every 5  years if you have a Pap test in combination with an HPV test.  Discuss your test results, treatment options, and if necessary, the need for more tests with your health care provider. Vaccines Your health care provider may recommend certain vaccines, such as:  Influenza vaccine. This is recommended every year.  Tetanus, diphtheria, and acellular pertussis (Tdap, Td) vaccine. You may need a Td booster every 10 years.  Varicella vaccine. You may need this if you have not been vaccinated.  HPV vaccine. If you are 69 or younger, you may need three doses over 6 months.  Measles, mumps, and rubella (MMR) vaccine. You may need at least one dose of MMR. You may also need a second dose.  Pneumococcal 13-valent conjugate (PCV13) vaccine. You may need this if you have certain conditions and were not previously vaccinated.  Pneumococcal polysaccharide (PPSV23) vaccine. You may need one or two doses if you smoke cigarettes or if you have certain conditions.  Meningococcal vaccine. One dose is recommended if you are age 27-21 years and a first-year college student living in a residence hall, or if you have one of several medical conditions. You may also need additional booster doses.  Hepatitis A vaccine. You may need this if you have certain conditions or if you travel or work in places where you may be exposed to hepatitis A.  Hepatitis B vaccine. You may need this if you have certain conditions or if you travel or work in places where you may be exposed to hepatitis B.  Haemophilus influenzae type b (Hib) vaccine. You may need this if  you have certain risk factors.  Talk to your health care provider about which screenings and vaccines you need and how often you need them. This information is not intended to replace advice given to you by your health care provider. Make sure you discuss any questions you have with your health care provider. Document Released: 08/27/2001 Document Revised: 03/20/2016  Document Reviewed: 05/02/2015 Elsevier Interactive Patient Education  [employer].

## 2018-04-06 NOTE — Telephone Encounter (Signed)
.    This was opened in error.

## 2018-04-07 LAB — RPR: RPR: NONREACTIVE

## 2018-04-07 LAB — CERVICOVAGINAL ANCILLARY ONLY
Chlamydia: NEGATIVE
Neisseria Gonorrhea: NEGATIVE
TRICH (WINDOWPATH): NEGATIVE

## 2018-04-07 LAB — HEPATITIS B SURFACE ANTIGEN: Hepatitis B Surface Ag: NEGATIVE

## 2018-04-07 LAB — HIV ANTIBODY (ROUTINE TESTING W REFLEX): HIV Screen 4th Generation wRfx: NONREACTIVE

## 2018-04-07 LAB — HEPATITIS C ANTIBODY: Hep C Virus Ab: <0.1 s/co ratio (ref 0.0–0.9)

## 2018-04-08 ENCOUNTER — Encounter: Payer: Self-pay | Admitting: Primary Care

## 2018-04-08 ENCOUNTER — Ambulatory Visit (INDEPENDENT_AMBULATORY_CARE_PROVIDER_SITE_OTHER): Payer: 59 | Admitting: Primary Care

## 2018-04-08 VITALS — BP 124/90 | HR 68 | Temp 98.4°F | Ht 62.0 in | Wt 229.2 lb

## 2018-04-08 DIAGNOSIS — I10 Essential (primary) hypertension: Secondary | ICD-10-CM

## 2018-04-08 MED ORDER — LOSARTAN POTASSIUM 100 MG PO TABS: ORAL_TABLET | ORAL | 0 refills | Status: DC

## 2018-04-08 MED FILL — LOSARTAN POTASSIUM 100 MG T: 100 | 90 days supply | Qty: 90 | Fill #0

## 2018-04-08 NOTE — Progress Notes (Signed)
Subjective:    Patient ID: Linda Mckinney, female    DOB: 02-24-1986, 32 y.o.   MRN: [id number]  HPI  Linda Mckinney is a 32 year old female who presents today for follow up of hypertension.  She is currently managed on losartan 50 mg. During her last visit she admitted that she was missing doses of her medication frequently so she was encouraged to take daily.  BP Readings from Last 3 Encounters:  04/08/18 124/90  04/06/18 136/87  03/23/18 (!) 146/96   Since her last visit she's been checking her blood pressure at home which has been running 120-140's/80-90's. She continues to experience headaches, especially when her blood pressure is on the higher range. She initially took 50 mg of losartan twice daily for two days shortly after her last visit.   She denies chest pain, dizziness, shortness of breath.   Review of Systems  Respiratory: Negative for shortness of breath.   Cardiovascular: Negative for chest pain.  Neurological: Negative for dizziness and headaches.       Past Medical History:  Diagnosis Date  . Hypertension      Social History   Socioeconomic History  . Marital status: Single    Spouse name: Not on file  . Number of children: Not on file  . Years of education: Not on file  . Highest education level: Not on file  Occupational History  . Not on file  Social Needs  . Financial resource strain: Not on file  . Food insecurity:    Worry: Not on file    Inability: Not on file  . Transportation needs:    Medical: Not on file    Non-medical: Not on file  Tobacco Use  . Smoking status: Never Smoker  . Smokeless tobacco: Never Used  Substance and Sexual Activity  . Alcohol use: No    Alcohol/week: 0.0 standard drinks  . Drug use: No  . Sexual activity: Yes    Partners: Male    Birth control/protection: Condom  Lifestyle  . Physical activity:    Days per week: Not on file    Minutes per session: Not on file  . Stress: Not on file  Relationships    . Social connections:    Talks on phone: Not on file    Gets together: Not on file    Attends religious service: Not on file    Active member of club or organization: Not on file    Attends meetings of clubs or organizations: Not on file    Relationship status: Not on file  . Intimate partner violence:    Fear of current or ex partner: Not on file    Emotionally abused: Not on file    Physically abused: Not on file    Forced sexual activity: Not on file  Other Topics Concern  . Not on file  Social History Narrative   Single.   No children.    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   Enjoys relaxing.     Past Surgical History:  Procedure Laterality Date  . BUNIONECTOMY  2015  . CHOLECYSTECTOMY      Family History  Problem Relation Age of Onset  . Hypertension Mother   . Diabetes Father   . Stroke Father   . Heart disease Father   . Cancer Maternal Grandmother 60       breast    No Known Allergies  Current Outpatient Medications on File Prior to  Visit  Medication Sig Dispense Refill  . Calcium Carbonate Antacid (ANTACID PO) Take 1 capsule by mouth daily.    . fluticasone (FLONASE) 50 MCG/ACT nasal spray Place 2 sprays into both nostrils daily. 16 g 6   No current facility-administered medications on file prior to visit.     BP 124/90   Pulse 68   Temp 98.4 F (36.9 C) (Oral)   Ht 5\' 2"  (1.575 m)   Wt 229 lb 4 oz (104 kg)   LMP 03/23/2018   SpO2 98%   BMI 41.93 kg/m    Objective:   Physical Exam  Constitutional: She appears well-nourished.  Cardiovascular: Normal rate and regular rhythm.  Respiratory: Effort normal and breath sounds normal.  Skin: Skin is warm and dry.           Assessment & Plan:

## 2018-04-08 NOTE — Patient Instructions (Addendum)
We've increased the dose of your losartan to 100 mg. You may take two of the 50 mg tablets to equal 100 mg until your bottle is empty.  Continue to monitor your blood pressure and send me readings via my chart in 2 weeks.   Continue to work on improvements in your diet and make sure to exercise regularly.  It was a pleasure to see you today!   DASH Eating Plan DASH stands for "Dietary Approaches to Stop Hypertension." The DASH eating plan is a healthy eating plan that has been shown to reduce high blood pressure (hypertension). It may also reduce your risk for type 2 diabetes, heart disease, and stroke. The DASH eating plan may also help with weight loss. What are tips for following this plan? General guidelines  Avoid eating more than 2,300 mg (milligrams) of salt (sodium) a day. If you have hypertension, you may need to reduce your sodium intake to 1,500 mg a day.  Limit alcohol intake to no more than 1 drink a day for nonpregnant women and 2 drinks a day for men. One drink equals 12 oz of beer, 5 oz of wine, or 1 oz of hard liquor.  Work with your health care provider to maintain a healthy body weight or to lose weight. Ask what an ideal weight is for you.  Get at least 30 minutes of exercise that causes your heart to beat faster (aerobic exercise) most days of the week. Activities may include walking, swimming, or biking.  Work with your health care provider or diet and nutrition specialist (dietitian) to adjust your eating plan to your individual calorie needs. Reading food labels  Check food labels for the amount of sodium per serving. Choose foods with less than 5 percent of the Daily Value of sodium. Generally, foods with less than 300 mg of sodium per serving fit into this eating plan.  To find whole grains, look for the word "whole" as the first word in the ingredient list. Shopping  Buy products labeled as "low-sodium" or "no salt added."  Buy fresh foods. Avoid canned  foods and premade or frozen meals. Cooking  Avoid adding salt when cooking. Use salt-free seasonings or herbs instead of table salt or sea salt. Check with your health care provider or pharmacist before using salt substitutes.  Do not fry foods. Cook foods using healthy methods such as baking, boiling, grilling, and broiling instead.  Cook with heart-healthy oils, such as olive, canola, soybean, or sunflower oil. Meal planning   Eat a balanced diet that includes: ? 5 or more servings of fruits and vegetables each day. At each meal, try to fill half of your plate with fruits and vegetables. ? Up to 6-8 servings of whole grains each day. ? Less than 6 oz of lean meat, poultry, or fish each day. A 3-oz serving of meat is about the same size as a deck of cards. One egg equals 1 oz. ? 2 servings of low-fat dairy each day. ? A serving of nuts, seeds, or beans 5 times each week. ? Heart-healthy fats. Healthy fats called Omega-3 fatty acids are found in foods such as flaxseeds and coldwater fish, like sardines, salmon, and mackerel.  Limit how much you eat of the following: ? Canned or prepackaged foods. ? Food that is high in trans fat, such as fried foods. ? Food that is high in saturated fat, such as fatty meat. ? Sweets, desserts, sugary drinks, and other foods with added sugar. ?  Full-fat dairy products.  Do not salt foods before eating.  Try to eat at least 2 vegetarian meals each week.  Eat more home-cooked food and less restaurant, buffet, and fast food.  When eating at a restaurant, ask that your food be prepared with less salt or no salt, if possible. What foods are recommended? The items listed may not be a complete list. Talk with your dietitian about what dietary choices are best for you. Grains Whole-grain or whole-wheat bread. Whole-grain or whole-wheat pasta. Brown rice. [place]. Bulgur. Whole-grain and low-sodium cereals. Pita bread. Low-fat, low-sodium crackers.  Whole-wheat flour tortillas. Vegetables Fresh or frozen vegetables (raw, steamed, roasted, or grilled). Low-sodium or reduced-sodium tomato and vegetable juice. Low-sodium or reduced-sodium tomato sauce and tomato paste. Low-sodium or reduced-sodium canned vegetables. Fruits All fresh, dried, or frozen fruit. Canned fruit in natural juice (without added sugar). Meat and other protein foods Skinless chicken or [place]. Ground chicken or [place]. Pork with fat trimmed off. Fish and seafood. Egg whites. Dried beans, peas, or lentils. Unsalted nuts, nut butters, and seeds. Unsalted canned beans. Lean cuts of beef with fat trimmed off. Low-sodium, lean deli meat. Dairy Low-fat (1%) or fat-free (skim) milk. Fat-free, low-fat, or reduced-fat cheeses. Nonfat, low-sodium ricotta or cottage cheese. Low-fat or nonfat yogurt. Low-fat, low-sodium cheese. Fats and oils Soft margarine without trans fats. Vegetable oil. Low-fat, reduced-fat, or light mayonnaise and salad dressings (reduced-sodium). Canola, safflower, olive, soybean, and sunflower oils. Avocado. Seasoning and other foods Herbs. Spices. Seasoning mixes without salt. Unsalted popcorn and pretzels. Fat-free sweets. What foods are not recommended? The items listed may not be a complete list. Talk with your dietitian about what dietary choices are best for you. Grains Baked goods made with fat, such as croissants, muffins, or some breads. Dry pasta or rice meal packs. Vegetables Creamed or fried vegetables. Vegetables in a cheese sauce. Regular canned vegetables (not low-sodium or reduced-sodium). Regular canned tomato sauce and paste (not low-sodium or reduced-sodium). Regular tomato and vegetable juice (not low-sodium or reduced-sodium). Angie Fava. Olives. Fruits Canned fruit in a light or heavy syrup. Fried fruit. Fruit in cream or butter sauce. Meat and other protein foods Fatty cuts of meat. Ribs. Fried meat. Berniece Salines. Sausage. Bologna and other  processed lunch meats. Salami. Fatback. Hotdogs. Bratwurst. Salted nuts and seeds. Canned beans with added salt. Canned or smoked fish. Whole eggs or egg yolks. Chicken or [place] with skin. Dairy Whole or 2% milk, cream, and half-and-half. Whole or full-fat cream cheese. Whole-fat or sweetened yogurt. Full-fat cheese. Nondairy creamers. Whipped toppings. Processed cheese and cheese spreads. Fats and oils Butter. Stick margarine. Lard. Shortening. Ghee. Bacon fat. Tropical oils, such as coconut, palm kernel, or palm oil. Seasoning and other foods Salted popcorn and pretzels. Onion salt, garlic salt, seasoned salt, table salt, and sea salt. Worcestershire sauce. Tartar sauce. Barbecue sauce. Teriyaki sauce. Soy sauce, including reduced-sodium. Steak sauce. Canned and packaged gravies. Fish sauce. Oyster sauce. Cocktail sauce. Horseradish that you find on the shelf. Ketchup. Mustard. Meat flavorings and tenderizers. Bouillon cubes. Hot sauce and Tabasco sauce. Premade or packaged marinades. Premade or packaged taco seasonings. Relishes. Regular salad dressings. Where to find more information:  National Heart, Lung, and [place]: https://wilson-eaton.com/  American Heart Association: www.heart.org Summary  The DASH eating plan is a healthy eating plan that has been shown to reduce high blood pressure (hypertension). It may also reduce your risk for type 2 diabetes, heart disease, and stroke.  With the DASH eating plan, you  should limit salt (sodium) intake to 2,300 mg a day. If you have hypertension, you may need to reduce your sodium intake to 1,500 mg a day.  When on the DASH eating plan, aim to eat more fresh fruits and vegetables, whole grains, lean proteins, low-fat dairy, and heart-healthy fats.  Work with your health care provider or diet and nutrition specialist (dietitian) to adjust your eating plan to your individual calorie needs. This information is not intended to replace advice given to  you by your health care provider. Make sure you discuss any questions you have with your health care provider. Document Released: 06/20/2011 Document Revised: 06/24/2016 Document Reviewed: 06/24/2016 Elsevier Interactive Patient Education  [employer].

## 2018-04-08 NOTE — Assessment & Plan Note (Signed)
Above goal in the office today, also with home readings which are similar to manual readings. Given continued headaches with fluctuation of blood pressure, will increase dose to 100 mg. She will send readings via my chart in 2 weeks.   CMP from early September reviewed and is normal.

## 2018-04-21 MED FILL — LOSARTAN POTASSIUM 100 MG T: 100 | 90 days supply | Qty: 90 | Fill #0

## 2018-07-06 DIAGNOSIS — M9905 Segmental and somatic dysfunction of pelvic region: Secondary | ICD-10-CM | POA: Diagnosis not present

## 2018-07-06 DIAGNOSIS — M546 Pain in thoracic spine: Secondary | ICD-10-CM | POA: Diagnosis not present

## 2018-07-06 DIAGNOSIS — M6080 Other myositis, unspecified site: Secondary | ICD-10-CM | POA: Diagnosis not present

## 2018-07-06 DIAGNOSIS — M9902 Segmental and somatic dysfunction of thoracic region: Secondary | ICD-10-CM | POA: Diagnosis not present

## 2018-07-06 DIAGNOSIS — M5441 Lumbago with sciatica, right side: Secondary | ICD-10-CM | POA: Diagnosis not present

## 2018-07-06 DIAGNOSIS — M62830 Muscle spasm of back: Secondary | ICD-10-CM | POA: Diagnosis not present

## 2018-07-06 DIAGNOSIS — M9903 Segmental and somatic dysfunction of lumbar region: Secondary | ICD-10-CM | POA: Diagnosis not present

## 2018-07-06 DIAGNOSIS — M955 Acquired deformity of pelvis: Secondary | ICD-10-CM | POA: Diagnosis not present

## 2018-07-27 ENCOUNTER — Other Ambulatory Visit: Payer: Self-pay | Admitting: Primary Care

## 2018-07-27 DIAGNOSIS — I10 Essential (primary) hypertension: Secondary | ICD-10-CM

## 2018-07-28 MED FILL — LOSARTAN POTASSIUM 100 MG T: 100 | 90 days supply | Qty: 90 | Fill #0

## 2018-08-24 MED FILL — IBUPROFEN 600 MG TABLET: 600 | 4 days supply | Qty: 16 | Fill #0

## 2018-11-23 MED FILL — LOSARTAN POTASSIUM 100 MG T: 100 | 30 days supply | Qty: 30 | Fill #0

## 2019-01-01 MED FILL — LOSARTAN POTASSIUM 100 MG T: 100 | 30 days supply | Qty: 30 | Fill #1

## 2019-01-28 ENCOUNTER — Encounter: Payer: Self-pay | Admitting: Radiology

## 2019-02-15 MED FILL — LOSARTAN POTASSIUM 100 MG T: 100 | 30 days supply | Qty: 30 | Fill #2

## 2019-03-30 ENCOUNTER — Other Ambulatory Visit: Payer: Self-pay | Admitting: Primary Care

## 2019-03-30 DIAGNOSIS — I10 Essential (primary) hypertension: Secondary | ICD-10-CM

## 2019-03-30 DIAGNOSIS — R7303 Prediabetes: Secondary | ICD-10-CM

## 2019-03-30 MED FILL — LOSARTAN POTASSIUM 100 MG T: 100 | 90 days supply | Qty: 90 | Fill #0

## 2019-04-06 ENCOUNTER — Other Ambulatory Visit (INDEPENDENT_AMBULATORY_CARE_PROVIDER_SITE_OTHER): Payer: 59

## 2019-04-06 DIAGNOSIS — R7303 Prediabetes: Secondary | ICD-10-CM

## 2019-04-06 DIAGNOSIS — I10 Essential (primary) hypertension: Secondary | ICD-10-CM

## 2019-04-06 LAB — LIPID PANEL
Cholesterol: 147 mg/dL (ref 0–200)
HDL: 39.3 mg/dL (ref >39.00)
LDL Cholesterol: 99 mg/dL (ref 0–99)
NonHDL: 107.59
Total CHOL/HDL Ratio: 4
Triglycerides: 44 mg/dL (ref 0.0–149.0)
VLDL: 8.8 mg/dL (ref 0.0–40.0)

## 2019-04-06 LAB — COMPREHENSIVE METABOLIC PANEL
ALT: 14 U/L (ref 0–35)
AST: 14 U/L (ref 0–37)
Albumin: 4 g/dL (ref 3.5–5.2)
Alkaline Phosphatase: 61 U/L (ref 39–117)
BUN: 18 mg/dL (ref 6–23)
CO2: 29 mEq/L (ref 19–32)
Calcium: 9.5 mg/dL (ref 8.4–10.5)
Chloride: 103 mEq/L (ref 96–112)
Creatinine, Ser: 0.87 mg/dL (ref 0.40–1.20)
GFR: 89.36 mL/min (ref >60.00)
Glucose, Bld: 101 mg/dL — ABNORMAL HIGH (ref 70–99)
Potassium: 4 mEq/L (ref 3.5–5.1)
Sodium: 136 mEq/L (ref 135–145)
Total Bilirubin: 0.4 mg/dL (ref 0.2–1.2)
Total Protein: 7.3 g/dL (ref 6.0–8.3)

## 2019-04-06 LAB — CBC
HCT: 36.2 % (ref 36.0–46.0)
Hemoglobin: 12.1 g/dL (ref 12.0–15.0)
MCHC: 33.4 g/dL (ref 30.0–36.0)
MCV: 88.3 fl (ref 78.0–100.0)
Platelets: 260 10*3/uL (ref 150.0–400.0)
RBC: 4.11 Mil/uL (ref 3.87–5.11)
RDW: 13.1 % (ref 11.5–15.5)
WBC: 10.4 10*3/uL (ref 4.0–10.5)

## 2019-04-06 LAB — HEMOGLOBIN A1C: Hgb A1c MFr Bld: 5.9 % (ref 4.6–6.5)

## 2019-04-12 ENCOUNTER — Other Ambulatory Visit: Payer: Self-pay

## 2019-04-12 ENCOUNTER — Encounter: Payer: Self-pay | Admitting: Primary Care

## 2019-04-12 ENCOUNTER — Ambulatory Visit (INDEPENDENT_AMBULATORY_CARE_PROVIDER_SITE_OTHER): Payer: 59 | Admitting: Primary Care

## 2019-04-12 VITALS — BP 120/82 | HR 82 | Temp 97.7°F | Ht 62.0 in | Wt 239.5 lb

## 2019-04-12 DIAGNOSIS — R200 Anesthesia of skin: Secondary | ICD-10-CM

## 2019-04-12 DIAGNOSIS — I10 Essential (primary) hypertension: Secondary | ICD-10-CM

## 2019-04-12 DIAGNOSIS — R7303 Prediabetes: Secondary | ICD-10-CM

## 2019-04-12 DIAGNOSIS — K219 Gastro-esophageal reflux disease without esophagitis: Secondary | ICD-10-CM | POA: Diagnosis not present

## 2019-04-12 DIAGNOSIS — R202 Paresthesia of skin: Secondary | ICD-10-CM

## 2019-04-12 DIAGNOSIS — G5603 Carpal tunnel syndrome, bilateral upper limbs: Secondary | ICD-10-CM | POA: Insufficient documentation

## 2019-04-12 DIAGNOSIS — Z0000 Encounter for general adult medical examination without abnormal findings: Secondary | ICD-10-CM

## 2019-04-12 DIAGNOSIS — Z6841 Body Mass Index (BMI) 40.0 and over, adult: Secondary | ICD-10-CM

## 2019-04-12 NOTE — Assessment & Plan Note (Signed)
Increased symptoms recently, using Tums 3-5 days weekly. Discussed to work on reducing triggers, switch to Pepcid daily.

## 2019-04-12 NOTE — Patient Instructions (Signed)
It's important to improve your diet by reducing consumption of fast food, fried food, processed snack foods, sugary drinks. Increase consumption of fresh vegetables and fruits, whole grains, water.  Ensure you are drinking 64 ounces of water daily.  Start exercising. You should be getting 150 minutes of moderate intensity exercise weekly.  Start Ibuprofen 600 mg three times daily for the next week for presumed carpal tunnel symptoms. Consider using a splint to the arms/wrist. Please update me if your symptoms persist.   It was a pleasure to see you today!   Preventive Care 47-59 Years Old, Female Preventive care refers to visits with your health care provider and lifestyle choices that can promote health and wellness. This includes:  A yearly physical exam. This may also be called an annual well check.  Regular dental visits and eye exams.  Immunizations.  Screening for certain conditions.  Healthy lifestyle choices, such as eating a healthy diet, getting regular exercise, not using drugs or products that contain nicotine and tobacco, and limiting alcohol use. What can I expect for my preventive care visit? Physical exam Your health care provider will check your:  Height and weight. This may be used to calculate body mass index (BMI), which tells if you are at a healthy weight.  Heart rate and blood pressure.  Skin for abnormal spots. Counseling Your health care provider may ask you questions about your:  Alcohol, tobacco, and drug use.  Emotional well-being.  Home and relationship well-being.  Sexual activity.  Eating habits.  Work and work Statistician.  Method of birth control.  Menstrual cycle.  Pregnancy history. What immunizations do I need?  Influenza (flu) vaccine  This is recommended every year. Tetanus, diphtheria, and pertussis (Tdap) vaccine  You may need a Td booster every 10 years. Varicella (chickenpox) vaccine  You may need this if you have  not been vaccinated. Human papillomavirus (HPV) vaccine  If recommended by your health care provider, you may need three doses over 6 months. Measles, mumps, and rubella (MMR) vaccine  You may need at least one dose of MMR. You may also need a second dose. Meningococcal conjugate (MenACWY) vaccine  One dose is recommended if you are age 53-21 years and a first-year college student living in a residence hall, or if you have one of several medical conditions. You may also need additional booster doses. Pneumococcal conjugate (PCV13) vaccine  You may need this if you have certain conditions and were not previously vaccinated. Pneumococcal polysaccharide (PPSV23) vaccine  You may need one or two doses if you smoke cigarettes or if you have certain conditions. Hepatitis A vaccine  You may need this if you have certain conditions or if you travel or work in places where you may be exposed to hepatitis A. Hepatitis B vaccine  You may need this if you have certain conditions or if you travel or work in places where you may be exposed to hepatitis B. Haemophilus influenzae type b (Hib) vaccine  You may need this if you have certain conditions. You may receive vaccines as individual doses or as more than one vaccine together in one shot (combination vaccines). Talk with your health care provider about the risks and benefits of combination vaccines. What tests do I need?  Blood tests  Lipid and cholesterol levels. These may be checked every 5 years starting at age 39.  Hepatitis C test.  Hepatitis B test. Screening  Diabetes screening. This is done by checking your blood sugar (glucose)  after you have not eaten for a while (fasting).  Sexually transmitted disease (STD) testing.  BRCA-related cancer screening. This may be done if you have a family history of breast, ovarian, tubal, or peritoneal cancers.  Pelvic exam and Pap test. This may be done every 3 years starting at age 62.  Starting at age 20, this may be done every 5 years if you have a Pap test in combination with an HPV test. Talk with your health care provider about your test results, treatment options, and if necessary, the need for more tests. Follow these instructions at home: Eating and drinking   Eat a diet that includes fresh fruits and vegetables, whole grains, lean protein, and low-fat dairy.  Take vitamin and mineral supplements as recommended by your health care provider.  Do not drink alcohol if: ? Your health care provider tells you not to drink. ? You are pregnant, may be pregnant, or are planning to become pregnant.  If you drink alcohol: ? Limit how much you have to 0-1 drink a day. ? Be aware of how much alcohol is in your drink. In the [place] one drink equals one 12 oz bottle of beer (355 mL), one 5 oz glass of wine (148 mL), or one 1 oz glass of hard liquor (44 mL). Lifestyle  Take daily care of your teeth and gums.  Stay active. Exercise for at least 30 minutes on 5 or more days each week.  Do not use any products that contain nicotine or tobacco, such as cigarettes, e-cigarettes, and chewing tobacco. If you need help quitting, ask your health care provider.  If you are sexually active, practice safe sex. Use a condom or other form of birth control (contraception) in order to prevent pregnancy and STIs (sexually transmitted infections). If you plan to become pregnant, see your health care provider for a preconception visit. What's next?  Visit your health care provider once a year for a well check visit.  Ask your health care provider how often you should have your eyes and teeth checked.  Stay up to date on all vaccines. This information is not intended to replace advice given to you by your health care provider. Make sure you discuss any questions you have with your health care provider. Document Released: 08/27/2001 Document Revised: 03/12/2018 Document Reviewed: 03/12/2018  Elsevier Patient Education  2020 [employer].

## 2019-04-12 NOTE — Assessment & Plan Note (Signed)
Discussed the importance of a healthy diet and regular exercise in order for weight loss, and to reduce the risk of any potential medical problems.  

## 2019-04-12 NOTE — Assessment & Plan Note (Signed)
Slight increase in A1C from 5.7 to 5.9. Discussed to start exercising, limit fast/fried food, limit desserts.   Continue to monitor.

## 2019-04-12 NOTE — Assessment & Plan Note (Signed)
Immunizations UTD. Pap smear UTD, due in 2021. Discussed the importance of a healthy diet and regular exercise in order for weight loss, and to reduce the risk of any potential medical problems.  Exam today unremarkable. See HPI. Labs reviewed. Follow up in 1 year for CPE.

## 2019-04-12 NOTE — Progress Notes (Signed)
Subjective:    Patient ID: Linda Mckinney, female    DOB: 05-Jan-1986, 33 y.o.   MRN: [id number]  HPI  Linda Mckinney is a 33 year old female who presents today for complete physical.  Numbness to right wrist with radiation up to right shoulder, now noticing to left side. Present for the last 3 months. She does work in healthcare, also does hair. She mostly notices her symptoms at bedtime when resting, also after doing hair for several hours at a time or when playing on her phone.   Immunizations: -Tetanus: Completed in 2017 -Influenza: Completed this season   Diet: She endorses a fair diet. Mostly eating protein shakes, fast food, lean protein/vegetable/starch. Drinking mostly water. Desserts 4-5 days weeks Exercise: Not currently exercising   Eye exam: Completed 2 years ago Dental exam: Completed semi-annually  Pap Smear: Completed in 2018  BP Readings from Last 3 Encounters:  04/12/19 120/82  04/08/18 124/90  04/06/18 136/87   Wt Readings from Last 3 Encounters:  04/12/19 239 lb 8 oz (108.6 kg)  04/08/18 229 lb 4 oz (104 kg)  04/06/18 230 lb 6.4 oz (104.5 kg)     Review of Systems  Constitutional: Negative for unexpected weight change.  HENT: Negative for rhinorrhea.   Respiratory: Negative for cough and shortness of breath.   Cardiovascular: Negative for chest pain.  Gastrointestinal: Negative for constipation and diarrhea.  Genitourinary: Negative for difficulty urinating.  Musculoskeletal: Negative for arthralgias and myalgias.  Skin: Negative for rash.  Allergic/Immunologic: Negative for environmental allergies.  Neurological: Positive for numbness. Negative for dizziness and headaches.       Numbness to right wrist with radiation up to right shoulder, now to left side. Present for the last 3 months. She does work in healthcare, also does hair.  Psychiatric/Behavioral: The patient is not nervous/anxious.        Past Medical History:  Diagnosis Date  .  Hypertension      Social History   Socioeconomic History  . Marital status: Single    Spouse name: Not on file  . Number of children: Not on file  . Years of education: Not on file  . Highest education level: Not on file  Occupational History  . Not on file  Social Needs  . Financial resource strain: Not on file  . Food insecurity    Worry: Not on file    Inability: Not on file  . Transportation needs    Medical: Not on file    Non-medical: Not on file  Tobacco Use  . Smoking status: Never Smoker  . Smokeless tobacco: Never Used  Substance and Sexual Activity  . Alcohol use: No    Alcohol/week: 0.0 standard drinks  . Drug use: No  . Sexual activity: Yes    Partners: Male    Birth control/protection: Condom  Lifestyle  . Physical activity    Days per week: Not on file    Minutes per session: Not on file  . Stress: Not on file  Relationships  . Social Musician on phone: Not on file    Gets together: Not on file    Attends religious service: Not on file    Active member of club or organization: Not on file    Attends meetings of clubs or organizations: Not on file    Relationship status: Not on file  . Intimate partner violence    Fear of current or ex partner: Not on file  Emotionally abused: Not on file    Physically abused: Not on file    Forced sexual activity: Not on file  Other Topics Concern  . Not on file  Social History Narrative   Single.   No children.    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   Enjoys relaxing.     Past Surgical History:  Procedure Laterality Date  . BUNIONECTOMY  2015  . CHOLECYSTECTOMY      Family History  Problem Relation Age of Onset  . Hypertension Mother   . Diabetes Father   . Stroke Father   . Heart disease Father   . Cancer Maternal Grandmother 60       breast    No Known Allergies  Current Outpatient Medications on File Prior to Visit  Medication Sig Dispense Refill  . Calcium Carbonate  Antacid (ANTACID PO) Take 1 capsule by mouth daily.    . fluticasone (FLONASE) 50 MCG/ACT nasal spray Place 2 sprays into both nostrils daily. 16 g 6  . losartan (COZAAR) 100 MG tablet TAKE 1 TABLET BY MOUTH DAILY FOR BLOOD PRESSURE. 90 tablet 1   No current facility-administered medications on file prior to visit.     BP 120/82   Pulse 82   Temp 97.7 F (36.5 C) (Temporal)   Ht 5\' 2"  (1.575 m)   Wt 239 lb 8 oz (108.6 kg)   LMP 04/06/2019   SpO2 100%   BMI 43.81 kg/m    Objective:   Physical Exam  Constitutional: She is oriented to person, place, and time. She appears well-nourished.  HENT:  Right Ear: Tympanic membrane and ear canal normal.  Left Ear: Tympanic membrane and ear canal normal.  Mouth/Throat: Oropharynx is clear and moist.  Eyes: Pupils are equal, round, and reactive to light. EOM are normal.  Neck: Neck supple.  Cardiovascular: Normal rate and regular rhythm.  Respiratory: Effort normal and breath sounds normal.  GI: Soft. Bowel sounds are normal. There is no abdominal tenderness.  Musculoskeletal: Normal range of motion.  Neurological: She is alert and oriented to person, place, and time.  Negative Phalen's and Tinel's sign.  Skin: Skin is warm and dry.  Psychiatric: She has a normal mood and affect.           Assessment & Plan:

## 2019-04-12 NOTE — Assessment & Plan Note (Signed)
Suspect carpal tunnel based off of HPI. Discussed use of splints, limit long periods of fine finger/hand movement.  She will start with Ibuprofen 600 mg TID x one week. Consider prednisone and/or ortho referral for continued symptoms.

## 2019-04-12 NOTE — Assessment & Plan Note (Signed)
Stable in the office today, continue Losartan. CMP reviewed.

## 2019-05-31 DIAGNOSIS — R200 Anesthesia of skin: Secondary | ICD-10-CM

## 2019-05-31 DIAGNOSIS — R202 Paresthesia of skin: Secondary | ICD-10-CM

## 2019-05-31 MED ORDER — PREDNISONE 20 MG PO TABS: ORAL_TABLET | ORAL | 0 refills | Status: DC

## 2019-05-31 MED FILL — predniSONE 20 MG TABS: 20 | 8 days supply | Qty: 12 | Fill #0

## 2019-07-13 MED FILL — LOSARTAN POTASSIUM 100 MG T: 100 | 90 days supply | Qty: 90 | Fill #1

## 2019-08-03 DIAGNOSIS — R200 Anesthesia of skin: Secondary | ICD-10-CM

## 2019-08-03 DIAGNOSIS — R202 Paresthesia of skin: Secondary | ICD-10-CM

## 2019-08-10 DIAGNOSIS — M6080 Other myositis, unspecified site: Secondary | ICD-10-CM | POA: Diagnosis not present

## 2019-08-10 DIAGNOSIS — M5441 Lumbago with sciatica, right side: Secondary | ICD-10-CM | POA: Diagnosis not present

## 2019-08-10 DIAGNOSIS — M62830 Muscle spasm of back: Secondary | ICD-10-CM | POA: Diagnosis not present

## 2019-08-10 DIAGNOSIS — M546 Pain in thoracic spine: Secondary | ICD-10-CM | POA: Diagnosis not present

## 2019-08-10 DIAGNOSIS — M955 Acquired deformity of pelvis: Secondary | ICD-10-CM | POA: Diagnosis not present

## 2019-08-10 DIAGNOSIS — M9903 Segmental and somatic dysfunction of lumbar region: Secondary | ICD-10-CM | POA: Diagnosis not present

## 2019-08-10 DIAGNOSIS — M9905 Segmental and somatic dysfunction of pelvic region: Secondary | ICD-10-CM | POA: Diagnosis not present

## 2019-08-10 DIAGNOSIS — M9902 Segmental and somatic dysfunction of thoracic region: Secondary | ICD-10-CM | POA: Diagnosis not present

## 2019-08-17 DIAGNOSIS — M546 Pain in thoracic spine: Secondary | ICD-10-CM | POA: Diagnosis not present

## 2019-08-17 DIAGNOSIS — M6080 Other myositis, unspecified site: Secondary | ICD-10-CM | POA: Diagnosis not present

## 2019-08-17 DIAGNOSIS — M62830 Muscle spasm of back: Secondary | ICD-10-CM | POA: Diagnosis not present

## 2019-08-17 DIAGNOSIS — M9905 Segmental and somatic dysfunction of pelvic region: Secondary | ICD-10-CM | POA: Diagnosis not present

## 2019-08-17 DIAGNOSIS — M9901 Segmental and somatic dysfunction of cervical region: Secondary | ICD-10-CM | POA: Diagnosis not present

## 2019-08-17 DIAGNOSIS — M9902 Segmental and somatic dysfunction of thoracic region: Secondary | ICD-10-CM | POA: Diagnosis not present

## 2019-08-17 DIAGNOSIS — M531 Cervicobrachial syndrome: Secondary | ICD-10-CM | POA: Diagnosis not present

## 2019-08-17 DIAGNOSIS — M545 Low back pain: Secondary | ICD-10-CM | POA: Diagnosis not present

## 2019-08-17 DIAGNOSIS — M9903 Segmental and somatic dysfunction of lumbar region: Secondary | ICD-10-CM | POA: Diagnosis not present

## 2019-08-24 DIAGNOSIS — M546 Pain in thoracic spine: Secondary | ICD-10-CM | POA: Diagnosis not present

## 2019-08-24 DIAGNOSIS — M6080 Other myositis, unspecified site: Secondary | ICD-10-CM | POA: Diagnosis not present

## 2019-08-24 DIAGNOSIS — M545 Low back pain: Secondary | ICD-10-CM | POA: Diagnosis not present

## 2019-08-24 DIAGNOSIS — M9901 Segmental and somatic dysfunction of cervical region: Secondary | ICD-10-CM | POA: Diagnosis not present

## 2019-08-24 DIAGNOSIS — M9902 Segmental and somatic dysfunction of thoracic region: Secondary | ICD-10-CM | POA: Diagnosis not present

## 2019-08-24 DIAGNOSIS — M62830 Muscle spasm of back: Secondary | ICD-10-CM | POA: Diagnosis not present

## 2019-08-24 DIAGNOSIS — M9905 Segmental and somatic dysfunction of pelvic region: Secondary | ICD-10-CM | POA: Diagnosis not present

## 2019-08-24 DIAGNOSIS — M9903 Segmental and somatic dysfunction of lumbar region: Secondary | ICD-10-CM | POA: Diagnosis not present

## 2019-08-24 DIAGNOSIS — M531 Cervicobrachial syndrome: Secondary | ICD-10-CM | POA: Diagnosis not present

## 2019-08-31 DIAGNOSIS — M9902 Segmental and somatic dysfunction of thoracic region: Secondary | ICD-10-CM | POA: Diagnosis not present

## 2019-08-31 DIAGNOSIS — M545 Low back pain: Secondary | ICD-10-CM | POA: Diagnosis not present

## 2019-08-31 DIAGNOSIS — M9903 Segmental and somatic dysfunction of lumbar region: Secondary | ICD-10-CM | POA: Diagnosis not present

## 2019-08-31 DIAGNOSIS — M9905 Segmental and somatic dysfunction of pelvic region: Secondary | ICD-10-CM | POA: Diagnosis not present

## 2019-08-31 DIAGNOSIS — M62830 Muscle spasm of back: Secondary | ICD-10-CM | POA: Diagnosis not present

## 2019-08-31 DIAGNOSIS — M6080 Other myositis, unspecified site: Secondary | ICD-10-CM | POA: Diagnosis not present

## 2019-08-31 DIAGNOSIS — M531 Cervicobrachial syndrome: Secondary | ICD-10-CM | POA: Diagnosis not present

## 2019-08-31 DIAGNOSIS — M546 Pain in thoracic spine: Secondary | ICD-10-CM | POA: Diagnosis not present

## 2019-08-31 DIAGNOSIS — M9901 Segmental and somatic dysfunction of cervical region: Secondary | ICD-10-CM | POA: Diagnosis not present

## 2019-09-07 ENCOUNTER — Other Ambulatory Visit: Payer: Self-pay

## 2019-09-07 ENCOUNTER — Encounter: Payer: Self-pay | Admitting: Diagnostic Neuroimaging

## 2019-09-07 ENCOUNTER — Ambulatory Visit (INDEPENDENT_AMBULATORY_CARE_PROVIDER_SITE_OTHER): Payer: 59 | Admitting: Diagnostic Neuroimaging

## 2019-09-07 VITALS — BP 117/81 | HR 80 | Temp 97.7°F | Ht 62.0 in | Wt 250.0 lb

## 2019-09-07 DIAGNOSIS — R200 Anesthesia of skin: Secondary | ICD-10-CM

## 2019-09-07 DIAGNOSIS — M9902 Segmental and somatic dysfunction of thoracic region: Secondary | ICD-10-CM | POA: Diagnosis not present

## 2019-09-07 DIAGNOSIS — M9901 Segmental and somatic dysfunction of cervical region: Secondary | ICD-10-CM | POA: Diagnosis not present

## 2019-09-07 DIAGNOSIS — M546 Pain in thoracic spine: Secondary | ICD-10-CM | POA: Diagnosis not present

## 2019-09-07 DIAGNOSIS — M9903 Segmental and somatic dysfunction of lumbar region: Secondary | ICD-10-CM | POA: Diagnosis not present

## 2019-09-07 DIAGNOSIS — M545 Low back pain: Secondary | ICD-10-CM | POA: Diagnosis not present

## 2019-09-07 DIAGNOSIS — M62830 Muscle spasm of back: Secondary | ICD-10-CM | POA: Diagnosis not present

## 2019-09-07 DIAGNOSIS — M6080 Other myositis, unspecified site: Secondary | ICD-10-CM | POA: Diagnosis not present

## 2019-09-07 DIAGNOSIS — M9905 Segmental and somatic dysfunction of pelvic region: Secondary | ICD-10-CM | POA: Diagnosis not present

## 2019-09-07 DIAGNOSIS — M531 Cervicobrachial syndrome: Secondary | ICD-10-CM | POA: Diagnosis not present

## 2019-09-07 NOTE — Patient Instructions (Signed)
-   EMG/NCS testing - wrist splints at bedtime

## 2019-09-07 NOTE — Progress Notes (Signed)
[facility]  PATIENT: Linda Mckinney DOB: April 22, 1986  REFERRING CLINICIAN: Doreene Nest, NP HISTORY FROM: patient  REASON FOR VISIT: new consult    HISTORICAL  CHIEF COMPLAINT:  Chief Complaint  Patient presents with  . Numbness    rm 7 New Pt  "numbness, tingling in right hand since Nov 2020"    HISTORY OF PRESENT ILLNESS:   34 year old female here for evaluation of numbness and tingling in hands.  Symptoms started in November 2020.  She describes right hand greater than left, numbness and tingling in her hand.  Sometimes symptoms radiate up her right arm.  Symptoms wake her up at night. Tried prednisone pack without relief.   No problems with legs, feet or low back.  She has some discomfort in her right shoulder.  No problems in face, vision, speech or swallowing.   REVIEW OF SYSTEMS: Full 14 system review of systems performed and negative with exception of: As per HPI.  ALLERGIES: No Known Allergies  HOME MEDICATIONS: Outpatient Medications Prior to Visit  Medication Sig Dispense Refill  . Calcium Carbonate Antacid (ANTACID PO) Take 1 capsule by mouth daily.    . fluticasone (FLONASE) 50 MCG/ACT nasal spray Place 2 sprays into both nostrils daily. 16 g 6  . losartan (COZAAR) 100 MG tablet TAKE 1 TABLET BY MOUTH DAILY FOR BLOOD PRESSURE. 90 tablet 1  . predniSONE (DELTASONE) 20 MG tablet Take 2 tablets for four days, then 1 tablet for four days. 12 tablet 0   No facility-administered medications prior to visit.    PAST MEDICAL HISTORY: Past Medical History:  Diagnosis Date  . Hypertension     PAST SURGICAL HISTORY: Past Surgical History:  Procedure Laterality Date  . BUNIONECTOMY  2015  . CHOLECYSTECTOMY      FAMILY HISTORY: Family History  Problem Relation Age of Onset  . Hypertension Mother   . Diabetes Father   . Stroke Father   . Heart disease Father   . Cancer Maternal Grandmother 60       breast  . Hypertension  Sister     SOCIAL HISTORY: Social History   Socioeconomic History  . Marital status: Single    Spouse name: Not on file  . Number of children: 0  . Years of education: Not on file  . Highest education level: Associate degree: occupational, Scientist, product/process development, or vocational program  Occupational History    Comment: Nurse Tech  Tobacco Use  . Smoking status: Never Smoker  . Smokeless tobacco: Never Used  Substance and Sexual Activity  . Alcohol use: No    Alcohol/week: 0.0 standard drinks  . Drug use: No  . Sexual activity: Yes    Partners: Male    Birth control/protection: Condom  Other Topics Concern  . Not on file  Social History Narrative   Single.   No children.    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    Social Determinants of Health   Financial Resource Strain:   . Difficulty of Paying Living Expenses: Not on file  Food Insecurity:   . Worried About Programme researcher, broadcasting/film/video in the Last Year: Not on file  . Ran Out of Food in the Last Year: Not on file  Transportation Needs:   . Lack of Transportation (Medical): Not on file  . Lack of Transportation (Non-Medical): Not on file  Physical Activity:   . Days of Exercise per Week: Not on file  .  Minutes of Exercise per Session: Not on file  Stress:   . Feeling of Stress : Not on file  Social Connections:   . Frequency of Communication with Friends and Family: Not on file  . Frequency of Social Gatherings with Friends and Family: Not on file  . Attends Religious Services: Not on file  . Active Member of Clubs or Organizations: Not on file  . Attends Archivist Meetings: Not on file  . Marital Status: Not on file  Intimate Partner Violence:   . Fear of Current or Ex-Partner: Not on file  . Emotionally Abused: Not on file  . Physically Abused: Not on file  . Sexually Abused: Not on file     PHYSICAL EXAM  GENERAL EXAM/CONSTITUTIONAL: Vitals:  Vitals:   09/07/19 1308  BP: 117/81  Pulse:  80  Temp: 97.7 F (36.5 C)  Weight: 250 lb (113.4 kg)  Height: 5\' 2"  (1.575 m)     Body mass index is 45.73 kg/m. Wt Readings from Last 3 Encounters:  09/07/19 250 lb (113.4 kg)  04/12/19 239 lb 8 oz (108.6 kg)  04/08/18 229 lb 4 oz (104 kg)     Patient is in no distress; well developed, nourished and groomed; neck is supple  CARDIOVASCULAR:  Examination of carotid arteries is normal; no carotid bruits  Regular rate and rhythm, no murmurs  Examination of peripheral vascular system by observation and palpation is normal  EYES:  Ophthalmoscopic exam of optic discs and posterior segments is normal; no papilledema or hemorrhages  No exam data present  MUSCULOSKELETAL:  Gait, strength, tone, movements noted in Neurologic exam below  NEUROLOGIC: MENTAL STATUS:  No flowsheet data found.  awake, alert, oriented to person, place and time  recent and remote memory intact  normal attention and concentration  language fluent, comprehension intact, naming intact  fund of knowledge appropriate  CRANIAL NERVE:   2nd - no papilledema on fundoscopic exam  2nd, 3rd, 4th, 6th - pupils equal and reactive to light, visual fields full to confrontation, extraocular muscles intact, no nystagmus  5th - facial sensation symmetric  7th - facial strength symmetric  8th - hearing intact  9th - palate elevates symmetrically, uvula midline  11th - shoulder shrug symmetric  12th - tongue protrusion midline  MOTOR:   normal bulk and tone, full strength in the BUE, BLE  SENSORY:   normal and symmetric to light touch, temperature, vibration  POSITIVE PHALENS; NEGATIVE TINELS  COORDINATION:   finger-nose-finger, fine finger movements normal  REFLEXES:   deep tendon reflexes present and symmetric  GAIT/STATION:   narrow based gait     DIAGNOSTIC DATA (LABS, IMAGING, TESTING) - I reviewed patient records, labs, notes, testing and imaging myself where  available.  Lab Results  Component Value Date   WBC 10.4 04/06/2019   HGB 12.1 04/06/2019   HCT 36.2 04/06/2019   MCV 88.3 04/06/2019   PLT 260.0 04/06/2019      Component Value Date/Time   NA 136 04/06/2019 0833   K 4.0 04/06/2019 0833   CL 103 04/06/2019 0833   CO2 29 04/06/2019 0833   GLUCOSE 101 (H) 04/06/2019 0833   BUN 18 04/06/2019 0833   CREATININE 0.87 04/06/2019 0833   CALCIUM 9.5 04/06/2019 0833   PROT 7.3 04/06/2019 0833   ALBUMIN 4.0 04/06/2019 0833   AST 14 04/06/2019 0833   ALT 14 04/06/2019 0833   ALKPHOS 61 04/06/2019 0833   BILITOT 0.4 04/06/2019 0240  Lab Results  Component Value Date   CHOL 147 04/06/2019   HDL 39.30 04/06/2019   LDLCALC 99 04/06/2019   TRIG 44.0 04/06/2019   CHOLHDL 4 04/06/2019   Lab Results  Component Value Date   HGBA1C 5.9 04/06/2019   No results found for: [id number] Lab Results  Component Value Date   TSH 1.78 01/15/2016       ASSESSMENT AND PLAN  34 y.o. year old female here with new onset numbness and tingling in the right hand, greater than left hand, suspicious for carpal tunnel syndrome.  We will proceed with further work-up.  Dx:  1. Bilateral hand numbness     PLAN:  - EMG/NCS testing - wrist splints at bedtime  Orders Placed This Encounter  Procedures  . NCV with EMG(electromyography)   Return for for NCV/EMG.    Suanne Marker, MD 09/07/2019, 1:38 PM Certified in Neurology, Neurophysiology and Neuroimaging  [facility] [address] [place], [place] [address] [phone]

## 2019-09-20 ENCOUNTER — Ambulatory Visit: Payer: 59 | Admitting: Diagnostic Neuroimaging

## 2019-09-28 DIAGNOSIS — M531 Cervicobrachial syndrome: Secondary | ICD-10-CM | POA: Diagnosis not present

## 2019-09-28 DIAGNOSIS — M62830 Muscle spasm of back: Secondary | ICD-10-CM | POA: Diagnosis not present

## 2019-09-28 DIAGNOSIS — M9902 Segmental and somatic dysfunction of thoracic region: Secondary | ICD-10-CM | POA: Diagnosis not present

## 2019-09-28 DIAGNOSIS — M9901 Segmental and somatic dysfunction of cervical region: Secondary | ICD-10-CM | POA: Diagnosis not present

## 2019-09-28 DIAGNOSIS — M545 Low back pain: Secondary | ICD-10-CM | POA: Diagnosis not present

## 2019-09-28 DIAGNOSIS — M546 Pain in thoracic spine: Secondary | ICD-10-CM | POA: Diagnosis not present

## 2019-09-28 DIAGNOSIS — M9905 Segmental and somatic dysfunction of pelvic region: Secondary | ICD-10-CM | POA: Diagnosis not present

## 2019-09-28 DIAGNOSIS — M6080 Other myositis, unspecified site: Secondary | ICD-10-CM | POA: Diagnosis not present

## 2019-09-28 DIAGNOSIS — M9903 Segmental and somatic dysfunction of lumbar region: Secondary | ICD-10-CM | POA: Diagnosis not present

## 2019-10-14 ENCOUNTER — Encounter (INDEPENDENT_AMBULATORY_CARE_PROVIDER_SITE_OTHER): Payer: 59 | Admitting: Diagnostic Neuroimaging

## 2019-10-14 ENCOUNTER — Other Ambulatory Visit: Payer: Self-pay

## 2019-10-14 ENCOUNTER — Ambulatory Visit: Payer: 59 | Admitting: Diagnostic Neuroimaging

## 2019-10-14 DIAGNOSIS — R200 Anesthesia of skin: Secondary | ICD-10-CM

## 2019-10-14 DIAGNOSIS — Z0289 Encounter for other administrative examinations: Secondary | ICD-10-CM

## 2019-10-14 DIAGNOSIS — G5603 Carpal tunnel syndrome, bilateral upper limbs: Secondary | ICD-10-CM

## 2019-10-19 ENCOUNTER — Other Ambulatory Visit: Payer: Self-pay | Admitting: Primary Care

## 2019-10-19 DIAGNOSIS — I10 Essential (primary) hypertension: Secondary | ICD-10-CM

## 2019-10-19 MED FILL — LOSARTAN POTASSIUM 100 MG T: 100 | 90 days supply | Qty: 90 | Fill #0

## 2019-10-21 NOTE — Procedures (Signed)
[facility]  NCS (NERVE CONDUCTION STUDY) WITH EMG (ELECTROMYOGRAPHY) REPORT   STUDY DATE: 10/14/19 PATIENT NAME: Linda Mckinney DOB: 1986/05/17 MRN: [id number]  ORDERING CLINICIAN: Joycelyn Schmid, MD   TECHNOLOGIST: Durenda Age ELECTROMYOGRAPHER: Glenford Bayley. Amyria Komar, MD  CLINICAL INFORMATION: 34 year old female with bilateral numbness in hands.  FINDINGS: NERVE CONDUCTION STUDY:  Bilateral median motor responses prolonged distal latencies (left 6.3 ms, right 8.2 ms), normal amplitudes and normal conduction velocities.  Bilateral ulnar motor responses are normal.  Right median sensory response cannot be obtained.  Left median sensory response has prolonged peak latency decreased amplitude.  Bilateral ulnar sensory responses are normal.  Bilateral ulnar F-wave latencies are normal.   NEEDLE ELECTROMYOGRAPHY:  Needle examination of right upper extremity is normal.   IMPRESSION:   Abnormal study demonstrating: -Bilateral median neuropathies at the wrist consistent with bilateral carpal tunnel syndrome.  This is moderate to severe on the right and mild on the left.    INTERPRETING PHYSICIAN:  Suanne Marker, MD Certified in Neurology, Neurophysiology and Neuroimaging  [facility] [address] [place], [place] [address] [phone]  [facility]    Nerve / Sites Muscle Latency Ref. Amplitude Ref. Rel Amp Segments Distance Velocity Ref. Area    ms ms mV mV %  cm m/s m/s mVms  L Median - APB     Wrist APB 6.3 ?4.4 4.8 ?4.0 100 Wrist - APB 7   24.7     Upper arm APB 10.2  4.4  91.3 Upper arm - Wrist 21 54 ?49 20.8  R Median - APB     Wrist APB 8.2 ?4.4 6.1 ?4.0 100 Wrist - APB 7   21.1     Upper arm APB 12.5  5.3  87.2 Upper arm - Wrist 22 51 ?49 19.5  L Ulnar - ADM     Wrist ADM 2.6 ?3.3 13.0 ?6.0 100 Wrist - ADM 7   43.8     B.Elbow ADM 6.0  12.6  96.7 B.Elbow - Wrist 20 59 ?49 43.9     A.Elbow ADM 7.7  11.6  92.4  A.Elbow - B.Elbow 10 60 ?49 42.0         A.Elbow - Wrist      R Ulnar - ADM     Wrist ADM 2.6 ?3.3 11.6 ?6.0 100 Wrist - ADM 7   40.1     B.Elbow ADM 6.0  11.2  97 B.Elbow - Wrist 19 56 ?49 40.1     A.Elbow ADM 7.8  11.0  98.1 A.Elbow - B.Elbow 10 57 ?49 39.7         A.Elbow - Wrist                 SNC    Nerve / Sites Rec. Site Peak Lat Ref.  Amp Ref. Segments Distance    ms ms V V  cm  L Median - Orthodromic (Dig II, Mid palm)     Dig II Wrist 5.0 ?3.4 4 ?10 Dig II - Wrist 13  R Median - Orthodromic (Dig II, Mid palm)     Dig II Wrist NR ?3.4 NR ?10 Dig II - Wrist 13  L Ulnar - Orthodromic, (Dig V, Mid palm)     Dig V Wrist 2.9 ?3.1 15 ?5 Dig V - Wrist 11  R Ulnar - Orthodromic, (Dig V, Mid palm)     Dig V Wrist 2.9 ?3.1 10 ?5 Dig V - Wrist  64             F  Wave    Nerve F Lat Ref.   ms ms  L Ulnar - ADM 29.8 ?32.0  R Ulnar - ADM 28.4 ?32.0         EMG Summary Table    Spontaneous MUAP Recruitment  Muscle IA Fib PSW Fasc Other Amp Dur. Poly Pattern  R. Deltoid Normal None None None _______ Normal Normal Normal Normal  R. Biceps brachii Normal None None None _______ Normal Normal Normal Normal  R. Triceps brachii Normal None None None _______ Normal Normal Normal Normal  R. Flexor carpi radialis Normal None None None _______ Normal Normal Normal Normal  R. First dorsal interosseous Normal None None None _______ Normal Normal Normal Normal

## 2019-11-01 DIAGNOSIS — G5603 Carpal tunnel syndrome, bilateral upper limbs: Secondary | ICD-10-CM | POA: Diagnosis not present

## 2019-11-02 DIAGNOSIS — M62830 Muscle spasm of back: Secondary | ICD-10-CM | POA: Diagnosis not present

## 2019-11-02 DIAGNOSIS — M9903 Segmental and somatic dysfunction of lumbar region: Secondary | ICD-10-CM | POA: Diagnosis not present

## 2019-11-02 DIAGNOSIS — M9905 Segmental and somatic dysfunction of pelvic region: Secondary | ICD-10-CM | POA: Diagnosis not present

## 2019-11-02 DIAGNOSIS — M546 Pain in thoracic spine: Secondary | ICD-10-CM | POA: Diagnosis not present

## 2019-11-02 DIAGNOSIS — M545 Low back pain: Secondary | ICD-10-CM | POA: Diagnosis not present

## 2019-11-02 DIAGNOSIS — M9902 Segmental and somatic dysfunction of thoracic region: Secondary | ICD-10-CM | POA: Diagnosis not present

## 2019-11-02 DIAGNOSIS — M9901 Segmental and somatic dysfunction of cervical region: Secondary | ICD-10-CM | POA: Diagnosis not present

## 2019-11-02 DIAGNOSIS — M531 Cervicobrachial syndrome: Secondary | ICD-10-CM | POA: Diagnosis not present

## 2019-11-02 DIAGNOSIS — M6080 Other myositis, unspecified site: Secondary | ICD-10-CM | POA: Diagnosis not present

## 2019-12-06 DIAGNOSIS — G5603 Carpal tunnel syndrome, bilateral upper limbs: Secondary | ICD-10-CM | POA: Diagnosis not present

## 2020-02-22 MED FILL — LOSARTAN POTASSIUM 100 MG T: 100 | 90 days supply | Qty: 90 | Fill #1

## 2020-04-10 DIAGNOSIS — G5603 Carpal tunnel syndrome, bilateral upper limbs: Secondary | ICD-10-CM | POA: Diagnosis not present

## 2020-04-24 ENCOUNTER — Other Ambulatory Visit: Payer: Self-pay | Admitting: Primary Care

## 2020-04-24 DIAGNOSIS — R7303 Prediabetes: Secondary | ICD-10-CM

## 2020-04-24 DIAGNOSIS — I10 Essential (primary) hypertension: Secondary | ICD-10-CM

## 2020-04-28 ENCOUNTER — Other Ambulatory Visit: Payer: 59

## 2020-05-01 ENCOUNTER — Other Ambulatory Visit (INDEPENDENT_AMBULATORY_CARE_PROVIDER_SITE_OTHER): Payer: 59

## 2020-05-01 ENCOUNTER — Other Ambulatory Visit: Payer: Self-pay

## 2020-05-01 DIAGNOSIS — R7303 Prediabetes: Secondary | ICD-10-CM | POA: Diagnosis not present

## 2020-05-01 DIAGNOSIS — I10 Essential (primary) hypertension: Secondary | ICD-10-CM

## 2020-05-01 LAB — COMPREHENSIVE METABOLIC PANEL
ALT: 15 U/L (ref 0–35)
AST: 13 U/L (ref 0–37)
Albumin: 4.1 g/dL (ref 3.5–5.2)
Alkaline Phosphatase: 64 U/L (ref 39–117)
BUN: 16 mg/dL (ref 6–23)
CO2: 28 mEq/L (ref 19–32)
Calcium: 9.3 mg/dL (ref 8.4–10.5)
Chloride: 103 mEq/L (ref 96–112)
Creatinine, Ser: 1.01 mg/dL (ref 0.40–1.20)
GFR: 71.24 mL/min (ref >60.00)
Glucose, Bld: 108 mg/dL — ABNORMAL HIGH (ref 70–99)
Potassium: 4.7 mEq/L (ref 3.5–5.1)
Sodium: 137 mEq/L (ref 135–145)
Total Bilirubin: 0.8 mg/dL (ref 0.2–1.2)
Total Protein: 7.2 g/dL (ref 6.0–8.3)

## 2020-05-01 LAB — LIPID PANEL
Cholesterol: 158 mg/dL (ref 0–200)
HDL: 44.7 mg/dL (ref >39.00)
LDL Cholesterol: 102 mg/dL — ABNORMAL HIGH (ref 0–99)
NonHDL: 112.84
Total CHOL/HDL Ratio: 4
Triglycerides: 54 mg/dL (ref 0.0–149.0)
VLDL: 10.8 mg/dL (ref 0.0–40.0)

## 2020-05-01 LAB — HEMOGLOBIN A1C: Hgb A1c MFr Bld: 6.1 % (ref 4.6–6.5)

## 2020-05-05 ENCOUNTER — Other Ambulatory Visit: Payer: Self-pay

## 2020-05-05 ENCOUNTER — Ambulatory Visit (INDEPENDENT_AMBULATORY_CARE_PROVIDER_SITE_OTHER): Payer: 59 | Admitting: Primary Care

## 2020-05-05 ENCOUNTER — Encounter: Payer: Self-pay | Admitting: Primary Care

## 2020-05-05 VITALS — BP 118/80 | HR 68 | Temp 98.6°F | Ht 62.0 in | Wt 246.0 lb

## 2020-05-05 DIAGNOSIS — R7303 Prediabetes: Secondary | ICD-10-CM | POA: Diagnosis not present

## 2020-05-05 DIAGNOSIS — F419 Anxiety disorder, unspecified: Secondary | ICD-10-CM | POA: Diagnosis not present

## 2020-05-05 DIAGNOSIS — Z0000 Encounter for general adult medical examination without abnormal findings: Secondary | ICD-10-CM | POA: Diagnosis not present

## 2020-05-05 DIAGNOSIS — I10 Essential (primary) hypertension: Secondary | ICD-10-CM | POA: Diagnosis not present

## 2020-05-05 DIAGNOSIS — Z6841 Body Mass Index (BMI) 40.0 and over, adult: Secondary | ICD-10-CM

## 2020-05-05 DIAGNOSIS — F4323 Adjustment disorder with mixed anxiety and depressed mood: Secondary | ICD-10-CM | POA: Insufficient documentation

## 2020-05-05 NOTE — Patient Instructions (Signed)
Start exercising. You should be getting 150 minutes of moderate intensity exercise weekly.  It's important to improve your diet by reducing consumption of fast food, fried food, processed snack foods, sugary drinks. Increase consumption of fresh vegetables and fruits, whole grains, water.  Ensure you are drinking 64 ounces of water daily.  Schedule a lab appointment for 6 months to repeat the blood sugar test A1C.  It was a pleasure to see you today!   Prediabetes Eating Plan Prediabetes is a condition that causes blood sugar (glucose) levels to be higher than normal. This increases the risk for developing diabetes. In order to prevent diabetes from developing, your health care provider may recommend a diet and other lifestyle changes to help you:  Control your blood glucose levels.  Improve your cholesterol levels.  Manage your blood pressure. Your health care provider may recommend working with a diet and nutrition specialist (dietitian) to make a meal plan that is best for you. What are tips for following this plan? Lifestyle  Set weight loss goals with the help of your health care team. It is recommended that most people with prediabetes lose 7% of their current body weight.  Exercise for at least 30 minutes at least 5 days a week.  Attend a support group or seek ongoing support from a mental health counselor.  Take over-the-counter and prescription medicines only as told by your health care provider. Reading food labels  Read food labels to check the amount of fat, salt (sodium), and sugar in prepackaged foods. Avoid foods that have: ? Saturated fats. ? Trans fats. ? Added sugars.  Avoid foods that have more than 300 milligrams (mg) of sodium per serving. Limit your daily sodium intake to less than 2,300 mg each day. Shopping  Avoid buying pre-made and processed foods. Cooking  Cook with olive oil. Do not use butter, lard, or ghee.  Bake, broil, grill, or boil foods.  Avoid frying. Meal planning   Work with your dietitian to develop an eating plan that is right for you. This may include: ? Tracking how many calories you take in. Use a food diary, notebook, or mobile application to track what you eat at each meal. ? Using the glycemic index (GI) to plan your meals. The index tells you how quickly a food will raise your blood glucose. Choose low-GI foods. These foods take a longer time to raise blood glucose.  Consider following a Mediterranean diet. This diet includes: ? Several servings each day of fresh fruits and vegetables. ? Eating fish at least twice a week. ? Several servings each day of whole grains, beans, nuts, and seeds. ? Using olive oil instead of other fats. ? Moderate alcohol consumption. ? Eating small amounts of red meat and whole-fat dairy.  If you have high blood pressure, you may need to limit your sodium intake or follow a diet such as the DASH eating plan. DASH is an eating plan that aims to lower high blood pressure. What foods are recommended? The items listed below may not be a complete list. Talk with your dietitian about what dietary choices are best for you. Grains Whole grains, such as whole-wheat or whole-grain breads, crackers, cereals, and pasta. Unsweetened oatmeal. Bulgur. Barley. Quinoa. Brown rice. Corn or whole-wheat flour tortillas or taco shells. Vegetables Lettuce. Spinach. Peas. Beets. Cauliflower. Cabbage. Broccoli. Carrots. Tomatoes. Squash. Eggplant. Herbs. Peppers. Onions. Cucumbers. Brussels sprouts. Fruits Berries. Bananas. Apples. Oranges. Grapes. Papaya. Mango. Pomegranate. Kiwi. Grapefruit. Cherries. Meats and other protein  foods Seafood. Poultry without skin. Lean cuts of pork and beef. Tofu. Eggs. Nuts. Beans. Dairy Low-fat or fat-free dairy products, such as yogurt, cottage cheese, and cheese. Beverages Water. Tea. Coffee. Sugar-free or diet soda. Seltzer water. Lowfat or no-fat milk. Milk  alternatives, such as soy or almond milk. Fats and oils Olive oil. Canola oil. Sunflower oil. Grapeseed oil. Avocado. Walnuts. Sweets and desserts Sugar-free or low-fat pudding. Sugar-free or low-fat ice cream and other frozen treats. Seasoning and other foods Herbs. Sodium-free spices. Mustard. Relish. Low-fat, low-sugar ketchup. Low-fat, low-sugar barbecue sauce. Low-fat or fat-free mayonnaise. What foods are not recommended? The items listed below may not be a complete list. Talk with your dietitian about what dietary choices are best for you. Grains Refined white flour and flour products, such as bread, pasta, snack foods, and cereals. Vegetables Canned vegetables. Frozen vegetables with butter or cream sauce. Fruits Fruits canned with syrup. Meats and other protein foods Fatty cuts of meat. Poultry with skin. Breaded or fried meat. Processed meats. Dairy Full-fat yogurt, cheese, or milk. Beverages Sweetened drinks, such as sweet iced tea and soda. Fats and oils Butter. Lard. Ghee. Sweets and desserts Baked goods, such as cake, cupcakes, pastries, cookies, and cheesecake. Seasoning and other foods Spice mixes with added salt. Ketchup. Barbecue sauce. Mayonnaise. Summary  To prevent diabetes from developing, you may need to make diet and other lifestyle changes to help control blood sugar, improve cholesterol levels, and manage your blood pressure.  Set weight loss goals with the help of your health care team. It is recommended that most people with prediabetes lose 7 percent of their current body weight.  Consider following a Mediterranean diet that includes plenty of fresh fruits and vegetables, whole grains, beans, nuts, seeds, fish, lean meat, low-fat dairy, and healthy oils. This information is not intended to replace advice given to you by your health care provider. Make sure you discuss any questions you have with your health care provider. Document Revised: 10/23/2018  Document Reviewed: 09/04/2016 Elsevier Patient Education  2020 [employer].

## 2020-05-05 NOTE — Progress Notes (Signed)
Subjective:    Patient ID: Linda Mckinney, female    DOB: 1986-03-21, 34 y.o.   MRN: [id number]  HPI  This visit occurred during the SARS-CoV-2 public health emergency.  Safety protocols were in place, including screening questions prior to the visit, additional usage of staff PPE, and extensive cleaning of exam room while observing appropriate contact time as indicated for disinfecting solutions.   Ms. Linda Mckinney is a 34 year old female who presents today for complete physical.  Immunizations: -Tetanus: Completed in 2017 -Influenza: Completed -Covid-19: Completed series   Diet: She endorses a poor diet.  Exercise: She is not exercising  Eye exam: No recent exam Dental exam: Completes semi-annually  Pap Smear: Scheduled for next week  BP Readings from Last 3 Encounters:  05/05/20 118/80  09/07/19 117/81  04/12/19 120/82      Review of Systems  Constitutional: Negative for unexpected weight change.  HENT: Negative for rhinorrhea.   Eyes: Negative for visual disturbance.  Respiratory: Negative for cough and shortness of breath.   Cardiovascular: Negative for chest pain.  Gastrointestinal: Negative for constipation and diarrhea.  Genitourinary: Negative for difficulty urinating and menstrual problem.  Musculoskeletal: Negative for arthralgias and myalgias.  Skin: Negative for rash.  Allergic/Immunologic: Negative for environmental allergies.  Neurological: Positive for headaches. Negative for dizziness and numbness.  Psychiatric/Behavioral: The patient is nervous/anxious.        Past Medical History:  Diagnosis Date  . Hypertension      Social History   Socioeconomic History  . Marital status: Single    Spouse name: Not on file  . Number of children: 0  . Years of education: Not on file  . Highest education level: Associate degree: occupational, Scientist, product/process development, or vocational program  Occupational History    Comment: Nurse Tech  Tobacco Use  . Smoking status:  Never Smoker  . Smokeless tobacco: Never Used  Substance and Sexual Activity  . Alcohol use: No    Alcohol/week: 0.0 standard drinks  . Drug use: No  . Sexual activity: Yes    Partners: Male    Birth control/protection: Condom  Other Topics Concern  . Not on file  Social History Narrative   Single.   No children.    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    Social Determinants of Health   Financial Resource Strain:   . Difficulty of Paying Living Expenses: Not on file  Food Insecurity:   . Worried About Programme researcher, broadcasting/film/video in the Last Year: Not on file  . Ran Out of Food in the Last Year: Not on file  Transportation Needs:   . Lack of Transportation (Medical): Not on file  . Lack of Transportation (Non-Medical): Not on file  Physical Activity:   . Days of Exercise per Week: Not on file  . Minutes of Exercise per Session: Not on file  Stress:   . Feeling of Stress : Not on file  Social Connections:   . Frequency of Communication with Friends and Family: Not on file  . Frequency of Social Gatherings with Friends and Family: Not on file  . Attends Religious Services: Not on file  . Active Member of Clubs or Organizations: Not on file  . Attends Banker Meetings: Not on file  . Marital Status: Not on file  Intimate Partner Violence:   . Fear of Current or Ex-Partner: Not on file  . Emotionally Abused: Not on file  .  Physically Abused: Not on file  . Sexually Abused: Not on file    Past Surgical History:  Procedure Laterality Date  . BUNIONECTOMY  2015  . CHOLECYSTECTOMY      Family History  Problem Relation Age of Onset  . Hypertension Mother   . Diabetes Father   . Stroke Father   . Heart disease Father   . Cancer Maternal Grandmother 60       breast  . Hypertension Sister     No Known Allergies  Current Outpatient Medications on File Prior to Visit  Medication Sig Dispense Refill  . Calcium Carbonate Antacid (ANTACID  PO) Take 1 capsule by mouth daily.    Marland Kitchen losartan (COZAAR) 100 MG tablet TAKE 1 TABLET BY MOUTH DAILY FOR BLOOD PRESSURE 90 tablet 1   No current facility-administered medications on file prior to visit.    BP 118/80   Pulse 68   Temp 98.6 F (37 C) (Temporal)   Ht 5\' 2"  (1.575 m)   Wt 246 lb (111.6 kg)   SpO2 100%   BMI 44.99 kg/m    Objective:   Physical Exam HENT:     Right Ear: Tympanic membrane and ear canal normal.     Left Ear: Tympanic membrane and ear canal normal.  Eyes:     Pupils: Pupils are equal, round, and reactive to light.  Cardiovascular:     Rate and Rhythm: Normal rate and regular rhythm.  Pulmonary:     Effort: Pulmonary effort is normal.     Breath sounds: Normal breath sounds.  Abdominal:     General: Bowel sounds are normal.     Palpations: Abdomen is soft.     Tenderness: There is no abdominal tenderness.  Musculoskeletal:        General: Normal range of motion.     Cervical back: Neck supple.  Skin:    General: Skin is warm and dry.  Neurological:     Mental Status: She is alert and oriented to person, place, and time.     Cranial Nerves: No cranial nerve deficit.     Deep Tendon Reflexes:     Reflex Scores:      Patellar reflexes are 2+ on the right side and 2+ on the left side. Psychiatric:        Mood and Affect: Mood normal.            Assessment & Plan:

## 2020-05-05 NOTE — Assessment & Plan Note (Signed)
Due to stress from school. Provided encouragement. Offered therapy for which she kindly declines. She will notify me if she changes her mind.

## 2020-05-05 NOTE — Assessment & Plan Note (Signed)
Immunizations UTD. Pap smear due, will get next week at GYN office. Discussed the importance of a healthy diet and regular exercise in order for weight loss, and to reduce the risk of any potential medical problems.  Exam today unremarkable. Labs reviewed.

## 2020-05-05 NOTE — Assessment & Plan Note (Signed)
Gradual increase in A1C over the years, now at 6.0.  Strongly advised she work on a healthy diet, regular exercise. Handout provided for prediabetes eating plan.  Repeat in 6 months.

## 2020-05-05 NOTE — Assessment & Plan Note (Signed)
Well controlled in the office today, continue losartan 100 mg. CMP reviewed.

## 2020-05-05 NOTE — Assessment & Plan Note (Signed)
Discussed the importance of a healthy diet and regular exercise in order for weight loss, and to reduce the risk of any potential medical problems.  

## 2020-05-09 ENCOUNTER — Ambulatory Visit (INDEPENDENT_AMBULATORY_CARE_PROVIDER_SITE_OTHER): Payer: 59 | Admitting: Obstetrics & Gynecology

## 2020-05-09 ENCOUNTER — Other Ambulatory Visit (HOSPITAL_COMMUNITY)
Admission: RE | Admit: 2020-05-09 | Discharge: 2020-05-09 | Disposition: A | Discharge status: Home / Self Care | Payer: 59 | Source: Ambulatory Visit | Attending: Obstetrics & Gynecology | Admitting: Obstetrics & Gynecology

## 2020-05-09 ENCOUNTER — Other Ambulatory Visit: Payer: Self-pay

## 2020-05-09 ENCOUNTER — Encounter: Payer: Self-pay | Admitting: Obstetrics & Gynecology

## 2020-05-09 VITALS — BP 137/86 | HR 97 | Ht 66.0 in | Wt 251.0 lb

## 2020-05-09 DIAGNOSIS — Z01419 Encounter for gynecological examination (general) (routine) without abnormal findings: Secondary | ICD-10-CM | POA: Diagnosis not present

## 2020-05-09 NOTE — Progress Notes (Signed)
GYNECOLOGY ANNUAL PREVENTATIVE CARE ENCOUNTER NOTE  History:     Linda Mckinney is a 34 y.o. G0 female here for a routine annual gynecologic exam.  Patient is sexually active; she uses condoms all the time. No plans for pregnancy anytime soon.  Current complaints: none   Denies abnormal vaginal bleeding, discharge, pelvic pain, problems with intercourse or other gynecologic concerns.    Gynecologic History Patient's last menstrual period was 04/11/2020 (approximate). Contraception: condoms Last Pap: 04/03/17. Results were: normal with negative HPV   Obstetric History OB History  Gravida Para Term Preterm AB Living  0 0 0 0 0 0  SAB TAB Ectopic Multiple Live Births  0 0 0 0 0    Past Medical History:  Diagnosis Date  . Hypertension     Past Surgical History:  Procedure Laterality Date  . BUNIONECTOMY  2015  . CHOLECYSTECTOMY      Current Outpatient Medications on File Prior to Visit  Medication Sig Dispense Refill  . Calcium Carbonate Antacid (ANTACID PO) Take 1 capsule by mouth daily.    Marland Kitchen losartan (COZAAR) 100 MG tablet TAKE 1 TABLET BY MOUTH DAILY FOR BLOOD PRESSURE 90 tablet 1   No current facility-administered medications on file prior to visit.    No Known Allergies  Social History:  reports that she has never smoked. She has never used smokeless tobacco. She reports that she does not drink alcohol and does not use drugs.  Family History  Problem Relation Age of Onset  . Hypertension Mother   . Diabetes Father   . Stroke Father   . Heart disease Father   . Cancer Maternal Grandmother 60       breast  . Hypertension Sister     The following portions of the patient's history were reviewed and updated as appropriate: allergies, current medications, past family history, past medical history, past social history, past surgical history and problem list.  Review of Systems Pertinent items noted in HPI and remainder of comprehensive ROS otherwise  negative.  Physical Exam:  BP 137/86   Pulse 97   Ht 5\' 6"  (1.676 m)   Wt 251 lb (113.9 kg)   LMP 04/11/2020 (Approximate)   BMI 40.51 kg/m  CONSTITUTIONAL: Well-developed, well-nourished female in no acute distress.  HENT:  Normocephalic, atraumatic, External right and left ear normal. Oropharynx is clear and moist EYES: Conjunctivae and EOM are normal. Pupils are equal, round, and reactive to light. No scleral icterus.  NECK: Normal range of motion, supple, no masses.  Normal thyroid.  SKIN: Skin is warm and dry. No rash noted. Not diaphoretic. No erythema. No pallor. MUSCULOSKELETAL: Normal range of motion. No tenderness.  No cyanosis, clubbing, or edema.  2+ distal pulses. NEUROLOGIC: Alert and oriented to person, place, and time. Normal reflexes, muscle tone coordination.  PSYCHIATRIC: Normal mood and affect. Normal behavior. Normal judgment and thought content. CARDIOVASCULAR: Normal heart rate noted, regular rhythm RESPIRATORY: Clear to auscultation bilaterally. Effort and breath sounds normal, no problems with respiration noted. BREASTS: Symmetric in size. No masses, tenderness, skin changes, nipple drainage, or lymphadenopathy bilaterally. Performed in the presence of a chaperone. ABDOMEN: Soft, no distention noted.  No tenderness, rebound or guarding.  PELVIC: Normal appearing external genitalia and urethral meatus; normal appearing vaginal mucosa and cervix.  Bloody discharge noted.  Pap smear obtained.   Unable to palpate uterus or adnexa fully secondary to habitus.   Performed in the presence of a chaperone.   Assessment  and Plan:      1. Well woman exam with routine gynecological exam - Cytology - PAP Will follow up results of pap smear and manage accordingly. Routine preventative health maintenance measures emphasized. Please refer to After Visit Summary for other counseling recommendations.      Jaynie Collins, MD, FACOG Obstetrician & Gynecologist, [facility] [employer], [facility]

## 2020-05-09 NOTE — Patient Instructions (Signed)
Preventive Care 21-36 Years Old, Female Preventive care refers to visits with your health care provider and lifestyle choices that can promote health and wellness. This includes:  A yearly physical exam. This may also be called an annual well check.  Regular dental visits and eye exams.  Immunizations.  Screening for certain conditions.  Healthy lifestyle choices, such as eating a healthy diet, getting regular exercise, not using drugs or products that contain nicotine and tobacco, and limiting alcohol use. What can I expect for my preventive care visit? Physical exam Your health care provider will check your:  Height and weight. This may be used to calculate body mass index (BMI), which tells if you are at a healthy weight.  Heart rate and blood pressure.  Skin for abnormal spots. Counseling Your health care provider may ask you questions about your:  Alcohol, tobacco, and drug use.  Emotional well-being.  Home and relationship well-being.  Sexual activity.  Eating habits.  Work and work environment.  Method of birth control.  Menstrual cycle.  Pregnancy history. What immunizations do I need?  Influenza (flu) vaccine  This is recommended every year. Tetanus, diphtheria, and pertussis (Tdap) vaccine  You may need a Td booster every 10 years. Varicella (chickenpox) vaccine  You may need this if you have not been vaccinated. Human papillomavirus (HPV) vaccine  If recommended by your health care provider, you may need three doses over 6 months. Measles, mumps, and rubella (MMR) vaccine  You may need at least one dose of MMR. You may also need a second dose. Meningococcal conjugate (MenACWY) vaccine  One dose is recommended if you are age 19-21 years and a first-year college student living in a residence hall, or if you have one of several medical conditions. You may also need additional booster doses. Pneumococcal conjugate (PCV13) vaccine  You may need  this if you have certain conditions and were not previously vaccinated. Pneumococcal polysaccharide (PPSV23) vaccine  You may need one or two doses if you smoke cigarettes or if you have certain conditions. Hepatitis A vaccine  You may need this if you have certain conditions or if you travel or work in places where you may be exposed to hepatitis A. Hepatitis B vaccine  You may need this if you have certain conditions or if you travel or work in places where you may be exposed to hepatitis B. Haemophilus influenzae type b (Hib) vaccine  You may need this if you have certain conditions. You may receive vaccines as individual doses or as more than one vaccine together in one shot (combination vaccines). Talk with your health care provider about the risks and benefits of combination vaccines. What tests do I need?  Blood tests  Lipid and cholesterol levels. These may be checked every 5 years starting at age 20.  Hepatitis C test.  Hepatitis B test. Screening  Diabetes screening. This is done by checking your blood sugar (glucose) after you have not eaten for a while (fasting).  Sexually transmitted disease (STD) testing.  BRCA-related cancer screening. This may be done if you have a family history of breast, ovarian, tubal, or peritoneal cancers.  Pelvic exam and Pap test. This may be done every 3 years starting at age 21. Starting at age 30, this may be done every 5 years if you have a Pap test in combination with an HPV test. Talk with your health care provider about your test results, treatment options, and if necessary, the need for more tests.   Follow these instructions at home: Eating and drinking   Eat a diet that includes fresh fruits and vegetables, whole grains, lean protein, and low-fat dairy.  Take vitamin and mineral supplements as recommended by your health care provider.  Do not drink alcohol if: ? Your health care provider tells you not to drink. ? You are  pregnant, may be pregnant, or are planning to become pregnant.  If you drink alcohol: ? Limit how much you have to 0-1 drink a day. ? Be aware of how much alcohol is in your drink. In the [place] one drink equals one 12 oz bottle of beer (355 mL), one 5 oz glass of wine (148 mL), or one 1 oz glass of hard liquor (44 mL). Lifestyle  Take daily care of your teeth and gums.  Stay active. Exercise for at least 30 minutes on 5 or more days each week.  Do not use any products that contain nicotine or tobacco, such as cigarettes, e-cigarettes, and chewing tobacco. If you need help quitting, ask your health care provider.  If you are sexually active, practice safe sex. Use a condom or other form of birth control (contraception) in order to prevent pregnancy and STIs (sexually transmitted infections). If you plan to become pregnant, see your health care provider for a preconception visit. What's next?  Visit your health care provider once a year for a well check visit.  Ask your health care provider how often you should have your eyes and teeth checked.  Stay up to date on all vaccines. This information is not intended to replace advice given to you by your health care provider. Make sure you discuss any questions you have with your health care provider. Document Revised: 03/12/2018 Document Reviewed: 03/12/2018 Elsevier Patient Education  2020 [employer].

## 2020-05-11 LAB — CYTOLOGY - PAP
Comment: NEGATIVE
Diagnosis: NEGATIVE
High risk HPV: NEGATIVE

## 2020-06-19 DIAGNOSIS — G5603 Carpal tunnel syndrome, bilateral upper limbs: Secondary | ICD-10-CM | POA: Diagnosis not present

## 2020-06-23 ENCOUNTER — Other Ambulatory Visit: Payer: Self-pay

## 2020-06-23 ENCOUNTER — Other Ambulatory Visit: Payer: Self-pay | Admitting: Primary Care

## 2020-06-23 DIAGNOSIS — I10 Essential (primary) hypertension: Secondary | ICD-10-CM

## 2020-06-23 MED FILL — LOSARTAN POTASSIUM 100 MG T: 100 | 90 days supply | Qty: 90 | Fill #0

## 2020-07-25 DIAGNOSIS — M9902 Segmental and somatic dysfunction of thoracic region: Secondary | ICD-10-CM | POA: Diagnosis not present

## 2020-07-25 DIAGNOSIS — M9905 Segmental and somatic dysfunction of pelvic region: Secondary | ICD-10-CM | POA: Diagnosis not present

## 2020-07-25 DIAGNOSIS — M62830 Muscle spasm of back: Secondary | ICD-10-CM | POA: Diagnosis not present

## 2020-07-25 DIAGNOSIS — M546 Pain in thoracic spine: Secondary | ICD-10-CM | POA: Diagnosis not present

## 2020-07-25 DIAGNOSIS — M531 Cervicobrachial syndrome: Secondary | ICD-10-CM | POA: Diagnosis not present

## 2020-07-25 DIAGNOSIS — M545 Low back pain, unspecified: Secondary | ICD-10-CM | POA: Diagnosis not present

## 2020-07-25 DIAGNOSIS — M6080 Other myositis, unspecified site: Secondary | ICD-10-CM | POA: Diagnosis not present

## 2020-07-25 DIAGNOSIS — M9901 Segmental and somatic dysfunction of cervical region: Secondary | ICD-10-CM | POA: Diagnosis not present

## 2020-07-25 DIAGNOSIS — M9903 Segmental and somatic dysfunction of lumbar region: Secondary | ICD-10-CM | POA: Diagnosis not present

## 2020-09-14 DIAGNOSIS — H5213 Myopia, bilateral: Secondary | ICD-10-CM | POA: Diagnosis not present

## 2020-09-18 DIAGNOSIS — G5603 Carpal tunnel syndrome, bilateral upper limbs: Secondary | ICD-10-CM | POA: Diagnosis not present

## 2020-10-11 MED FILL — LOSARTAN POTASSIUM 100 MG T: 100 | 90 days supply | Qty: 90 | Fill #1

## 2020-10-24 ENCOUNTER — Other Ambulatory Visit: Payer: Self-pay | Admitting: Primary Care

## 2020-10-24 DIAGNOSIS — R7303 Prediabetes: Secondary | ICD-10-CM

## 2020-10-24 NOTE — Telephone Encounter (Signed)
Called patient only symptom has been slight headache. Have reviewed all red words with patient if any will go to ED. Has been taking BP meds daily. I have made visit for tomorrow.

## 2020-10-24 NOTE — Telephone Encounter (Signed)
Noted  

## 2020-10-25 ENCOUNTER — Other Ambulatory Visit: Payer: Self-pay

## 2020-10-25 ENCOUNTER — Other Ambulatory Visit (HOSPITAL_COMMUNITY): Payer: Self-pay

## 2020-10-25 ENCOUNTER — Encounter: Payer: Self-pay | Admitting: Primary Care

## 2020-10-25 ENCOUNTER — Ambulatory Visit: Payer: 59 | Admitting: Primary Care

## 2020-10-25 VITALS — BP 144/84 | HR 88 | Temp 98.7°F | Ht 66.0 in | Wt 254.0 lb

## 2020-10-25 DIAGNOSIS — I10 Essential (primary) hypertension: Secondary | ICD-10-CM | POA: Diagnosis not present

## 2020-10-25 MED ORDER — HYDROCHLOROTHIAZIDE 12.5 MG PO TABS
12.5000 mg | ORAL_TABLET | Freq: Every day | ORAL | 0 refills | Status: DC
  Filled 2020-10-25: qty 30, 30d supply, fill #0

## 2020-10-25 NOTE — Patient Instructions (Signed)
Continue losartan 100 mg daily for blood pressure. Try taking this in the evening.  Start taking hydrochlorothiazide 12.5 mg once daily for blood pressure. Take this in the morning.  Please schedule a follow up visit to meet back with me in 2-3 weeks for blood pressure check.   It was a pleasure to see you today!   [email or web address]">  DASH Eating Plan DASH stands for Dietary Approaches to Stop Hypertension. The DASH eating plan is a healthy eating plan that has been shown to:  Reduce high blood pressure (hypertension).  Reduce your risk for type 2 diabetes, heart disease, and stroke.  Help with weight loss. What are tips for following this plan? Reading food labels  Check food labels for the amount of salt (sodium) per serving. Choose foods with less than 5 percent of the Daily Value of sodium. Generally, foods with less than 300 milligrams (mg) of sodium per serving fit into this eating plan.  To find whole grains, look for the word "whole" as the first word in the ingredient list. Shopping  Buy products labeled as "low-sodium" or "no salt added."  Buy fresh foods. Avoid canned foods and pre-made or frozen meals. Cooking  Avoid adding salt when cooking. Use salt-free seasonings or herbs instead of table salt or sea salt. Check with your health care provider or pharmacist before using salt substitutes.  Do not fry foods. Cook foods using healthy methods such as baking, boiling, grilling, roasting, and broiling instead.  Cook with heart-healthy oils, such as olive, canola, avocado, soybean, or sunflower oil. Meal planning  Eat a balanced diet that includes: ? 4 or more servings of fruits and 4 or more servings of vegetables each day. Try to fill one-half of your plate with fruits and vegetables. ? 6-8 servings of whole grains each day. ? Less than 6 oz (170 g) of lean meat, poultry, or fish each day. A 3-oz (85-g) serving of meat  is about the same size as a deck of cards. One egg equals 1 oz (28 g). ? 2-3 servings of low-fat dairy each day. One serving is 1 cup (237 mL). ? 1 serving of nuts, seeds, or beans 5 times each week. ? 2-3 servings of heart-healthy fats. Healthy fats called omega-3 fatty acids are found in foods such as walnuts, flaxseeds, fortified milks, and eggs. These fats are also found in cold-water fish, such as sardines, salmon, and mackerel.  Limit how much you eat of: ? Canned or prepackaged foods. ? Food that is high in trans fat, such as some fried foods. ? Food that is high in saturated fat, such as fatty meat. ? Desserts and other sweets, sugary drinks, and other foods with added sugar. ? Full-fat dairy products.  Do not salt foods before eating.  Do not eat more than 4 egg yolks a week.  Try to eat at least 2 vegetarian meals a week.  Eat more home-cooked food and less restaurant, buffet, and fast food.   Lifestyle  When eating at a restaurant, ask that your food be prepared with less salt or no salt, if possible.  If you drink alcohol: ? Limit how much you use to:  0-1 drink a day for women who are not pregnant.  0-2 drinks a day for men. ? Be aware of how much alcohol is in your drink. In the [place] one drink equals one 12 oz bottle of beer (355 mL), one 5 oz glass of wine (148 mL), or one 1  oz glass of hard liquor (44 mL). General information  Avoid eating more than 2,300 mg of salt a day. If you have hypertension, you may need to reduce your sodium intake to 1,500 mg a day.  Work with your health care provider to maintain a healthy body weight or to lose weight. Ask what an ideal weight is for you.  Get at least 30 minutes of exercise that causes your heart to beat faster (aerobic exercise) most days of the week. Activities may include walking, swimming, or biking.  Work with your health care provider or dietitian to adjust your eating plan to your individual calorie  needs. What foods should I eat? Fruits All fresh, dried, or frozen fruit. Canned fruit in natural juice (without added sugar). Vegetables Fresh or frozen vegetables (raw, steamed, roasted, or grilled). Low-sodium or reduced-sodium tomato and vegetable juice. Low-sodium or reduced-sodium tomato sauce and tomato paste. Low-sodium or reduced-sodium canned vegetables. Grains Whole-grain or whole-wheat bread. Whole-grain or whole-wheat pasta. Brown rice. Orpah Cobb. Bulgur. Whole-grain and low-sodium cereals. Pita bread. Low-fat, low-sodium crackers. Whole-wheat flour tortillas. Meats and other proteins Skinless chicken or [place]. Ground chicken or [place]. Pork with fat trimmed off. Fish and seafood. Egg whites. Dried beans, peas, or lentils. Unsalted nuts, nut butters, and seeds. Unsalted canned beans. Lean cuts of beef with fat trimmed off. Low-sodium, lean precooked or cured meat, such as sausages or meat loaves. Dairy Low-fat (1%) or fat-free (skim) milk. Reduced-fat, low-fat, or fat-free cheeses. Nonfat, low-sodium ricotta or cottage cheese. Low-fat or nonfat yogurt. Low-fat, low-sodium cheese. Fats and oils Soft margarine without trans fats. Vegetable oil. Reduced-fat, low-fat, or light mayonnaise and salad dressings (reduced-sodium). Canola, safflower, olive, avocado, soybean, and sunflower oils. Avocado. Seasonings and condiments Herbs. Spices. Seasoning mixes without salt. Other foods Unsalted popcorn and pretzels. Fat-free sweets. The items listed above may not be a complete list of foods and beverages you can eat. Contact a dietitian for more information. What foods should I avoid? Fruits Canned fruit in a light or heavy syrup. Fried fruit. Fruit in cream or butter sauce. Vegetables Creamed or fried vegetables. Vegetables in a cheese sauce. Regular canned vegetables (not low-sodium or reduced-sodium). Regular canned tomato sauce and paste (not low-sodium or reduced-sodium). Regular  tomato and vegetable juice (not low-sodium or reduced-sodium). Rosita Fire. Olives. Grains Baked goods made with fat, such as croissants, muffins, or some breads. Dry pasta or rice meal packs. Meats and other proteins Fatty cuts of meat. Ribs. Fried meat. Tomasa Blase. Bologna, salami, and other precooked or cured meats, such as sausages or meat loaves. Fat from the back of a pig (fatback). Bratwurst. Salted nuts and seeds. Canned beans with added salt. Canned or smoked fish. Whole eggs or egg yolks. Chicken or [place] with skin. Dairy Whole or 2% milk, cream, and half-and-half. Whole or full-fat cream cheese. Whole-fat or sweetened yogurt. Full-fat cheese. Nondairy creamers. Whipped toppings. Processed cheese and cheese spreads. Fats and oils Butter. Stick margarine. Lard. Shortening. Ghee. Bacon fat. Tropical oils, such as coconut, palm kernel, or palm oil. Seasonings and condiments Onion salt, garlic salt, seasoned salt, table salt, and sea salt. Worcestershire sauce. Tartar sauce. Barbecue sauce. Teriyaki sauce. Soy sauce, including reduced-sodium. Steak sauce. Canned and packaged gravies. Fish sauce. Oyster sauce. Cocktail sauce. Store-bought horseradish. Ketchup. Mustard. Meat flavorings and tenderizers. Bouillon cubes. Hot sauces. Pre-made or packaged marinades. Pre-made or packaged taco seasonings. Relishes. Regular salad dressings. Other foods Salted popcorn and pretzels. The items listed above may not be a  complete list of foods and beverages you should avoid. Contact a dietitian for more information. Where to find more information  National Heart, Lung, and Blood Institute: PopSteam.is  American Heart Association: www.heart.org  Academy of Nutrition and Dietetics: www.eatright.org  National Kidney Foundation: www.kidney.org Summary  The DASH eating plan is a healthy eating plan that has been shown to reduce high blood pressure (hypertension). It may also reduce your risk for type 2  diabetes, heart disease, and stroke.  When on the DASH eating plan, aim to eat more fresh fruits and vegetables, whole grains, lean proteins, low-fat dairy, and heart-healthy fats.  With the DASH eating plan, you should limit salt (sodium) intake to 2,300 mg a day. If you have hypertension, you may need to reduce your sodium intake to 1,500 mg a day.  Work with your health care provider or dietitian to adjust your eating plan to your individual calorie needs. This information is not intended to replace advice given to you by your health care provider. Make sure you discuss any questions you have with your health care provider. Document Revised: 06/04/2019 Document Reviewed: 06/04/2019 Elsevier Patient Education  2021 [employer].

## 2020-10-25 NOTE — Progress Notes (Signed)
Subjective:    Patient ID: Linda Mckinney, female    DOB: 01-03-1986, 35 y.o.   MRN: [id number]  HPI  Linda Mckinney is a very pleasant 35 y.o. female with a history of hypertension, obesity, prediabetes who presents today for evaluation of hypertension.  She sent a message via My Chart yesterday with reports of elevated blood pressure readings over the last week running 145/94 and 141/91. She was at her dentist's office yesterday and BP was 155/105, 157/113, and 171/100, 150/90. Given these readings she was instructed to come in today for evaluation.  Today she endorses developing a headache last week which prompted her to check her BP. She is compliant to her losartan 100 mg daily. She was once managed on amlodipine 10 mg, not sure why she came off, also on HCTZ in the past and had no issues.   She endorses a poor diet and is not exercising. Her headache has resolved as of this afternoon. She joined [employer] and hasn't been staying on track.   BP Readings from Last 3 Encounters:  10/25/20 (!) 144/84  05/09/20 137/86  05/05/20 118/80   Wt Readings from Last 3 Encounters:  10/25/20 254 lb (115.2 kg)  05/09/20 251 lb (113.9 kg)  05/05/20 246 lb (111.6 kg)       Review of Systems  Eyes: Negative for visual disturbance.  Respiratory: Negative for shortness of breath.   Cardiovascular: Negative for chest pain.  Neurological: Positive for headaches. Negative for dizziness.         Past Medical History:  Diagnosis Date  . Hypertension     Social History   Socioeconomic History  . Marital status: Single    Spouse name: Not on file  . Number of children: 0  . Years of education: Not on file  . Highest education level: Associate degree: occupational, Scientist, product/process development, or vocational program  Occupational History    Comment: Nurse Tech  Tobacco Use  . Smoking status: Never Smoker  . Smokeless tobacco: Never Used  Vaping Use  . Vaping Use: Never used  Substance  and Sexual Activity  . Alcohol use: No    Alcohol/week: 0.0 standard drinks  . Drug use: No  . Sexual activity: Yes    Partners: Male    Birth control/protection: Condom  Other Topics Concern  . Not on file  Social History Narrative   Single.   No children.    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    Social Determinants of Health   Financial Resource Strain: Not on file  Food Insecurity: Not on file  Transportation Needs: Not on file  Physical Activity: Not on file  Stress: Not on file  Social Connections: Not on file  Intimate Partner Violence: Not on file    Past Surgical History:  Procedure Laterality Date  . BUNIONECTOMY  2015  . CHOLECYSTECTOMY      Family History  Problem Relation Age of Onset  . Hypertension Mother   . Diabetes Father   . Stroke Father   . Heart disease Father   . Cancer Maternal Grandmother 60       breast  . Hypertension Sister     No Known Allergies  Current Outpatient Medications on File Prior to Visit  Medication Sig Dispense Refill  . Calcium Carbonate Antacid (ANTACID PO) Take 1 capsule by mouth daily.    Marland Kitchen losartan (COZAAR) 100 MG tablet TAKE 1 TABLET BY MOUTH  DAILY FOR BLOOD PRESSURE (Patient taking differently: Take by mouth daily. for blood pressure) 90 tablet 3   No current facility-administered medications on file prior to visit.    BP (!) 144/84   Pulse 88   Temp 98.7 F (37.1 C) (Temporal)   Ht 5\' 6"  (1.676 m)   Wt 254 lb (115.2 kg)   SpO2 98%   BMI 41.00 kg/m  Objective:   Physical Exam Cardiovascular:     Rate and Rhythm: Normal rate and regular rhythm.  Pulmonary:     Effort: Pulmonary effort is normal.     Breath sounds: Normal breath sounds.  Musculoskeletal:     Cervical back: Neck supple.  Skin:    General: Skin is warm and dry.           Assessment & Plan:      This visit occurred during the SARS-CoV-2 public health emergency.  Safety protocols were in place,  including screening questions prior to the visit, additional usage of staff PPE, and extensive cleaning of exam room while observing appropriate contact time as indicated for disinfecting solutions.

## 2020-10-25 NOTE — Assessment & Plan Note (Signed)
Above goal today, also during last visit and recently at dentist's office.  Weight gain over the last 6 months, unhealthy diet, is not exercising. Discussed the absolute need to work on lifestyle changes.   Continue losartan 100 mg, add HCTZ 12.5 mg daily. We will see her back in 2-3 weeks for BP check and BMP.

## 2020-11-01 ENCOUNTER — Ambulatory Visit: Payer: 59 | Admitting: Primary Care

## 2020-11-02 ENCOUNTER — Other Ambulatory Visit: Payer: Self-pay

## 2020-11-06 ENCOUNTER — Other Ambulatory Visit: Payer: 59

## 2020-11-08 ENCOUNTER — Other Ambulatory Visit: Payer: Self-pay

## 2020-11-08 ENCOUNTER — Ambulatory Visit: Payer: 59 | Admitting: Primary Care

## 2020-11-08 ENCOUNTER — Encounter: Payer: Self-pay | Admitting: Primary Care

## 2020-11-08 ENCOUNTER — Other Ambulatory Visit: Payer: Self-pay | Admitting: Primary Care

## 2020-11-08 VITALS — BP 136/82 | HR 89 | Temp 98.7°F | Ht 66.0 in | Wt 245.0 lb

## 2020-11-08 DIAGNOSIS — I10 Essential (primary) hypertension: Secondary | ICD-10-CM

## 2020-11-08 DIAGNOSIS — R7303 Prediabetes: Secondary | ICD-10-CM

## 2020-11-08 LAB — HEMOGLOBIN A1C: Hgb A1c MFr Bld: 6.1 % (ref 4.6–6.5)

## 2020-11-08 LAB — BASIC METABOLIC PANEL
BUN: 22 mg/dL (ref 6–23)
CO2: 26 mEq/L (ref 19–32)
Calcium: 9.6 mg/dL (ref 8.4–10.5)
Chloride: 102 mEq/L (ref 96–112)
Creatinine, Ser: 1.1 mg/dL (ref 0.40–1.20)
GFR: 64.35 mL/min (ref >60.00)
Glucose, Bld: 110 mg/dL — ABNORMAL HIGH (ref 70–99)
Potassium: 4.1 mEq/L (ref 3.5–5.1)
Sodium: 136 mEq/L (ref 135–145)

## 2020-11-08 MED ORDER — HYDROCHLOROTHIAZIDE 12.5 MG PO TABS
12.5000 mg | ORAL_TABLET | Freq: Every day | ORAL | 3 refills | Status: DC
  Filled 2020-11-08 – 2020-11-27 (×2): qty 90, 90d supply, fill #0
  Filled 2021-03-12: qty 90, 90d supply, fill #1
  Filled 2021-07-11: qty 90, 90d supply, fill #2
  Filled 2021-08-30 – 2021-10-18 (×2): qty 90, 90d supply, fill #3

## 2020-11-08 NOTE — Assessment & Plan Note (Signed)
Improved on HCTZ 12.5 mg but still above goal. I did commend her on her 11 pound weight loss!!  She is motivated to continue with weight loss so we will continue HCTZ 12.5 mg and losartan 100 mg for now. She will continue to work on weight loss.  We will plan to see her back in 6 months for CPE and follow up.

## 2020-11-08 NOTE — Patient Instructions (Signed)
Stop by the lab prior to leaving today. I will notify you of your results once received.   Continue to work on a healthy diet. Ensure you are consuming 64 ounces of water daily.  Start exercising. You should be getting 150 minutes of moderate intensity exercise weekly.  Please schedule a physical to meet with me for October 2022.    It was a pleasure to see you today!

## 2020-11-08 NOTE — Progress Notes (Signed)
Subjective:    Patient ID: Linda Mckinney, female    DOB: [birth year], 35 y.o.   MRN: [id number]  HPI  Linda Mckinney is a very pleasant 35 y.o. female with a history of hypertension, prediabetes, obesity who presents today for follow up of hypertension and prediabetes.  Currently managed on losartan 100 mg and HCTZ 12.5 mg. She was last evaluated on 10/25/20 for follow up of hypertension, BP was above goal at 144/84 so we added HCTZ 12.5 mg daily. She is here today for follow up.  Since her last visit she's worked on her diet and has lost nearly 10 pounds! She is cooking at home, drinking mostly water, is eating chicken and [place].  She is compliant to her losartan 100 mg and HCTZ 12.5 mg.   Wt Readings from Last 3 Encounters:  11/08/20 245 lb (111.1 kg)  10/25/20 254 lb (115.2 kg)  05/09/20 251 lb (113.9 kg)     BP Readings from Last 3 Encounters:  11/08/20 136/82  10/25/20 (!) 144/84  05/09/20 137/86        Review of Systems  Eyes: Negative for visual disturbance.  Respiratory: Negative for shortness of breath.   Cardiovascular: Negative for chest pain.  Neurological: Negative for dizziness and headaches.         Past Medical History:  Diagnosis Date  . Hypertension     Social History   Socioeconomic History  . Marital status: Single    Spouse name: Not on file  . Number of children: 0  . Years of education: Not on file  . Highest education level: Associate degree: occupational, Scientist, product/process development, or vocational program  Occupational History    Comment: Nurse Tech  Tobacco Use  . Smoking status: Never Smoker  . Smokeless tobacco: Never Used  Vaping Use  . Vaping Use: Never used  Substance and Sexual Activity  . Alcohol use: No    Alcohol/week: 0.0 standard drinks  . Drug use: No  . Sexual activity: Yes    Partners: Male    Birth control/protection: Condom  Other Topics Concern  . Not on file  Social History Narrative   Single.   No children.     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    Social Determinants of Health   Financial Resource Strain: Not on file  Food Insecurity: Not on file  Transportation Needs: Not on file  Physical Activity: Not on file  Stress: Not on file  Social Connections: Not on file  Intimate Partner Violence: Not on file    Past Surgical History:  Procedure Laterality Date  . BUNIONECTOMY  2015  . CHOLECYSTECTOMY      Family History  Problem Relation Age of Onset  . Hypertension Mother   . Diabetes Father   . Stroke Father   . Heart disease Father   . Cancer Maternal Grandmother 60       breast  . Hypertension Sister     No Known Allergies  Current Outpatient Medications on File Prior to Visit  Medication Sig Dispense Refill  . Calcium Carbonate Antacid (ANTACID PO) Take 1 capsule by mouth daily.    . hydrochlorothiazide (HYDRODIURIL) 12.5 MG tablet Take 1 tablet (12.5 mg total) by mouth daily. For blood pressure. 30 tablet 0  . losartan (COZAAR) 100 MG tablet TAKE 1 TABLET BY MOUTH DAILY FOR BLOOD PRESSURE (Patient taking differently: Take by mouth daily. for blood pressure) 90 tablet 3  No current facility-administered medications on file prior to visit.    BP 136/82   Pulse 89   Temp 98.7 F (37.1 C) (Temporal)   Ht 5\' 6"  (1.676 m)   Wt 245 lb (111.1 kg)   SpO2 98%   BMI 39.54 kg/m  Objective:   Physical Exam Cardiovascular:     Rate and Rhythm: Normal rate and regular rhythm.  Pulmonary:     Effort: Pulmonary effort is normal.     Breath sounds: Normal breath sounds.  Musculoskeletal:     Cervical back: Neck supple.  Skin:    General: Skin is warm and dry.           Assessment & Plan:      This visit occurred during the SARS-CoV-2 public health emergency.  Safety protocols were in place, including screening questions prior to the visit, additional usage of staff PPE, and extensive cleaning of exam room while observing appropriate contact  time as indicated for disinfecting solutions.

## 2020-11-08 NOTE — Assessment & Plan Note (Signed)
Commended her on the 11 pound weight loss! Repeat A1C pending

## 2020-11-09 ENCOUNTER — Other Ambulatory Visit (HOSPITAL_COMMUNITY): Payer: Self-pay

## 2020-11-28 ENCOUNTER — Other Ambulatory Visit (HOSPITAL_COMMUNITY): Payer: Self-pay

## 2020-11-29 ENCOUNTER — Other Ambulatory Visit (HOSPITAL_COMMUNITY): Payer: Self-pay

## 2020-12-05 ENCOUNTER — Encounter: Payer: Self-pay | Admitting: Podiatry

## 2020-12-05 ENCOUNTER — Ambulatory Visit (INDEPENDENT_AMBULATORY_CARE_PROVIDER_SITE_OTHER): Payer: 59

## 2020-12-05 ENCOUNTER — Other Ambulatory Visit: Payer: Self-pay | Admitting: Podiatry

## 2020-12-05 ENCOUNTER — Other Ambulatory Visit (HOSPITAL_COMMUNITY): Payer: Self-pay

## 2020-12-05 ENCOUNTER — Ambulatory Visit (INDEPENDENT_AMBULATORY_CARE_PROVIDER_SITE_OTHER): Payer: 59 | Admitting: Podiatry

## 2020-12-05 ENCOUNTER — Other Ambulatory Visit: Payer: Self-pay

## 2020-12-05 DIAGNOSIS — Q666 Other congenital valgus deformities of feet: Secondary | ICD-10-CM | POA: Diagnosis not present

## 2020-12-05 DIAGNOSIS — M76821 Posterior tibial tendinitis, right leg: Secondary | ICD-10-CM

## 2020-12-05 DIAGNOSIS — M722 Plantar fascial fibromatosis: Secondary | ICD-10-CM

## 2020-12-05 MED ORDER — METHYLPREDNISOLONE 4 MG PO TBPK
ORAL_TABLET | ORAL | 0 refills | Status: DC
  Filled 2020-12-05: qty 21, 6d supply, fill #0

## 2020-12-05 MED ORDER — DEXAMETHASONE SODIUM PHOSPHATE 120 MG/30ML IJ SOLN
2.0000 mg | Freq: Once | INTRAMUSCULAR | Status: AC
  Administered 2020-12-05: 2 mg via INTRA_ARTICULAR

## 2020-12-05 MED ORDER — CELECOXIB 100 MG PO CAPS
100.0000 mg | ORAL_CAPSULE | Freq: Two times a day (BID) | ORAL | 3 refills | Status: DC
  Filled 2020-12-05: qty 60, 30d supply, fill #0
  Filled 2021-01-30: qty 60, 30d supply, fill #1
  Filled 2021-03-12: qty 60, 30d supply, fill #2
  Filled 2021-05-24: qty 60, 30d supply, fill #3

## 2020-12-06 NOTE — Progress Notes (Signed)
She presents today states that her medial right foot is been aching and burning for the past few months she like to see about getting some new shoes which when she did she develop pain along the plantar medial aspect of the arch just below the navicular bone.  She states that the left foot does perfectly well the right 1 is painful.  She states that she would like to get some new orthotics.  Objective: Vital signs are stable alert oriented x3.  Pulses are palpable.  She has severe pes planovalgus bilaterally with a corrected posterior tibial tendon repair left she is also had bunion repair left she has pain on palpation of the posterior tibial tendon right and at this navicular tuberosity right.  Radiographs taken today demonstrate pes planovalgus no fractures are identified.  No acute findings are noted.  Soft tissue edema along the medial ankle.  Assessment: Pes planovalgus bilateral insertional posterior tibial tendinitis right.  Plan: Discussed etiology pathology conservative surgical therapies she was casted today for new orthotics.  Started her on Celebrex 100 mg 1 p.o. twice daily #60 with 3 refills and also started her on methylprednisolone.  I would like to follow-up with her in 1 month.  I injected the area today with 2 mg of dexamethasone local anesthetic to the maximal area of tenderness.

## 2020-12-18 DIAGNOSIS — G5603 Carpal tunnel syndrome, bilateral upper limbs: Secondary | ICD-10-CM | POA: Diagnosis not present

## 2020-12-25 ENCOUNTER — Telehealth: Payer: Self-pay | Admitting: Podiatry

## 2020-12-25 NOTE — Telephone Encounter (Signed)
Orthotics in.. lvm for pt the orthotics are in and she is scheduled to pick them up on 6.21 but we do have some available appts prior if she would like to get them sooner to call to schedule an appt if not she can pick them up at that appt.

## 2020-12-26 NOTE — Telephone Encounter (Signed)
Pt returned my call and left message.  I called her back and she is going to pick up the orthotics at her appt on 6.21. She asked about balance and she does have a balance of 298.16 and she said she would have to wait and I explained that she could take them if she wanted but we require at least 150.00 down and make payments.

## 2021-01-02 ENCOUNTER — Encounter: Payer: Self-pay | Admitting: Podiatry

## 2021-01-02 ENCOUNTER — Other Ambulatory Visit: Payer: Self-pay

## 2021-01-02 ENCOUNTER — Ambulatory Visit (INDEPENDENT_AMBULATORY_CARE_PROVIDER_SITE_OTHER): Payer: 59 | Admitting: Podiatry

## 2021-01-02 DIAGNOSIS — M76821 Posterior tibial tendinitis, right leg: Secondary | ICD-10-CM | POA: Diagnosis not present

## 2021-01-02 NOTE — Progress Notes (Signed)
She presents today to pick up her orthotics she states that her posterior tibial tendinitis of the right foot is doing a lot better continues to take her Celebrex on a regular basis.  Objective: Vital signs stable alert oriented x3 severe pes planovalgus deformity.  Much decrease in fluctuance within the tendon sheath right.  Assessment: Well-healing posterior tibial tendinitis p pes planus bilateral.  Plan: Follow-up with me in 6 weeks dispensed orthotics today with oral and written home-going instructions.

## 2021-01-22 DIAGNOSIS — G5603 Carpal tunnel syndrome, bilateral upper limbs: Secondary | ICD-10-CM | POA: Diagnosis not present

## 2021-01-23 ENCOUNTER — Other Ambulatory Visit: Payer: Self-pay

## 2021-01-23 ENCOUNTER — Encounter (HOSPITAL_BASED_OUTPATIENT_CLINIC_OR_DEPARTMENT_OTHER): Payer: Self-pay | Admitting: Orthopedic Surgery

## 2021-01-23 ENCOUNTER — Other Ambulatory Visit: Payer: Self-pay | Admitting: Orthopedic Surgery

## 2021-01-24 ENCOUNTER — Encounter (HOSPITAL_BASED_OUTPATIENT_CLINIC_OR_DEPARTMENT_OTHER)
Admission: RE | Admit: 2021-01-24 | Discharge: 2021-01-24 | Disposition: A | Discharge status: Home / Self Care | Payer: 59 | Source: Ambulatory Visit | Attending: Orthopedic Surgery | Admitting: Orthopedic Surgery

## 2021-01-24 ENCOUNTER — Other Ambulatory Visit: Payer: Self-pay

## 2021-01-24 DIAGNOSIS — Z01810 Encounter for preprocedural cardiovascular examination: Secondary | ICD-10-CM | POA: Diagnosis not present

## 2021-01-24 LAB — BASIC METABOLIC PANEL
Anion gap: 8 (ref 5–15)
BUN: 14 mg/dL (ref 6–20)
CO2: 25 mmol/L (ref 22–32)
Calcium: 9.4 mg/dL (ref 8.9–10.3)
Chloride: 106 mmol/L (ref 98–111)
Creatinine, Ser: 0.97 mg/dL (ref 0.44–1.00)
GFR, Estimated: >60 mL/min (ref >60)
Glucose, Bld: 99 mg/dL (ref 70–99)
Potassium: 3.5 mmol/L (ref 3.5–5.1)
Sodium: 139 mmol/L (ref 135–145)

## 2021-01-24 NOTE — Progress Notes (Signed)

## 2021-01-30 ENCOUNTER — Ambulatory Visit (HOSPITAL_BASED_OUTPATIENT_CLINIC_OR_DEPARTMENT_OTHER): Payer: 59 | Admitting: Certified Registered"

## 2021-01-30 ENCOUNTER — Other Ambulatory Visit: Payer: Self-pay

## 2021-01-30 ENCOUNTER — Encounter (HOSPITAL_BASED_OUTPATIENT_CLINIC_OR_DEPARTMENT_OTHER): Payer: Self-pay | Admitting: Orthopedic Surgery

## 2021-01-30 ENCOUNTER — Ambulatory Visit (HOSPITAL_BASED_OUTPATIENT_CLINIC_OR_DEPARTMENT_OTHER)
Admission: RE | Admit: 2021-01-30 | Discharge: 2021-01-30 | Disposition: A | Discharge status: Home / Self Care | Payer: 59 | Attending: Orthopedic Surgery | Admitting: Orthopedic Surgery

## 2021-01-30 ENCOUNTER — Other Ambulatory Visit (HOSPITAL_COMMUNITY): Payer: Self-pay

## 2021-01-30 ENCOUNTER — Encounter (HOSPITAL_BASED_OUTPATIENT_CLINIC_OR_DEPARTMENT_OTHER)
Admission: RE | Disposition: A | Discharge status: Home / Self Care | Payer: Self-pay | Source: Home / Self Care | Attending: Orthopedic Surgery

## 2021-01-30 DIAGNOSIS — R7303 Prediabetes: Secondary | ICD-10-CM | POA: Diagnosis not present

## 2021-01-30 DIAGNOSIS — Z6841 Body Mass Index (BMI) 40.0 and over, adult: Secondary | ICD-10-CM | POA: Diagnosis not present

## 2021-01-30 DIAGNOSIS — Z823 Family history of stroke: Secondary | ICD-10-CM | POA: Insufficient documentation

## 2021-01-30 DIAGNOSIS — Z79899 Other long term (current) drug therapy: Secondary | ICD-10-CM | POA: Insufficient documentation

## 2021-01-30 DIAGNOSIS — Z791 Long term (current) use of non-steroidal anti-inflammatories (NSAID): Secondary | ICD-10-CM | POA: Diagnosis not present

## 2021-01-30 DIAGNOSIS — Z8249 Family history of ischemic heart disease and other diseases of the circulatory system: Secondary | ICD-10-CM | POA: Diagnosis not present

## 2021-01-30 DIAGNOSIS — K219 Gastro-esophageal reflux disease without esophagitis: Secondary | ICD-10-CM | POA: Diagnosis not present

## 2021-01-30 DIAGNOSIS — G5603 Carpal tunnel syndrome, bilateral upper limbs: Secondary | ICD-10-CM | POA: Diagnosis not present

## 2021-01-30 DIAGNOSIS — Z8261 Family history of arthritis: Secondary | ICD-10-CM | POA: Insufficient documentation

## 2021-01-30 DIAGNOSIS — Z803 Family history of malignant neoplasm of breast: Secondary | ICD-10-CM | POA: Diagnosis not present

## 2021-01-30 DIAGNOSIS — Z8349 Family history of other endocrine, nutritional and metabolic diseases: Secondary | ICD-10-CM | POA: Diagnosis not present

## 2021-01-30 DIAGNOSIS — G5601 Carpal tunnel syndrome, right upper limb: Secondary | ICD-10-CM | POA: Diagnosis not present

## 2021-01-30 DIAGNOSIS — I10 Essential (primary) hypertension: Secondary | ICD-10-CM | POA: Diagnosis not present

## 2021-01-30 DIAGNOSIS — Z833 Family history of diabetes mellitus: Secondary | ICD-10-CM | POA: Diagnosis not present

## 2021-01-30 HISTORY — PX: CARPAL TUNNEL RELEASE: SHX101

## 2021-01-30 LAB — POCT PREGNANCY, URINE: Preg Test, Ur: NEGATIVE

## 2021-01-30 SURGERY — CARPAL TUNNEL RELEASE
Anesthesia: Monitor Anesthesia Care | Laterality: Right

## 2021-01-30 MED ORDER — FENTANYL CITRATE (PF) 100 MCG/2ML IJ SOLN
INTRAMUSCULAR | Status: DC | PRN
  Administered 2021-01-30 (×2): 50 ug via INTRAVENOUS

## 2021-01-30 MED ORDER — CEFAZOLIN SODIUM-DEXTROSE 2-4 GM/100ML-% IV SOLN
INTRAVENOUS | Status: AC
  Filled 2021-01-30: qty 100

## 2021-01-30 MED ORDER — CEFAZOLIN SODIUM-DEXTROSE 2-4 GM/100ML-% IV SOLN
2.0000 g | INTRAVENOUS | Status: AC
  Administered 2021-01-30: 2 g via INTRAVENOUS

## 2021-01-30 MED ORDER — ACETAMINOPHEN 500 MG PO TABS
1000.0000 mg | ORAL_TABLET | Freq: Once | ORAL | Status: AC
  Administered 2021-01-30: 1000 mg via ORAL

## 2021-01-30 MED ORDER — LIDOCAINE HCL (PF) 2 % IJ SOLN
INTRAMUSCULAR | Status: AC
  Filled 2021-01-30: qty 10

## 2021-01-30 MED ORDER — BUPIVACAINE HCL (PF) 0.25 % IJ SOLN
INTRAMUSCULAR | Status: DC | PRN
  Administered 2021-01-30: 10 mL

## 2021-01-30 MED ORDER — DEXMEDETOMIDINE (PRECEDEX) IN NS 20 MCG/5ML (4 MCG/ML) IV SYRINGE
PREFILLED_SYRINGE | INTRAVENOUS | Status: DC | PRN
  Administered 2021-01-30: 12 ug via INTRAVENOUS

## 2021-01-30 MED ORDER — FENTANYL CITRATE (PF) 100 MCG/2ML IJ SOLN
INTRAMUSCULAR | Status: AC
  Filled 2021-01-30: qty 2

## 2021-01-30 MED ORDER — LIDOCAINE HCL (CARDIAC) PF 100 MG/5ML IV SOSY
PREFILLED_SYRINGE | INTRAVENOUS | Status: DC | PRN
  Administered 2021-01-30: 30 mg via INTRAVENOUS

## 2021-01-30 MED ORDER — PROPOFOL 500 MG/50ML IV EMUL
INTRAVENOUS | Status: DC | PRN
  Administered 2021-01-30: 75 ug/kg/min via INTRAVENOUS

## 2021-01-30 MED ORDER — FENTANYL CITRATE (PF) 100 MCG/2ML IJ SOLN: 25.0000 ug | INTRAMUSCULAR | Status: DC | PRN

## 2021-01-30 MED ORDER — TRAMADOL HCL 50 MG PO TABS
50.0000 mg | ORAL_TABLET | Freq: Four times a day (QID) | ORAL | 0 refills | Status: DC | PRN
  Filled 2021-01-30: qty 20, 5d supply, fill #0

## 2021-01-30 MED ORDER — MIDAZOLAM HCL 5 MG/5ML IJ SOLN
INTRAMUSCULAR | Status: DC | PRN
  Administered 2021-01-30: 2 mg via INTRAVENOUS

## 2021-01-30 MED ORDER — LACTATED RINGERS IV SOLN: INTRAVENOUS | Status: DC

## 2021-01-30 MED ORDER — DEXMEDETOMIDINE (PRECEDEX) IN NS 20 MCG/5ML (4 MCG/ML) IV SYRINGE
PREFILLED_SYRINGE | INTRAVENOUS | Status: AC
  Filled 2021-01-30: qty 20

## 2021-01-30 MED ORDER — 0.9 % SODIUM CHLORIDE (POUR BTL) OPTIME
TOPICAL | Status: DC | PRN
  Administered 2021-01-30: 500 mL

## 2021-01-30 MED ORDER — ACETAMINOPHEN 500 MG PO TABS
ORAL_TABLET | ORAL | Status: AC
  Filled 2021-01-30: qty 2

## 2021-01-30 MED ORDER — MIDAZOLAM HCL 2 MG/2ML IJ SOLN
INTRAMUSCULAR | Status: AC
  Filled 2021-01-30: qty 2

## 2021-01-30 MED ORDER — ONDANSETRON HCL 4 MG/2ML IJ SOLN
INTRAMUSCULAR | Status: DC | PRN
Start: 2021-01-30 — End: 2021-01-30
  Administered 2021-01-30: 4 mg via INTRAVENOUS

## 2021-01-30 MED ORDER — ONDANSETRON HCL 4 MG/2ML IJ SOLN
INTRAMUSCULAR | Status: AC
  Filled 2021-01-30: qty 8

## 2021-01-30 MED FILL — Losartan Potassium Tab 100 MG: ORAL | 90 days supply | Qty: 90 | Fill #0 | Status: AC

## 2021-01-30 SURGICAL SUPPLY — 32 items
APL PRP STRL LF DISP 70% ISPRP (MISCELLANEOUS) ×1
BLADE SURG 15 STRL LF DISP TIS (BLADE) ×1 IMPLANT
BLADE SURG 15 STRL SS (BLADE) ×2
BNDG CMPR 9X4 STRL LF SNTH (GAUZE/BANDAGES/DRESSINGS)
BNDG COHESIVE 3X5 TAN STRL LF (GAUZE/BANDAGES/DRESSINGS) ×2 IMPLANT
BNDG ESMARK 4X9 LF (GAUZE/BANDAGES/DRESSINGS) IMPLANT
BNDG GAUZE ELAST 4 BULKY (GAUZE/BANDAGES/DRESSINGS) ×2 IMPLANT
CHLORAPREP W/TINT 26 (MISCELLANEOUS) ×2 IMPLANT
CORD BIPOLAR FORCEPS 12FT (ELECTRODE) ×2 IMPLANT
COVER BACK TABLE 60X90IN (DRAPES) ×2 IMPLANT
COVER MAYO STAND STRL (DRAPES) ×2 IMPLANT
CUFF TOURN SGL QUICK 18X4 (TOURNIQUET CUFF) ×2 IMPLANT
DRAPE EXTREMITY T 121X128X90 (DISPOSABLE) ×2 IMPLANT
DRAPE SURG 17X23 STRL (DRAPES) ×2 IMPLANT
DRSG PAD ABDOMINAL 8X10 ST (GAUZE/BANDAGES/DRESSINGS) ×2 IMPLANT
GAUZE SPONGE 4X4 12PLY STRL (GAUZE/BANDAGES/DRESSINGS) ×2 IMPLANT
GAUZE XEROFORM 1X8 LF (GAUZE/BANDAGES/DRESSINGS) ×2 IMPLANT
GLOVE SURG ORTHO LTX SZ8 (GLOVE) ×2 IMPLANT
GLOVE SURG UNDER POLY LF SZ8.5 (GLOVE) ×2 IMPLANT
GOWN STRL REUS W/ TWL LRG LVL3 (GOWN DISPOSABLE) ×1 IMPLANT
GOWN STRL REUS W/TWL LRG LVL3 (GOWN DISPOSABLE) ×2
GOWN STRL REUS W/TWL XL LVL3 (GOWN DISPOSABLE) ×2 IMPLANT
NEEDLE PRECISIONGLIDE 27X1.5 (NEEDLE) IMPLANT
NS IRRIG 1000ML POUR BTL (IV SOLUTION) ×2 IMPLANT
PACK BASIN DAY SURGERY FS (CUSTOM PROCEDURE TRAY) ×2 IMPLANT
STOCKINETTE 4X48 STRL (DRAPES) ×2 IMPLANT
SUT ETHILON 4 0 PS 2 18 (SUTURE) ×2 IMPLANT
SUT VICRYL 4-0 PS2 18IN ABS (SUTURE) IMPLANT
SYR BULB EAR ULCER 3OZ GRN STR (SYRINGE) ×2 IMPLANT
SYR CONTROL 10ML LL (SYRINGE) IMPLANT
TOWEL GREEN STERILE FF (TOWEL DISPOSABLE) ×2 IMPLANT
UNDERPAD 30X36 HEAVY ABSORB (UNDERPADS AND DIAPERS) ×2 IMPLANT

## 2021-01-30 NOTE — H&P (Signed)
Linda Mckinney is an 35 y.o. female.   Chief Complaint: Numbness right hand HPI: Linda Mckinney is a 35 year old right-hand-dominant female referred by Dr. Adam Phenix for consultation regarding numbness and tingling both hands right greater than left. This been going on since November. She has no history of injury to the hand or to the neck. She has seen a chiropractor who did an adjustment but did not help significantly with the numbness and tingling of all fingers right greater than left. She has no particular activity that seems to make it better or worse. She has taken Profen Tylenol together. Awaken 3-4 out of 7 nights. She is been using splints. She has had nerve conductions done revealing bilateral carpal tunnel syndrome. Reveal a motor delay of 8 on her right 6 on her left no response to the median sensory component on her right a 5 ms delay on her left. He has borderline diabetes no history of thyroid problems arthritis gout. Family history is positive diabetes thyroid problems arthritis negative for gout.She has had nerve conductions done in the past revealing a right side motor latency of 8.2 and 6.3 on the left. Sensory component showed no response on the right side and 5.0 latency on the left. She has had injections to both carpal canals in the past. She continues complain of the numbness and tingling pain. Past Medical History:  Diagnosis Date   Hypertension     Past Surgical History:  Procedure Laterality Date   BUNIONECTOMY  2015   CHOLECYSTECTOMY      Family History  Problem Relation Age of Onset   Hypertension Mother    Diabetes Father    Stroke Father    Heart disease Father    Cancer Maternal Grandmother 81       breast   Hypertension Sister    Social History:  reports that she has never smoked. She has never used smokeless tobacco. She reports that she does not drink alcohol and does not use drugs.  Allergies: No Known Allergies  Medications Prior to Admission   Medication Sig Dispense Refill   Calcium Carbonate Antacid (ANTACID PO) Take 1 capsule by mouth daily.     celecoxib (CELEBREX) 100 MG capsule Take 1 capsule (100 mg total) by mouth 2 (two) times daily. 60 capsule 3   hydrochlorothiazide (HYDRODIURIL) 12.5 MG tablet Take 1 tablet (12.5 mg total) by mouth daily. For blood pressure. 90 tablet 3   losartan (COZAAR) 100 MG tablet TAKE 1 TABLET BY MOUTH DAILY FOR BLOOD PRESSURE 90 tablet 3    No results found for this or any previous visit (from the past 48 hour(s)).  No results found.   Pertinent items are noted in HPI.  Height 5\' 6"  (1.676 m), weight 113.4 kg, last menstrual period 01/17/2021.  General appearance: alert, cooperative, and appears stated age Head: Normocephalic, without obvious abnormality Neck: no JVD Resp: clear to auscultation bilaterally Cardio: regular rate and rhythm, S1, S2 normal, no murmur, click, rub or gallop GI: soft non tender Extremities: Numbness right hand Pulses: 2+ and symmetric Skin: Skin color, texture, turgor normal. No rashes or lesions Neurologic: Grossly normal Incision/Wound: na  Assessment/Plan Assessment:  1. Bilateral carpal tunnel syndrome    Plan: We have discussed possibility of surgical decompression of the right side. Prepare and postoperative course been discussed along with risk complications. She is aware there is no guarantee to the surgery the possibility of infection recurrence injury to arteries nerves tendons complete relief symptoms dystrophy. She  is scheduled for right carpal tunnel release in outpatient under regional anesthesia.   Cindee Salt 01/30/2021, 9:27 AM

## 2021-01-30 NOTE — Op Note (Signed)
NAME: Linda Mckinney MEDICAL RECORD NO: [id number] DATE OF BIRTH: March 07, 1986 FACILITY: [facility] LOCATION: [facility] PHYSICIAN: Nicki Reaper, MD   OPERATIVE REPORT   DATE OF PROCEDURE: 01/30/21    PREOPERATIVE DIAGNOSIS: Carpal tunnel syndrome right hand   POSTOPERATIVE DIAGNOSIS: Same   PROCEDURE: Decompression median nerve right hand   SURGEON: Cindee Salt, M.D.   ASSISTANT: none   ANESTHESIA:  Bier block with sedation and Local with sedation   INTRAVENOUS FLUIDS:  Per anesthesia flow sheet.   ESTIMATED BLOOD LOSS:  Minimal.   COMPLICATIONS:  None.   SPECIMENS:  none   TOURNIQUET TIME:    Total Tourniquet Time Documented: Forearm (Right) - 23 minutes Total: Forearm (Right) - 23 minutes    DISPOSITION:  Stable to PACU.   INDICATIONS: Patient is a 35 year old female with history of numbness and tingling bilateral hands. Responded to conservative treatment.  Nerve conductions reveal bilateral carpal tunnel syndrome.  She has elected undergo surgical decompression of median nerve right side.  Pre-.  Postoperative course been discussed along with risk complications.  She is aware that there is no guarantee to the surgery the possibility of infection recurrence injury to arteries nerves tendons incomplete relief symptoms and dystrophy.  The preoperative area the patient is seen extremity marked by both patient and surgeon antibiotic given  OPERATIVE COURSE: Patient is brought the operating room where a forearm-based IV regional anesthetic was carried out without difficulty after placing patient in supine position with the right arm free.  Prep was done with ChloraPrep 3-minute dry time was allowed and timeout taken to confirm patient procedure.  A longitudinal incision was made in the right palm carried down through subcutaneous tissue.  Bleeders were electrocauterized with bipolar.  Palmar fascia was split.  The superficial palmar arch was identified along  with the flexor tendon of the ring little finger.  Retractors were placed retracting median nerve radially ulnar nerve ulnarly and the flexor retinaculum was released on its ulnar border.  A right angle and stool retractor placed between skin and forearm fascia after transverse muscle superficial to the flexor retinaculum was identified this was protected the dissection carried beneath this after placement of the stool retractor.  A right angle retractor was also placed this allowed visualization of the median nerve this was dissected free from the overlying flexor retinaculum was then released along with the distal forearm fascia for approximately 2 cm proximal to the wrist crease under direct vision.  The canal was explored.  Motor branch entered into muscle distally.  Area compression with hyperemia to the nerve was apparent.  No further lesions were identified.  The wound was copious irrigated with saline.  Skin was closed erupted 4-0 nylon sutures.  Local infiltration quarter percent bupivacaine without epinephrine was given 10 cc was used.  Sterile compressive dressing with fingers free was applied.  Deflation of the tourniquet all fingers immediately pink.  She was taken to the recovery room for observation in satisfactory condition.  She will be discharged home to return to the [facility] center [facility] in 1 week and Tylenol ibuprofen for pain with Ultram for breakthrough.   Cindee Salt, MD Electronically signed, 01/30/21

## 2021-01-30 NOTE — Transfer of Care (Signed)
Immediate Anesthesia Transfer of Care Note  Patient: Linda Mckinney  Procedure(s) Performed: RIGHT CARPAL TUNNEL RELEASE (Right)  Patient Location: PACU  Anesthesia Type:Bier block  Level of Consciousness: awake, alert , oriented and patient cooperative  Airway & Oxygen Therapy: Patient Spontanous Breathing and Patient connected to face mask oxygen  Post-op Assessment: Report given to RN and Post -op Vital signs reviewed and stable  Post vital signs: Reviewed and stable  Last Vitals:  Vitals Value Taken Time  BP    Temp    Pulse 83 01/30/21 1109  Resp 20 01/30/21 1109  SpO2 96 % 01/30/21 1109  Vitals shown include unvalidated device data.  Last Pain:  Vitals:   01/30/21 0941  TempSrc: Oral  PainSc: 0-No pain      Patients Stated Pain Goal: 4 (01/30/21 0941)  Complications: No notable events documented.

## 2021-01-30 NOTE — Anesthesia Preprocedure Evaluation (Addendum)
Anesthesia Evaluation  Patient identified by MRN, date of birth, ID band Patient awake    Reviewed: Allergy & Precautions, NPO status , Patient's Chart, lab work & pertinent test results  Airway Mallampati: II  TM Distance: >3 FB Neck ROM: Full  Mouth opening: Limited Mouth Opening  Dental  (+) Teeth Intact, Dental Advisory Given   Pulmonary neg pulmonary ROS,    Pulmonary exam normal breath sounds clear to auscultation       Cardiovascular hypertension, Pt. on medications Normal cardiovascular exam Rhythm:Regular Rate:Normal     Neuro/Psych negative neurological ROS  negative psych ROS   GI/Hepatic Neg liver ROS, GERD  Medicated and Controlled,  Endo/Other  Morbid obesity (BMI 44)  Renal/GU negative Renal ROS  negative genitourinary   Musculoskeletal negative musculoskeletal ROS (+)   Abdominal   Peds  Hematology negative hematology ROS (+)   Anesthesia Other Findings   Reproductive/Obstetrics                            Anesthesia Physical Anesthesia Plan  ASA: 3  Anesthesia Plan: MAC and Bier Block and Bier Block-LIDOCAINE ONLY   Post-op Pain Management:  Regional for Post-op pain   Induction: Intravenous  PONV Risk Score and Plan: 2 and Propofol infusion, Treatment may vary due to age or medical condition, Midazolam and Ondansetron  Airway Management Planned: Natural Airway  Additional Equipment:   Intra-op Plan:   Post-operative Plan:   Informed Consent: I have reviewed the patients History and Physical, chart, labs and discussed the procedure including the risks, benefits and alternatives for the proposed anesthesia with the patient or authorized representative who has indicated his/her understanding and acceptance.     Dental advisory given  Plan Discussed with: CRNA  Anesthesia Plan Comments:         Anesthesia Quick Evaluation

## 2021-01-30 NOTE — Discharge Instructions (Addendum)
[facility] Instructions Hand Surgery  Wound Care: Keep your hand elevated above the level of your heart.  Do not allow it to dangle by your side.  Keep the dressing dry and do not remove it unless your doctor advises you to do so.  He will usually change it at the time of your post-op visit.  Moving your fingers is advised to stimulate circulation but will depend on the site of your surgery.  If you have a splint applied, your doctor will advise you regarding movement.  Activity: Do not drive or operate machinery today.  Rest today and then you may return to your normal activity and work as indicated by your physician.  Diet:  Drink liquids today or eat a light diet.  You may resume a regular diet tomorrow.    General expectations: Pain for two to three days. Fingers may become slightly swollen.  Call your doctor if any of the following occur: Severe pain not relieved by pain medication. Elevated temperature. Dressing soaked with blood. Inability to move fingers. White or bluish color to fingers.    No Tylenol before 4:00pm if needed.   Post Anesthesia Home Care Instructions  Activity: Get plenty of rest for the remainder of the day. A responsible individual must stay with you for 24 hours following the procedure.  For the next 24 hours, DO NOT: -Drive a car -Advertising copywriter -Drink alcoholic beverages -Take any medication unless instructed by your physician -Make any legal decisions or sign important papers.  Meals: Start with liquid foods such as gelatin or soup. Progress to regular foods as tolerated. Avoid greasy, spicy, heavy foods. If nausea and/or vomiting occur, drink only clear liquids until the nausea and/or vomiting subsides. Call your physician if vomiting continues.  Special Instructions/Symptoms: Your throat may feel dry or sore from the anesthesia or the breathing tube placed in your throat during surgery. If this causes discomfort, gargle with warm salt  water. The discomfort should disappear within 24 hours.  If you had a scopolamine patch placed behind your ear for the management of post- operative nausea and/or vomiting:  1. The medication in the patch is effective for 72 hours, after which it should be removed.  Wrap patch in a tissue and discard in the trash. Wash hands thoroughly with soap and water. 2. You may remove the patch earlier than 72 hours if you experience unpleasant side effects which may include dry mouth, dizziness or visual disturbances. 3. Avoid touching the patch. Wash your hands with soap and water after contact with the patch.

## 2021-01-30 NOTE — Brief Op Note (Signed)
01/30/2021  11:12 AM  PATIENT:  Linda Mckinney  35 y.o. female  PRE-OPERATIVE DIAGNOSIS:  RIGHT CARPAL TUNNEL SYNDROME  POST-OPERATIVE DIAGNOSIS:  RIGHT CARPAL TUNNEL SYNDROME  PROCEDURE:  Procedure(s) with comments: RIGHT CARPAL TUNNEL RELEASE (Right) - 45 MIN  SURGEON:  Surgeon(s) and Role:    * Cindee Salt, MD - Primary  PHYSICIAN ASSISTANT:   ASSISTANTS: none   ANESTHESIA:   local, regional, and IV sedation  EBL:  65ml  BLOOD ADMINISTERED:none  DRAINS: none   LOCAL MEDICATIONS USED:  BUPIVICAINE   SPECIMEN:  No Specimen  DISPOSITION OF SPECIMEN:  N/A  COUNTS:  YES  TOURNIQUET:   Total Tourniquet Time Documented: Forearm (Right) - 23 minutes Total: Forearm (Right) - 23 minutes   DICTATION: .Reubin Milan Dictation  PLAN OF CARE: Discharge to home after PACU  PATIENT DISPOSITION:  PACU - hemodynamically stable.

## 2021-01-30 NOTE — Anesthesia Procedure Notes (Signed)
Anesthesia Regional Block: Bier block (IV Regional)   Pre-Anesthetic Checklist: , timeout performed,  Correct Patient, Correct Site, Correct Laterality,  Correct Procedure,, site marked,  Surgical consent,  At surgeon's request  Laterality: Right         Needles:  Injection technique: Single-shot  Needle Type: Other      Needle Gauge: 20     Additional Needles:   Procedures:,,,,, intact distal pulses, Esmarch exsanguination,  Single tourniquet utilized    Narrative:   Performed by: Personally      

## 2021-01-30 NOTE — Anesthesia Procedure Notes (Signed)
Procedure Name: MAC Date/Time: 01/30/2021 11:13 AM Performed by: Signe Colt, CRNA Pre-anesthesia Checklist: Patient identified, Emergency Drugs available, Suction available and Patient being monitored Patient Re-evaluated:Patient Re-evaluated prior to induction Oxygen Delivery Method: Simple face mask

## 2021-01-30 NOTE — Anesthesia Postprocedure Evaluation (Signed)
Anesthesia Post Note  Patient: NOELANI HARBACH  Procedure(s) Performed: RIGHT CARPAL TUNNEL RELEASE (Right)     Patient location during evaluation: PACU Anesthesia Type: MAC Level of consciousness: awake and alert Pain management: pain level controlled Vital Signs Assessment: post-procedure vital signs reviewed and stable Respiratory status: spontaneous breathing, nonlabored ventilation, respiratory function stable and patient connected to nasal cannula oxygen Cardiovascular status: blood pressure returned to baseline and stable Postop Assessment: no apparent nausea or vomiting Anesthetic complications: no   No notable events documented.  Last Vitals:  Vitals:   01/30/21 1143 01/30/21 1200  BP: 111/81 117/87  Pulse: 63 61  Resp: 20 20  Temp:  36.5 C  SpO2: 93% 96%    Last Pain:  Vitals:   01/30/21 1200  TempSrc:   PainSc: 0-No pain                 Gale Klar L Jamilah Jean

## 2021-02-01 ENCOUNTER — Encounter (HOSPITAL_BASED_OUTPATIENT_CLINIC_OR_DEPARTMENT_OTHER): Payer: Self-pay | Admitting: Orthopedic Surgery

## 2021-02-13 ENCOUNTER — Ambulatory Visit (INDEPENDENT_AMBULATORY_CARE_PROVIDER_SITE_OTHER): Payer: 59 | Admitting: Podiatry

## 2021-02-13 ENCOUNTER — Other Ambulatory Visit: Payer: Self-pay

## 2021-02-13 DIAGNOSIS — M76821 Posterior tibial tendinitis, right leg: Secondary | ICD-10-CM | POA: Diagnosis not present

## 2021-02-13 NOTE — Progress Notes (Signed)
She presents today just having had carpal tunnel on her right hand performed a couple of weeks ago states that she has not been mobic with wearing the orthotics all the time but while she was wearing them she was doing much better she states that the dexamethasone injection really seem to work as well stating that she has no concerns at this point.  Objective: Vital signs are stable she alert oriented x3 no reproducible pain on palpation of the bilateral foot or posterior tibial tendon.  I cannot evaluate the orthotics today she does not have them with her.  Assessment: Posterior tibial tendinitis resolving.  Plan: Continue the use of the orthotics on a regular basis follow-up with me as needed

## 2021-03-12 ENCOUNTER — Other Ambulatory Visit (HOSPITAL_COMMUNITY): Payer: Self-pay

## 2021-04-03 ENCOUNTER — Encounter: Payer: Self-pay | Admitting: Radiology

## 2021-05-09 ENCOUNTER — Ambulatory Visit (INDEPENDENT_AMBULATORY_CARE_PROVIDER_SITE_OTHER): Payer: 59 | Admitting: Primary Care

## 2021-05-09 ENCOUNTER — Encounter: Payer: Self-pay | Admitting: Primary Care

## 2021-05-09 ENCOUNTER — Other Ambulatory Visit: Payer: Self-pay

## 2021-05-09 VITALS — BP 111/81 | HR 82 | Temp 98.6°F | Ht 63.0 in | Wt 246.0 lb

## 2021-05-09 DIAGNOSIS — G5603 Carpal tunnel syndrome, bilateral upper limbs: Secondary | ICD-10-CM

## 2021-05-09 DIAGNOSIS — M76821 Posterior tibial tendinitis, right leg: Secondary | ICD-10-CM | POA: Insufficient documentation

## 2021-05-09 DIAGNOSIS — Z6841 Body Mass Index (BMI) 40.0 and over, adult: Secondary | ICD-10-CM | POA: Diagnosis not present

## 2021-05-09 DIAGNOSIS — R7303 Prediabetes: Secondary | ICD-10-CM | POA: Diagnosis not present

## 2021-05-09 DIAGNOSIS — K219 Gastro-esophageal reflux disease without esophagitis: Secondary | ICD-10-CM

## 2021-05-09 DIAGNOSIS — Z0000 Encounter for general adult medical examination without abnormal findings: Secondary | ICD-10-CM | POA: Diagnosis not present

## 2021-05-09 DIAGNOSIS — F419 Anxiety disorder, unspecified: Secondary | ICD-10-CM

## 2021-05-09 DIAGNOSIS — I10 Essential (primary) hypertension: Secondary | ICD-10-CM

## 2021-05-09 LAB — COMPREHENSIVE METABOLIC PANEL
ALT: 17 U/L (ref 0–35)
AST: 16 U/L (ref 0–37)
Albumin: 4 g/dL (ref 3.5–5.2)
Alkaline Phosphatase: 55 U/L (ref 39–117)
BUN: 16 mg/dL (ref 6–23)
CO2: 30 mEq/L (ref 19–32)
Calcium: 9.2 mg/dL (ref 8.4–10.5)
Chloride: 104 mEq/L (ref 96–112)
Creatinine, Ser: 0.96 mg/dL (ref 0.40–1.20)
GFR: 75.51 mL/min (ref >60.00)
Glucose, Bld: 105 mg/dL — ABNORMAL HIGH (ref 70–99)
Potassium: 3.8 mEq/L (ref 3.5–5.1)
Sodium: 139 mEq/L (ref 135–145)
Total Bilirubin: 0.7 mg/dL (ref 0.2–1.2)
Total Protein: 7 g/dL (ref 6.0–8.3)

## 2021-05-09 LAB — LIPID PANEL
Cholesterol: 156 mg/dL (ref 0–200)
HDL: 45.1 mg/dL (ref >39.00)
LDL Cholesterol: 96 mg/dL (ref 0–99)
NonHDL: 110.56
Total CHOL/HDL Ratio: 3
Triglycerides: 72 mg/dL (ref 0.0–149.0)
VLDL: 14.4 mg/dL (ref 0.0–40.0)

## 2021-05-09 LAB — CBC
HCT: 39 % (ref 36.0–46.0)
Hemoglobin: 12.8 g/dL (ref 12.0–15.0)
MCHC: 32.9 g/dL (ref 30.0–36.0)
MCV: 88.7 fl (ref 78.0–100.0)
Platelets: 262 10*3/uL (ref 150.0–400.0)
RBC: 4.39 Mil/uL (ref 3.87–5.11)
RDW: 13.2 % (ref 11.5–15.5)
WBC: 8.6 10*3/uL (ref 4.0–10.5)

## 2021-05-09 LAB — HEMOGLOBIN A1C: Hgb A1c MFr Bld: 6.1 % (ref 4.6–6.5)

## 2021-05-09 NOTE — Assessment & Plan Note (Signed)
Controlled in the office today, continue HCTZ 12.5 mg and losartan 100 mg daily.   Continue same.  CMP pending.

## 2021-05-09 NOTE — Patient Instructions (Addendum)
Stop by the lab prior to leaving today. I will notify you of your results once received.   Start exercising. You should be getting 150 minutes of moderate intensity exercise weekly.  It's important to improve your diet by reducing consumption of fast food, fried food, processed snack foods, sugary drinks. Increase consumption of fresh vegetables and fruits, whole grains, water.  Ensure you are drinking 64 ounces of water daily.  Start taking omeprazole 20 mg once daily, increase to 40 mg after one week if needed. Continue daily regimen for 4-6 weeks, then message me on MyChart.   It was a pleasure to see you today!   Food Choices for Gastroesophageal Reflux Disease, Adult When you have gastroesophageal reflux disease (GERD), the foods you eat and your eating habits are very important. Choosing the right foods can help ease your discomfort. Think about working with a food expert (dietitian) to help you make good choices. What are tips for following this plan? Reading food labels Look for foods that are low in saturated fat. Foods that may help with your symptoms include: Foods that have less than 5% of daily value (DV) of fat. Foods that have 0 grams of trans fat. Cooking Do not fry your food. Cook your food by baking, steaming, grilling, or broiling. These are all methods that do not need a lot of fat for cooking. To add flavor, try to use herbs that are low in spice and acidity. Meal planning  Choose healthy foods that are low in fat, such as: Fruits and vegetables. Whole grains. Low-fat dairy products. Lean meats, fish, and poultry. Eat small meals often instead of eating 3 large meals each day. Eat your meals slowly in a place where you are relaxed. Avoid bending over or lying down until 2-3 hours after eating. Limit high-fat foods such as fatty meats or fried foods. Limit your intake of fatty foods, such as oils, butter, and shortening. Avoid the following as told by your  doctor: Foods that cause symptoms. These may be different for different people. Keep a food diary to keep track of foods that cause symptoms. Alcohol. Drinking a lot of liquid with meals. Eating meals during the 2-3 hours before bed. Lifestyle Stay at a healthy weight. Ask your doctor what weight is healthy for you. If you need to lose weight, work with your doctor to do so safely. Exercise for at least 30 minutes on 5 or more days each week, or as told by your doctor. Wear loose-fitting clothes. Do not smoke or use any products that contain nicotine or tobacco. If you need help quitting, ask your doctor. Sleep with the head of your bed higher than your feet. Use a wedge under the mattress or blocks under the bed frame to raise the head of the bed. Chew sugar-free gum after meals. What foods should eat? Eat a healthy, well-balanced diet of fruits, vegetables, whole grains, low-fat dairy products, lean meats, fish, and poultry. Each person is different. Foods that may cause symptoms in one person may not cause any symptoms in another person. Work with your doctor to find foods that are safe for you. The items listed above may not be a complete list of what you can eat and drink. Contact a food expert for more options. What foods should I avoid? Limiting some of these foods may help in managing the symptoms of GERD. Everyone is different. Talk with a food expert or your doctor to help you find the exact foods  to avoid, if any. Fruits Any fruits prepared with added fat. Any fruits that cause symptoms. For some people, this may include citrus fruits, such as oranges, grapefruit, pineapple, and lemons. Vegetables Deep-fried vegetables. [place] fries. Any vegetables prepared with added fat. Any vegetables that cause symptoms. For some people, this may include tomatoes and tomato products, chili peppers, onions and garlic, and horseradish. Grains Pastries or quick breads with added fat. Meats and  other proteins High-fat meats, such as fatty beef or pork, hot dogs, ribs, ham, sausage, salami, and bacon. Fried meat or protein, including fried fish and fried chicken. Nuts and nut butters, in large amounts. Dairy Whole milk and chocolate milk. Sour cream. Cream. Ice cream. Cream cheese. Milkshakes. Fats and oils Butter. Margarine. Shortening. Ghee. Beverages Coffee and tea, with or without caffeine. Carbonated beverages. Sodas. Energy drinks. Fruit juice made with acidic fruits, such as orange or grapefruit. Tomato juice. Alcoholic drinks. Sweets and desserts Chocolate and cocoa. Donuts. Seasonings and condiments Pepper. Peppermint and spearmint. Added salt. Any condiments, herbs, or seasonings that cause symptoms. For some people, this may include curry, hot sauce, or vinegar-based salad dressings. The items listed above may not be a complete list of what you should not eat and drink. Contact a food expert for more options. Questions to ask your doctor Diet and lifestyle changes are often the first steps that are taken to manage symptoms of GERD. If diet and lifestyle changes do not help, talk with your doctor about taking medicines. Where to find more information International Foundation for Gastrointestinal Disorders: aboutgerd.org Summary When you have GERD, food and lifestyle choices are very important in easing your symptoms. Eat small meals often instead of 3 large meals a day. Eat your meals slowly and in a place where you are relaxed. Avoid bending over or lying down until 2-3 hours after eating. Limit high-fat foods such as fatty meats or fried foods. This information is not intended to replace advice given to you by your health care provider. Make sure you discuss any questions you have with your health care provider. Document Revised: 01/10/2020 Document Reviewed: 01/10/2020 Elsevier Patient Education  2022 [employer].

## 2021-05-09 NOTE — Assessment & Plan Note (Signed)
Discussed the importance of a healthy diet and regular exercise in order for weight loss, and to reduce the risk of further co-morbidity.  

## 2021-05-09 NOTE — Assessment & Plan Note (Signed)
Discussed the importance of a healthy diet and regular exercise in order for weight loss, and to reduce the risk of further co-morbidity. ? ?Repeat A1C pending. ?

## 2021-05-09 NOTE — Assessment & Plan Note (Signed)
Frequent, using PPI inappropriately.  Recommend to start daily omeprazole 20 mg for 1 week, increase to 40 mg daily if needed. Continue daily PPI use for 4-6 weeks, then will wean back down.   Discussed triggers.  Handout provided.

## 2021-05-09 NOTE — Assessment & Plan Note (Signed)
S/P right carpal tunnel release, left side needing intervention. Following with hand specialist.   Continue celebrex 100 BID.

## 2021-05-09 NOTE — Assessment & Plan Note (Signed)
Denies concerns today. °Continue to monitor. °

## 2021-05-09 NOTE — Assessment & Plan Note (Signed)
Immunizations UTD. Pap smear UTD.  Discussed the importance of a healthy diet and regular exercise in order for weight loss, and to reduce the risk of further co-morbidity.  Exam today stable. Labs pending.  

## 2021-05-09 NOTE — Progress Notes (Signed)
Subjective:    Patient ID: Linda Mckinney, female    DOB: [birth year], 35 y.o.   MRN: [id number]  HPI  Linda Mckinney is a very pleasant 35 y.o. female who presents today for complete physical and follow up of chronic conditions.  Symptoms of heartburn which include chest pressure and burning in her throat, sour blenches at times. Symptoms occur every other day, takes 40 mg OTC every other day, with temporary relief, has been doing this regimen for months. Greasy foods are a big trigger.   Immunizations: -Tetanus: 2017 -Influenza: Completed -Covid-19: 2 vaccines   Diet: Fair diet.  Exercise: No regular exercise.  Eye exam: No recent visit  Dental exam: Completes semi-annually   Pap Smear: Completed in 2021  BP Readings from Last 3 Encounters:  05/09/21 111/81  01/30/21 117/87  11/08/20 136/82        Review of Systems  Constitutional:  Negative for unexpected weight change.  HENT:  Negative for rhinorrhea.   Eyes:  Negative for visual disturbance.  Respiratory:  Negative for cough and shortness of breath.   Cardiovascular:  Negative for chest pain.  Gastrointestinal:  Negative for constipation and diarrhea.  Genitourinary:  Negative for difficulty urinating.  Musculoskeletal:  Negative for arthralgias and myalgias.  Skin:  Negative for rash.  Allergic/Immunologic: Negative for environmental allergies.  Neurological:  Negative for dizziness and headaches.  Psychiatric/Behavioral:  The patient is not nervous/anxious.         Past Medical History:  Diagnosis Date   Hypertension     Social History   Socioeconomic History   Marital status: Single    Spouse name: Not on file   Number of children: 0   Years of education: Not on file   Highest education level: Associate degree: occupational, Scientist, product/process development, or vocational program  Occupational History    Comment: Nurse Tech  Tobacco Use   Smoking status: Never   Smokeless tobacco: Never  Vaping Use    Vaping Use: Never used  Substance and Sexual Activity   Alcohol use: No    Alcohol/week: 0.0 standard drinks   Drug use: No   Sexual activity: Yes    Partners: Male    Birth control/protection: Condom  Other Topics Concern   Not on file  Social History Narrative   Single.   No children.    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    Social Determinants of Health   Financial Resource Strain: Not on file  Food Insecurity: Not on file  Transportation Needs: Not on file  Physical Activity: Not on file  Stress: Not on file  Social Connections: Not on file  Intimate Partner Violence: Not on file    Past Surgical History:  Procedure Laterality Date   BUNIONECTOMY  2015   CARPAL TUNNEL RELEASE Right 01/30/2021   Procedure: RIGHT CARPAL TUNNEL RELEASE;  Surgeon: Cindee Salt, MD;  Location: [facility];  Service: Orthopedics;  Laterality: Right;  45 MIN   CHOLECYSTECTOMY      Family History  Problem Relation Age of Onset   Hypertension Mother    Diabetes Father    Stroke Father    Heart disease Father    Cancer Maternal Grandmother 17       breast   Hypertension Sister     No Known Allergies  Current Outpatient Medications on File Prior to Visit  Medication Sig Dispense Refill   Calcium Carbonate Antacid (ANTACID PO)  Take 1 capsule by mouth daily.     celecoxib (CELEBREX) 100 MG capsule Take 1 capsule (100 mg total) by mouth 2 (two) times daily. 60 capsule 3   hydrochlorothiazide (HYDRODIURIL) 12.5 MG tablet Take 1 tablet (12.5 mg total) by mouth daily. For blood pressure. 90 tablet 3   losartan (COZAAR) 100 MG tablet TAKE 1 TABLET BY MOUTH DAILY FOR BLOOD PRESSURE 90 tablet 3   No current facility-administered medications on file prior to visit.    BP 111/81   Pulse 82   Temp 98.6 F (37 C) (Temporal)   Ht 5\' 3"  (1.6 m)   Wt 246 lb (111.6 kg)   SpO2 98%   BMI 43.58 kg/m  Objective:   Physical Exam HENT:     Right Ear:  Tympanic membrane and ear canal normal.     Left Ear: Tympanic membrane and ear canal normal.     Nose: Nose normal.  Eyes:     Conjunctiva/sclera: Conjunctivae normal.     Pupils: Pupils are equal, round, and reactive to light.  Neck:     Thyroid: No thyromegaly.  Cardiovascular:     Rate and Rhythm: Normal rate and regular rhythm.     Heart sounds: No murmur heard. Pulmonary:     Effort: Pulmonary effort is normal.     Breath sounds: Normal breath sounds. No rales.  Abdominal:     General: Bowel sounds are normal.     Palpations: Abdomen is soft.     Tenderness: There is no abdominal tenderness.  Musculoskeletal:        General: Normal range of motion.     Cervical back: Neck supple.  Lymphadenopathy:     Cervical: No cervical adenopathy.  Skin:    General: Skin is warm and dry.     Findings: No rash.  Neurological:     Mental Status: She is alert and oriented to person, place, and time.     Cranial Nerves: No cranial nerve deficit.     Deep Tendon Reflexes: Reflexes are normal and symmetric.  Psychiatric:        Mood and Affect: Mood normal.          Assessment & Plan:      This visit occurred during the SARS-CoV-2 public health emergency.  Safety protocols were in place, including screening questions prior to the visit, additional usage of staff PPE, and extensive cleaning of exam room while observing appropriate contact time as indicated for disinfecting solutions.

## 2021-05-09 NOTE — Assessment & Plan Note (Signed)
Following with podiatry, continue Celebrex 100 mg BID.

## 2021-05-24 MED FILL — Losartan Potassium Tab 100 MG: ORAL | 90 days supply | Qty: 90 | Fill #1 | Status: AC

## 2021-05-25 ENCOUNTER — Other Ambulatory Visit (HOSPITAL_COMMUNITY): Payer: Self-pay

## 2021-07-11 ENCOUNTER — Other Ambulatory Visit (HOSPITAL_COMMUNITY): Payer: Self-pay

## 2021-08-28 DIAGNOSIS — Z0289 Encounter for other administrative examinations: Secondary | ICD-10-CM

## 2021-08-30 ENCOUNTER — Other Ambulatory Visit: Payer: Self-pay | Admitting: Podiatry

## 2021-08-30 ENCOUNTER — Other Ambulatory Visit: Payer: Self-pay | Admitting: Primary Care

## 2021-08-30 DIAGNOSIS — I10 Essential (primary) hypertension: Secondary | ICD-10-CM

## 2021-08-31 ENCOUNTER — Other Ambulatory Visit (HOSPITAL_COMMUNITY): Payer: Self-pay

## 2021-08-31 MED ORDER — CELECOXIB 100 MG PO CAPS
100.0000 mg | ORAL_CAPSULE | Freq: Two times a day (BID) | ORAL | 3 refills | Status: DC
  Filled 2021-08-31: qty 60, 30d supply, fill #0
  Filled 2021-11-13: qty 60, 30d supply, fill #1
  Filled 2022-01-09: qty 60, 30d supply, fill #2
  Filled 2022-03-11: qty 60, 30d supply, fill #3

## 2021-08-31 MED ORDER — LOSARTAN POTASSIUM 100 MG PO TABS
ORAL_TABLET | Freq: Every day | ORAL | 1 refills | Status: DC
  Filled 2021-08-31: qty 90, 90d supply, fill #0

## 2021-09-12 ENCOUNTER — Ambulatory Visit (INDEPENDENT_AMBULATORY_CARE_PROVIDER_SITE_OTHER): Payer: 59 | Admitting: Bariatrics

## 2021-09-12 ENCOUNTER — Encounter (INDEPENDENT_AMBULATORY_CARE_PROVIDER_SITE_OTHER): Payer: Self-pay | Admitting: Bariatrics

## 2021-09-12 ENCOUNTER — Other Ambulatory Visit: Payer: Self-pay

## 2021-09-12 VITALS — BP 107/63 | HR 72 | Temp 98.0°F | Ht 62.0 in | Wt 245.0 lb

## 2021-09-12 DIAGNOSIS — R5383 Other fatigue: Secondary | ICD-10-CM | POA: Diagnosis not present

## 2021-09-12 DIAGNOSIS — Z1331 Encounter for screening for depression: Secondary | ICD-10-CM | POA: Diagnosis not present

## 2021-09-12 DIAGNOSIS — Z6841 Body Mass Index (BMI) 40.0 and over, adult: Secondary | ICD-10-CM

## 2021-09-12 DIAGNOSIS — E559 Vitamin D deficiency, unspecified: Secondary | ICD-10-CM

## 2021-09-12 DIAGNOSIS — E668 Other obesity: Secondary | ICD-10-CM | POA: Diagnosis not present

## 2021-09-12 DIAGNOSIS — K219 Gastro-esophageal reflux disease without esophagitis: Secondary | ICD-10-CM | POA: Diagnosis not present

## 2021-09-12 DIAGNOSIS — R0602 Shortness of breath: Secondary | ICD-10-CM

## 2021-09-12 DIAGNOSIS — I10 Essential (primary) hypertension: Secondary | ICD-10-CM | POA: Diagnosis not present

## 2021-09-12 DIAGNOSIS — R7303 Prediabetes: Secondary | ICD-10-CM

## 2021-09-12 NOTE — Progress Notes (Signed)
Chief Complaint:   [place] (MR# [id number]) is a 36 y.o. female who presents for evaluation and treatment of obesity and related comorbidities. Current BMI is Body mass index is 44.99 kg/m. Marcea has been struggling with her weight for many years and has been unsuccessful in either losing weight, maintaining weight loss, or reaching her healthy weight goal.  Bunice does like to cook. She craves sweets and breads.   Naiah is currently in the action stage of change and ready to dedicate time achieving and maintaining a healthier weight. Idman is interested in becoming our patient and working on intensive lifestyle modifications including (but not limited to) diet and exercise for weight loss.  Lannah's habits were reviewed today and are as follows: Her family eats meals together, she thinks her family will eat healthier with her, her desired weight loss is 65 pounds, she has been heavy most of her life, she started gaining weight after high school, her heaviest weight ever was 252 pounds, she is a picky eater and doesn't like to eat healthier foods, she has significant food cravings issues, she snacks frequently in the evenings, she skips meals frequently, she is frequently drinking liquids with calories, she frequently makes poor food choices, she frequently eats larger portions than normal, she has binge eating behaviors, and she struggles with emotional eating.  Depression Screen Cenia's Food and Mood (modified PHQ-9) score was 5.  Depression screen [facility] 2/9 09/12/2021  Decreased Interest 0  Down, Depressed, Hopeless 1  PHQ - 2 Score 1  Altered sleeping 0  Tired, decreased energy 1  Change in appetite 1  Feeling bad or failure about yourself  1  Trouble concentrating 1  Moving slowly or fidgety/restless 0  Suicidal thoughts 0  PHQ-9 Score 5  Difficult doing work/chores Not difficult at all   Subjective:   1. Other fatigue Arelia will continue  activities. Arthur reports daytime somnolence and reports waking up still tired. Patient has a history of symptoms of morning fatigue. Benita generally gets 6 hours of sleep per night, and states that she has difficulty falling asleep and generally restful sleep. Snoring is present. Apneic episodes are not present. Epworth Sleepiness Score is 5.    2. SOB (shortness of breath) on exertion Mialani will continue activities. Bristal notes increasing shortness of breath with exercising and seems to be worsening over time with weight gain. She notes getting out of breath sooner with activity than she used to. This has not gotten worse recently. Khristi denies shortness of breath at rest or orthopnea.   3. Essential hypertension Ellen is currently taking HCTZ and losartan.   4. Pre-diabetes Nyjai is not on medications currently. Her last A1C was 6.1.  5. Gastroesophageal reflux disease, unspecified whether esophagitis present Laquenta is taking Omeprazole currently. Her GERD is controlled if does not eat trigger foods.   6. Vitamin D deficiency Akiah is not on Vitamin D currently.   Assessment/Plan:   1. Other fatigue Omar will gradually increase activities and exercise. Milessa does feel that her weight is causing her energy to be lower than it should be. Fatigue may be related to obesity, depression or many other causes. Labs will be ordered, and in the meanwhile, Deani will focus on self care including making healthy food choices, increasing physical activity and focusing on stress reduction.  - EKG 12-Lead - TSH+T4F+T3Free - Comprehensive metabolic panel  2. SOB (shortness of breath) on exertion Euretha will gradually increase activities and  exercise. Clestine does feel that she gets out of breath more easily that she used to when she exercises. Yanitza's shortness of breath appears to be obesity related and exercise induced. She has agreed to work on weight loss and gradually  increase exercise to treat her exercise induced shortness of breath. Will continue to monitor closely.  - TSH+T4F+T3Free - Comprehensive metabolic panel  3. Essential hypertension We will check CMP today. Kieanna is working on healthy weight loss and exercise to improve blood pressure control. We will watch for signs of hypotension as she continues her lifestyle modifications.  4. Pre-diabetes We will check A1C and insulin today. Yudelka will continue to work on weight loss, exercise, and decreasing simple carbohydrates to help decrease the risk of diabetes.   - Insulin, random - Hemoglobin A1c - Comprehensive metabolic panel  5. Gastroesophageal reflux disease, unspecified whether esophagitis present Intensive lifestyle modifications are the first line treatment for this issue. Myan will continue taking Omeprazole. She will avoid her triggers. We discussed several lifestyle modifications today and she will continue to work on diet, exercise and weight loss efforts. Orders and follow up as documented in patient record.   Counseling If a person has gastroesophageal reflux disease (GERD), food and stomach acid move back up into the esophagus and cause symptoms or problems such as damage to the esophagus. Anti-reflux measures include: raising the head of the bed, avoiding tight clothing or belts, avoiding eating late at night, not lying down shortly after mealtime, and achieving weight loss. Avoid ASA, NSAID's, caffeine, alcohol, and tobacco.  OTC Pepcid and/or Tums are often very helpful for as needed use.  However, for persisting chronic or daily symptoms, stronger medications like Omeprazole may be needed. You may need to avoid foods and drinks such as: Coffee and tea (with or without caffeine). Drinks that contain alcohol. Energy drinks and sports drinks. Bubbly (carbonated) drinks or sodas. Chocolate and cocoa. Peppermint and mint flavorings. Garlic and onions. Horseradish. Spicy  and acidic foods. These include peppers, chili powder, curry powder, vinegar, hot sauces, and BBQ sauce. Citrus fruit juices and citrus fruits, such as oranges, lemons, and limes. Tomato-based foods. These include red sauce, chili, salsa, and pizza with red sauce. Fried and fatty foods. These include donuts, french fries, potato chips, and high-fat dressings. High-fat meats. These include hot dogs, rib eye steak, sausage, ham, and bacon.   6. Vitamin D deficiency Low Vitamin D level contributes to fatigue and are associated with obesity, breast, and colon cancer. We will check Vitamin D and Michiah will follow-up for routine testing of Vitamin D, at least 2-3 times per year to avoid over-replacement.  - VITAMIN D 25 Hydroxy (Vit-D Deficiency, Fractures)  7. Depression screen Elanora had a positive depression screening. Depression is commonly associated with obesity and often results in emotional eating behaviors. We will monitor this closely and work on CBT to help improve the non-hunger eating patterns. Referral to Psychology may be required if no improvement is seen as she continues in our clinic.   8. Class 3 severe obesity with serious comorbidity and body mass index (BMI) of 40.0 to 44.9 in adult, unspecified obesity type (HCC) Keyra is currently in the action stage of change and her goal is to continue with weight loss efforts. I recommend Leilanie begin the structured treatment plan as follows:  She has agreed to the Category 2 Plan.  Merly will continue meal planning and she will be mindful eating. We reviewed labs from 05/09/2021 CMP,  Lipids, CBC, glucose and A1C. She will not skip meals.   Exercise goals: No exercise has been prescribed at this time.   Behavioral modification strategies: increasing lean protein intake, decreasing simple carbohydrates, increasing vegetables, increasing water intake, decreasing eating out, no skipping meals, meal planning and cooking strategies,  keeping healthy foods in the home, and planning for success.  She was informed of the importance of frequent follow-up visits to maximize her success with intensive lifestyle modifications for her multiple health conditions. She was informed we would discuss her lab results at her next visit unless there is a critical issue that needs to be addressed sooner. Pearson agreed to keep her next visit at the agreed upon time to discuss these results.  Objective:   Blood pressure 107/63, pulse 72, temperature 98 F (36.7 C), height 5\' 2"  (1.575 m), last menstrual period 08/24/2021, SpO2 100 %. Body mass index is 44.99 kg/m.  EKG: Normal sinus rhythm, rate 68 bpm.  Indirect Calorimeter completed today shows a VO2 of 250 and a REE of 1728.  Her calculated basal metabolic rate is 0000000 thus her basal metabolic rate is worse than expected.  General: Cooperative, alert, well developed, in no acute distress. HEENT: Conjunctivae and lids unremarkable. Cardiovascular: Regular rhythm.  Lungs: Normal work of breathing. Neurologic: No focal deficits.   Lab Results  Component Value Date   CREATININE 0.96 05/09/2021   BUN 16 05/09/2021   NA 139 05/09/2021   K 3.8 05/09/2021   CL 104 05/09/2021   CO2 30 05/09/2021   Lab Results  Component Value Date   ALT 17 05/09/2021   AST 16 05/09/2021   ALKPHOS 55 05/09/2021   BILITOT 0.7 05/09/2021   Lab Results  Component Value Date   HGBA1C 6.1 05/09/2021   HGBA1C 6.1 11/08/2020   HGBA1C 6.1 05/01/2020   HGBA1C 5.9 04/06/2019   HGBA1C 5.7 03/23/2018   No results found for: INSULIN Lab Results  Component Value Date   TSH 1.78 01/15/2016   Lab Results  Component Value Date   CHOL 156 05/09/2021   HDL 45.10 05/09/2021   LDLCALC 96 05/09/2021   TRIG 72.0 05/09/2021   CHOLHDL 3 05/09/2021   Lab Results  Component Value Date   WBC 8.6 05/09/2021   HGB 12.8 05/09/2021   HCT 39.0 05/09/2021   MCV 88.7 05/09/2021   PLT 262.0 05/09/2021   No  results found for: IRON, TIBC, FERRITIN  Attestation Statements:   Reviewed by clinician on day of visit: allergies, medications, problem list, medical history, surgical history, family history, social history, and previous encounter notes.  I, Lizbeth Bark, RMA, am acting as Location manager for [employer], DO.  I have reviewed the above documentation for accuracy and completeness, and I agree with the above. Jearld Lesch, DO

## 2021-09-13 ENCOUNTER — Encounter (INDEPENDENT_AMBULATORY_CARE_PROVIDER_SITE_OTHER): Payer: Self-pay | Admitting: Bariatrics

## 2021-09-13 DIAGNOSIS — E559 Vitamin D deficiency, unspecified: Secondary | ICD-10-CM | POA: Insufficient documentation

## 2021-09-13 LAB — COMPREHENSIVE METABOLIC PANEL
ALT: 13 IU/L (ref 0–32)
AST: 16 IU/L (ref 0–40)
Albumin/Globulin Ratio: 1.4 (ref 1.2–2.2)
Albumin: 4.1 g/dL (ref 3.8–4.8)
Alkaline Phosphatase: 69 IU/L (ref 44–121)
BUN/Creatinine Ratio: 13 (ref 9–23)
BUN: 13 mg/dL (ref 6–20)
Bilirubin Total: 0.6 mg/dL (ref 0.0–1.2)
CO2: 24 mmol/L (ref 20–29)
Calcium: 9.2 mg/dL (ref 8.7–10.2)
Chloride: 100 mmol/L (ref 96–106)
Creatinine, Ser: 0.97 mg/dL (ref 0.57–1.00)
Globulin, Total: 2.9 g/dL (ref 1.5–4.5)
Glucose: 100 mg/dL — ABNORMAL HIGH (ref 70–99)
Potassium: 4.5 mmol/L (ref 3.5–5.2)
Sodium: 137 mmol/L (ref 134–144)
Total Protein: 7 g/dL (ref 6.0–8.5)
eGFR: 77 mL/min/{1.73_m2} (ref >59)

## 2021-09-13 LAB — INSULIN, RANDOM: INSULIN: 29.5 u[IU]/mL — ABNORMAL HIGH (ref 2.6–24.9)

## 2021-09-13 LAB — HEMOGLOBIN A1C
Est. average glucose Bld gHb Est-mCnc: 128 mg/dL
Hgb A1c MFr Bld: 6.1 % — ABNORMAL HIGH (ref 4.8–5.6)

## 2021-09-13 LAB — TSH+T4F+T3FREE
Free T4: 1.05 ng/dL (ref 0.82–1.77)
T3, Free: 2.7 pg/mL (ref 2.0–4.4)
TSH: 1.14 u[IU]/mL (ref 0.450–4.500)

## 2021-09-13 LAB — VITAMIN D 25 HYDROXY (VIT D DEFICIENCY, FRACTURES): Vit D, 25-Hydroxy: 10.2 ng/mL — ABNORMAL LOW (ref 30.0–100.0)

## 2021-09-16 ENCOUNTER — Encounter (INDEPENDENT_AMBULATORY_CARE_PROVIDER_SITE_OTHER): Payer: Self-pay | Admitting: Bariatrics

## 2021-09-26 ENCOUNTER — Encounter (INDEPENDENT_AMBULATORY_CARE_PROVIDER_SITE_OTHER): Payer: Self-pay | Admitting: Bariatrics

## 2021-09-26 ENCOUNTER — Other Ambulatory Visit (HOSPITAL_COMMUNITY): Payer: Self-pay

## 2021-09-26 ENCOUNTER — Ambulatory Visit (INDEPENDENT_AMBULATORY_CARE_PROVIDER_SITE_OTHER): Payer: 59 | Admitting: Bariatrics

## 2021-09-26 ENCOUNTER — Other Ambulatory Visit: Payer: Self-pay

## 2021-09-26 VITALS — BP 109/76 | HR 72 | Temp 98.2°F | Ht 62.0 in | Wt 238.0 lb

## 2021-09-26 DIAGNOSIS — Z6841 Body Mass Index (BMI) 40.0 and over, adult: Secondary | ICD-10-CM | POA: Diagnosis not present

## 2021-09-26 DIAGNOSIS — E559 Vitamin D deficiency, unspecified: Secondary | ICD-10-CM | POA: Diagnosis not present

## 2021-09-26 DIAGNOSIS — E669 Obesity, unspecified: Secondary | ICD-10-CM | POA: Diagnosis not present

## 2021-09-26 DIAGNOSIS — R7303 Prediabetes: Secondary | ICD-10-CM

## 2021-09-26 MED ORDER — VITAMIN D (ERGOCALCIFEROL) 1.25 MG (50000 UNIT) PO CAPS
50000.0000 [IU] | ORAL_CAPSULE | ORAL | 0 refills | Status: DC
  Filled 2021-09-26 (×2): qty 4, 28d supply, fill #0

## 2021-09-27 ENCOUNTER — Encounter (INDEPENDENT_AMBULATORY_CARE_PROVIDER_SITE_OTHER): Payer: Self-pay | Admitting: Bariatrics

## 2021-09-27 NOTE — Progress Notes (Signed)
? ? ? ?Chief Complaint:  ? ?OBESITY ?Linda Mckinney is here to discuss her progress with her obesity treatment plan along with follow-up of her obesity related diagnoses. Linda Mckinney is on the Category 2 Plan and states she is following her eating plan approximately 95% of the time. Linda Mckinney states she is doing 0 minutes 0 times per week. ? ?Today's visit was #: 2 ?Starting weight: 245 lbs ?Starting date: 09/12/2021 ?Today's weight: 238 lbs ?Today's date: 09/26/2021 ?Total lbs lost to date: 7 lbs ?Total lbs lost since last in-office visit: 7 lbs ? ?Interim History: Linda Mckinney is down 7 lbs since her initial visit. She states that the plan was hard.  ? ?Subjective:  ? ?1. Pre-diabetes ?Linda Mckinney is not on medications currently. Her last A1C was 6.1. ? ?2. Vitamin D deficiency ?Linda Mckinney is not on medications currently. Her last Vitamin D was 10.2. ? ?Assessment/Plan:  ? ?1. Pre-diabetes ?Linda Mckinney was provided with a handout on pre-diabetes and insulin resistance today. She will continue to work on weight loss, exercise, and decreasing simple carbohydrates to help decrease the risk of diabetes.  ? ?2. Vitamin D deficiency ?Low Vitamin D level contributes to fatigue and are associated with obesity, breast, and colon cancer. We will refill prescription Vitamin D 50,000 IU every week for 1 month with no refills and Linda Mckinney will follow-up for routine testing of Vitamin D, at least 2-3 times per year to avoid over-replacement. ? ?- Vitamin D, Ergocalciferol, (DRISDOL) 1.25 MG (50000 UNIT) CAPS capsule; Take 1 capsule (50,000 Units total) by mouth every 7 (seven) days.  Dispense: 4 capsule; Refill: 0 ? ?3. Obesity, current BMI 43.5 ?Linda Mckinney is currently in the action stage of change. As such, her goal is to continue with weight loss efforts. She has agreed to the Category 2 Plan.  ? ?Linda Mckinney will continue meal planning and she will continue intentional eating. We reviewed labs from 09/12/2021 CMP, Vitamin D, A1C, insulin, and thyroid panel.  Wm will take healthy snacks to work.  ? ?Exercise goals: No exercise has been prescribed at this time. ? ?Behavioral modification strategies: increasing lean protein intake, decreasing simple carbohydrates, increasing vegetables, increasing water intake, decreasing eating out, no skipping meals, meal planning and cooking strategies, keeping healthy foods in the home, and planning for success. ? ?Matison has agreed to follow-up with our clinic in 2 weeks with Linda Pacific, Mckinney or Linda Mckinney. She was informed of the importance of frequent follow-up visits to maximize her success with intensive lifestyle modifications for her multiple health conditions.  ? ?Objective:  ? ?Blood pressure 109/76, pulse 72, temperature 98.2 ?F (36.8 ?C), height 5\' 2"  (1.575 m), SpO2 100 %. ?Body mass index is 44.81 kg/m?. ? ?General: Cooperative, alert, well developed, in no acute distress. ?HEENT: Conjunctivae and lids unremarkable. ?Cardiovascular: Regular rhythm.  ?Lungs: Normal work of breathing. ?Neurologic: No focal deficits.  ? ?Lab Results  ?Component Value Date  ? CREATININE 0.97 09/12/2021  ? BUN 13 09/12/2021  ? NA 137 09/12/2021  ? K 4.5 09/12/2021  ? CL 100 09/12/2021  ? CO2 24 09/12/2021  ? ?Lab Results  ?Component Value Date  ? ALT 13 09/12/2021  ? AST 16 09/12/2021  ? ALKPHOS 69 09/12/2021  ? BILITOT 0.6 09/12/2021  ? ?Lab Results  ?Component Value Date  ? HGBA1C 6.1 (H) 09/12/2021  ? HGBA1C 6.1 05/09/2021  ? HGBA1C 6.1 11/08/2020  ? HGBA1C 6.1 05/01/2020  ? HGBA1C 5.9 04/06/2019  ? ?Lab Results  ?Component Value Date  ?  INSULIN 29.5 (H) 09/12/2021  ? ?Lab Results  ?Component Value Date  ? TSH 1.140 09/12/2021  ? ?Lab Results  ?Component Value Date  ? CHOL 156 05/09/2021  ? HDL 45.10 05/09/2021  ? [place] 96 05/09/2021  ? TRIG 72.0 05/09/2021  ? CHOLHDL 3 05/09/2021  ? ?Lab Results  ?Component Value Date  ? VD25OH 10.2 (L) 09/12/2021  ? ?Lab Results  ?Component Value Date  ? WBC 8.6 05/09/2021  ? HGB  12.8 05/09/2021  ? HCT 39.0 05/09/2021  ? MCV 88.7 05/09/2021  ? PLT 262.0 05/09/2021  ? ?No results found for: IRON, TIBC, FERRITIN ? ?Attestation Statements:  ? ?Reviewed by clinician on day of visit: allergies, medications, problem list, medical history, surgical history, family history, social history, and previous encounter notes. ? ?I, Lizbeth Bark, RMA, am acting as transcriptionist for [employer], DO. ? ?I have reviewed the above documentation for accuracy and completeness, and I agree with the above. Jearld Lesch, DO ? ?

## 2021-10-15 ENCOUNTER — Ambulatory Visit (INDEPENDENT_AMBULATORY_CARE_PROVIDER_SITE_OTHER): Payer: 59 | Admitting: Nurse Practitioner

## 2021-10-15 ENCOUNTER — Encounter (INDEPENDENT_AMBULATORY_CARE_PROVIDER_SITE_OTHER): Payer: Self-pay | Admitting: Nurse Practitioner

## 2021-10-15 ENCOUNTER — Other Ambulatory Visit (HOSPITAL_COMMUNITY): Payer: Self-pay

## 2021-10-15 VITALS — BP 115/69 | HR 68 | Temp 97.9°F | Ht 62.0 in | Wt 233.0 lb

## 2021-10-15 DIAGNOSIS — Z6841 Body Mass Index (BMI) 40.0 and over, adult: Secondary | ICD-10-CM | POA: Diagnosis not present

## 2021-10-15 DIAGNOSIS — E559 Vitamin D deficiency, unspecified: Secondary | ICD-10-CM | POA: Diagnosis not present

## 2021-10-15 DIAGNOSIS — Z9189 Other specified personal risk factors, not elsewhere classified: Secondary | ICD-10-CM | POA: Diagnosis not present

## 2021-10-15 DIAGNOSIS — E669 Obesity, unspecified: Secondary | ICD-10-CM

## 2021-10-15 MED ORDER — VITAMIN D (ERGOCALCIFEROL) 1.25 MG (50000 UNIT) PO CAPS
50000.0000 [IU] | ORAL_CAPSULE | ORAL | 0 refills | Status: DC
  Filled 2021-10-15: qty 4, 28d supply, fill #0

## 2021-10-16 NOTE — Progress Notes (Signed)
? ? ? ?Chief Complaint:  ? ?OBESITY ?Linda Mckinney is here to discuss her progress with her obesity treatment plan along with follow-up of her obesity related diagnoses. Linda Mckinney is on the Category 2 Plan and states she is following her eating plan approximately 85% of the time. Linda Mckinney states she is exercising 0 minutes 0 times per week. ? ?Today's visit was #: 3 ?Starting weight: 245 lbs ?Starting date: 09/12/2021 ?Today's weight: 233 lb ?Today's date: 10/15/2021 ?Total lbs lost to date: 12 ?Total lbs lost since last in-office visit: 5 ? ?Interim History: Linda Mckinney has done well with weight loss. She is working 2 jobs, 5 days per week and is going to school at [employer]. Linda Mckinney explains that she is getting bored with breakfast options. Denies hunger, but struggles with cravings. ? ?Subjective:  ? ?1. Vitamin D deficiency ?Linda Mckinney's last Vit D was at 10.2. She is taking Vit D 50k weekly. Denies any nausea, vomiting or muscle weakness. Linda Mckinney reports fatigue. ? ?2. At risk for osteoporosis ?Linda Mckinney is at higher risk of osteopenia and osteoporosis due to Vitamin D deficiency.  ? ?Assessment/Plan:  ? ?1. Vitamin D deficiency ?We will refill Vit D 50k for 1 month with no refills. ? ?- Refill Vitamin D, Ergocalciferol, (DRISDOL) 1.25 MG (50000 UNIT) CAPS capsule; Take 1 capsule (50,000 Units total) by mouth every 7 (seven) days.  Dispense: 4 capsule; Refill: 0 ? ?2. At risk for osteoporosis ?Linda Mckinney was given approximately 15 minutes of osteoporosis prevention counseling today. Linda Mckinney is at risk for osteopenia and osteoporosis due to her Vitamin D deficiency. She was encouraged to take her Vit D and follow her higher calcium diet and increase strengthening exercise to help strengthen her bones and decrease her risk of osteopenia and osteoporosis. ? ?3. Obesity, current BMI 42.7 ?Linda Mckinney is currently in the action stage of change. As such, her goal is to continue with weight loss efforts. She has agreed to the Category 2 Plan and  keeping a food journal and adhering to recommended goals of 200-300 calories and 20+ grams of  protein for breakfast. ? ?Exercise goals: Start walking.  ? ?Behavioral modification strategies: increasing lean protein intake, increasing water intake, no skipping meals, and meal planning and cooking strategies. ? ?Linda Mckinney has agreed to follow-up with our clinic in 2 weeks. She was informed of the importance of frequent follow-up visits to maximize her success with intensive lifestyle modifications for her multiple health conditions.  ? ?Objective:  ? ?Blood pressure 115/69, pulse 68, temperature 97.9 ?F (36.6 ?C), height 5\' 2"  (1.575 m), weight 233 lb (105.7 kg), SpO2 99 %. ?Body mass index is 42.62 kg/m?. ? ?General: Cooperative, alert, well developed, in no acute distress. ?HEENT: Conjunctivae and lids unremarkable. ?Cardiovascular: Regular rhythm.  ?Lungs: Normal work of breathing. ?Neurologic: No focal deficits.  ? ?Lab Results  ?Component Value Date  ? CREATININE 0.97 09/12/2021  ? BUN 13 09/12/2021  ? NA 137 09/12/2021  ? K 4.5 09/12/2021  ? CL 100 09/12/2021  ? CO2 24 09/12/2021  ? ?Lab Results  ?Component Value Date  ? ALT 13 09/12/2021  ? AST 16 09/12/2021  ? ALKPHOS 69 09/12/2021  ? BILITOT 0.6 09/12/2021  ? ?Lab Results  ?Component Value Date  ? HGBA1C 6.1 (H) 09/12/2021  ? HGBA1C 6.1 05/09/2021  ? HGBA1C 6.1 11/08/2020  ? HGBA1C 6.1 05/01/2020  ? HGBA1C 5.9 04/06/2019  ? ?Lab Results  ?Component Value Date  ? INSULIN 29.5 (H) 09/12/2021  ? ?Lab Results  ?Component  Value Date  ? TSH 1.140 09/12/2021  ? ?Lab Results  ?Component Value Date  ? CHOL 156 05/09/2021  ? HDL 45.10 05/09/2021  ? [place] 96 05/09/2021  ? TRIG 72.0 05/09/2021  ? CHOLHDL 3 05/09/2021  ? ?Lab Results  ?Component Value Date  ? VD25OH 10.2 (L) 09/12/2021  ? ?Lab Results  ?Component Value Date  ? WBC 8.6 05/09/2021  ? HGB 12.8 05/09/2021  ? HCT 39.0 05/09/2021  ? MCV 88.7 05/09/2021  ? PLT 262.0 05/09/2021  ? ?No results found for: IRON,  TIBC, FERRITIN ? ?Attestation Statements:  ? ?Reviewed by clinician on day of visit: allergies, medications, problem list, medical history, surgical history, family history, social history, and previous encounter notes. ? ?I, Brendell Tyus, am acting as transcriptionist for Everardo Pacific, FNP. ? ?I have reviewed the above documentation for accuracy and completeness, and I agree with the above. Everardo Pacific, FNP  ?

## 2021-10-18 ENCOUNTER — Other Ambulatory Visit (HOSPITAL_COMMUNITY): Payer: Self-pay

## 2021-10-30 ENCOUNTER — Other Ambulatory Visit (HOSPITAL_COMMUNITY): Payer: Self-pay

## 2021-10-30 ENCOUNTER — Ambulatory Visit (INDEPENDENT_AMBULATORY_CARE_PROVIDER_SITE_OTHER): Payer: 59 | Admitting: Adult Health

## 2021-10-30 ENCOUNTER — Encounter (INDEPENDENT_AMBULATORY_CARE_PROVIDER_SITE_OTHER): Payer: Self-pay | Admitting: Adult Health

## 2021-10-30 VITALS — BP 96/58 | HR 62 | Temp 98.0°F | Ht 62.0 in | Wt 229.0 lb

## 2021-10-30 DIAGNOSIS — Z6841 Body Mass Index (BMI) 40.0 and over, adult: Secondary | ICD-10-CM

## 2021-10-30 DIAGNOSIS — E669 Obesity, unspecified: Secondary | ICD-10-CM

## 2021-10-30 DIAGNOSIS — E559 Vitamin D deficiency, unspecified: Secondary | ICD-10-CM

## 2021-10-30 DIAGNOSIS — Z9189 Other specified personal risk factors, not elsewhere classified: Secondary | ICD-10-CM

## 2021-10-30 DIAGNOSIS — I10 Essential (primary) hypertension: Secondary | ICD-10-CM | POA: Diagnosis not present

## 2021-10-30 MED ORDER — LOSARTAN POTASSIUM 50 MG PO TABS
50.0000 mg | ORAL_TABLET | Freq: Every day | ORAL | 0 refills | Status: DC
  Filled 2021-10-30 – 2021-11-13 (×2): qty 30, 30d supply, fill #0

## 2021-11-07 ENCOUNTER — Emergency Department (HOSPITAL_BASED_OUTPATIENT_CLINIC_OR_DEPARTMENT_OTHER)
Admission: EM | Admit: 2021-11-07 | Discharge: 2021-11-07 | Disposition: A | Discharge status: Home / Self Care | Payer: 59 | Attending: Emergency Medicine | Admitting: Emergency Medicine

## 2021-11-07 ENCOUNTER — Encounter (HOSPITAL_BASED_OUTPATIENT_CLINIC_OR_DEPARTMENT_OTHER): Payer: Self-pay

## 2021-11-07 ENCOUNTER — Other Ambulatory Visit: Payer: Self-pay

## 2021-11-07 DIAGNOSIS — I10 Essential (primary) hypertension: Secondary | ICD-10-CM | POA: Insufficient documentation

## 2021-11-07 DIAGNOSIS — Z79899 Other long term (current) drug therapy: Secondary | ICD-10-CM | POA: Insufficient documentation

## 2021-11-07 DIAGNOSIS — R5383 Other fatigue: Secondary | ICD-10-CM | POA: Diagnosis not present

## 2021-11-07 DIAGNOSIS — R040 Epistaxis: Secondary | ICD-10-CM | POA: Insufficient documentation

## 2021-11-07 DIAGNOSIS — R42 Dizziness and giddiness: Secondary | ICD-10-CM | POA: Diagnosis not present

## 2021-11-07 LAB — CBC
HCT: 36.2 % (ref 36.0–46.0)
Hemoglobin: 11.7 g/dL — ABNORMAL LOW (ref 12.0–15.0)
MCH: 28.6 pg (ref 26.0–34.0)
MCHC: 32.3 g/dL (ref 30.0–36.0)
MCV: 88.5 fL (ref 80.0–100.0)
Platelets: 272 10*3/uL (ref 150–400)
RBC: 4.09 MIL/uL (ref 3.87–5.11)
RDW: 12.5 % (ref 11.5–15.5)
WBC: 11.2 10*3/uL — ABNORMAL HIGH (ref 4.0–10.5)
nRBC: 0 % (ref 0.0–0.2)

## 2021-11-07 NOTE — Telephone Encounter (Signed)
I spoke with pt; pt said 11/02/21 and then again today pt had nosebleed that seemed to last a long time possibly 15 - 20 mins around 3:30.pm. Pt sat with head between her legs.(Pt was at grocery store).  And bleeding stopped but pt had large blood clots at back of her throat that pt coughed up and another lge blood clot came out of pts nose. Pt did not have H/A or any warning prior to nosebleed. Pt did feel lightheaded after the nosebleed. Pt has not missed taking BP med. Pt took BP when she got home and BP 113/79 P 81. Pt is not having nosebleed now and does not want to go to ED but pt said she would go to [facility] now and get evaluated. Sending note to Dr Einar Pheasant while Gentry Fitz NP is out of office and [facility] CMA.  ?

## 2021-11-07 NOTE — ED Provider Notes (Signed)
?[facility] DEPT ?Provider Note ? ? ?CSN: [id number] ?Arrival date & time: 11/07/21  1741 ? ?  ? ?History ? ?Chief Complaint  ?Patient presents with  ? Epistaxis  ? ? ?Linda Mckinney is a 36 y.o. female with past medical history significant for obesity, high blood pressure, prediabetes who presents with concern for epistaxis, feeling of fatigue, lightheadedness over the last few weeks.  She reports she has not struggled with nosebleeds typically, has not had one since she was 39.  She reports she does typically have heavy menstrual cycles.  She has not had CBC since October, reports that it was overall normal at that time.  She reports that she did have a low vitamin D at her last test and has begun to take supplementation.  She has been seen by a weight loss clinic recently, attempting to work on her obesity.  She reports that her epistaxis today lasted for around 1520 minutes, resolved spontaneously with pressure, and sticking in a head tilted position.  She reports that she did cough up blood clot.  She denies any epistaxis since then.  She denies any history of previous clotting disorder. ? ? ?Epistaxis ? ?  ? ?Home Medications ?Prior to Admission medications   ?Medication Sig Start Date End Date Taking? Authorizing Provider  ?celecoxib (CELEBREX) 100 MG capsule Take 1 capsule (100 mg total) by mouth 2 (two) times daily. 08/31/21   Hyatt, Max T, DPM  ?hydrochlorothiazide (HYDRODIURIL) 12.5 MG tablet Take 1 tablet (12.5 mg total) by mouth daily. For blood pressure. 11/08/20   Pleas Koch, NP  ?losartan (COZAAR) 50 MG tablet Take 1 tablet (50 mg total) by mouth daily. 10/30/21   Danford, Valetta Fuller D, NP  ?omeprazole (PRILOSEC) 20 MG capsule Take 20 mg by mouth daily.    [provider]  ?Vitamin D, Ergocalciferol, (DRISDOL) 1.25 MG (50000 UNIT) CAPS capsule Take 1 capsule (50,000 Units total) by mouth every 7 (seven) days. 10/15/21   Everardo Pacific, [place]  ?   ? ?Allergies     ?Patient has no known allergies.   ? ?Review of Systems   ?Review of Systems  ?HENT:  Positive for nosebleeds.   ?All other systems reviewed and are negative. ? ?Physical Exam ?Updated Vital Signs ?BP (!) 145/78 (BP Location: Right Arm)   Pulse 96   Temp (!) 97.2 ?F (36.2 ?C) (Oral)   Resp 16   Ht 5\' 2"  (1.575 m)   Wt 103.9 kg   SpO2 100%   BMI 41.90 kg/m?  ?Physical Exam ?Vitals and nursing note reviewed.  ?Constitutional:   ?   General: She is not in acute distress. ?   Appearance: Normal appearance.  ?HENT:  ?   Head: Normocephalic and atraumatic.  ?   Nose:  ?   Comments: Some evidence of dried scab in right nare. No active bleeding. Scab is anterior. ?Eyes:  ?   General:     ?   Right eye: No discharge.     ?   Left eye: No discharge.  ?Cardiovascular:  ?   Rate and Rhythm: Normal rate and regular rhythm.  ?Pulmonary:  ?   Effort: Pulmonary effort is normal. No respiratory distress.  ?Musculoskeletal:     ?   General: No deformity.  ?Skin: ?   General: Skin is warm and dry.  ?Neurological:  ?   Mental Status: She is alert and oriented to person, place, and time.  ?Psychiatric:     ?  Mood and Affect: Mood normal.     ?   Behavior: Behavior normal.  ? ? ?ED Results / Procedures / Treatments   ?Labs ?(all labs ordered are listed, but only abnormal results are displayed) ?Labs Reviewed  ?CBC - Abnormal; Notable for the following components:  ?    Result Value  ? WBC 11.2 (*)   ? Hemoglobin 11.7 (*)   ? All other components within normal limits  ? ? ?EKG ?None ? ?Radiology ?No results found. ? ?Procedures ?Procedures  ? ? ?Medications Ordered in ED ?Medications - No data to display ? ?ED Course/ Medical Decision Making/ A&P ?  ?                        ?Medical Decision Making ?Amount and/or Complexity of Data Reviewed ?Labs: ordered. ? ? ?Patient presents with concern for epistaxis twice over the last 2 weeks with episode lasting for around 15 to 20 minutes.  She reports some lightheadedness, occasional  weakness as well.  She has been dealing with weight loss, also endorses heavy menstrual period. My emergent differential diagnosis includes clotting disorder, symptomatic anemia, electrolyte abnormality, versus other.  Patient did recently have metabolic panel which appeared unremarkable on my review of records.  She was shown to have low vitamin D which she began taking supplementation for.  We will recheck a CBC as she has not had 1 since October.  CBC shows mild leukocytosis which as we discussed can be from many different factors, patient may have a small infection right now versus stress versus other.  She has a very mild anemia which I do not think is responsible for her symptoms at this time.  Encouraged iron supplementation, rest, plenty of fluids, close follow-up with PCP.  Patient discharged in stable condition at this time.  She is encouraged to swallow both of her nostrils with Vaseline to prevent future nosebleeds. ?Final Clinical Impression(s) / ED Diagnoses ?Final diagnoses:  ?Epistaxis  ? ? ?Rx / DC Orders ?ED Discharge Orders   ? ? None  ? ?  ? ? ?  ?Anselmo Pickler, PA-C ?11/07/21 1918 ? ?  ?Gareth Morgan, MD ?11/08/21 1055 ? ?

## 2021-11-07 NOTE — Discharge Instructions (Signed)
As we discussed I recommend that you swab the inside of your nostrils with Aquaphor or Vaseline a few times a day the next few days to prevent scab in your nose from reopening and causing another nosebleed.  Recommend that you follow-up with your primary care doctor you have additional concerns.  Please return to the emergency department if you have concerns for worsening bleeding, nosebleed that will not stop.  Was a pleasure taking care of you today. ?

## 2021-11-07 NOTE — ED Triage Notes (Signed)
Patient here POV from Home. ? ?Endorses Epistaxis, One Episode Friday and Once more again today at 1400. Endorses Fatigue more recently associated with Lightheadedness. ? ?Called PCP and referred for Evaluation. No Fevers. No N/V. ? ?NAD Noted during Triage. A&Ox4. GCS 15. Ambulatory. ?

## 2021-11-08 ENCOUNTER — Other Ambulatory Visit (HOSPITAL_COMMUNITY): Payer: Self-pay

## 2021-11-08 NOTE — Telephone Encounter (Signed)
Noted, agree with plan.

## 2021-11-12 NOTE — Progress Notes (Signed)
? ? ? ?Chief Complaint:  ? ?OBESITY ?Linda Mckinney is here to discuss her progress with her obesity treatment plan along with follow-up of her obesity related diagnoses. Priti is on the Category 2 Plan and keeping a food journal and adhering to recommended goals of 200-300 calories and 20+ grams of protein at breakfast daily and states she is following her eating plan approximately 85% of the time. Kohana states she is doing 0 minutes 0 times per week. ? ?Today's visit was #: 4 ?Starting weight: 245 lbs ?Starting date: 09/12/2021 ?Today's weight: 229 lbs ?Today's date: 10/30/2021 ?Total lbs lost to date: 23 ?Total lbs lost since last in-office visit: 4 ? ?Interim History:  ?Micala's breakfast is either a serving of  eggs or cauliflower waffles.  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  ?When stressed, will shop at [employer]. ? ?Subjective:  ? ?1. Vitamin D deficiency ?On 09/12/2021, Vitamin D level was 10.2, well below goal.  ?She is on Ergocalciferol denies nausea, vomiting, or muscle weakness.  ? ?2. Essential hypertension ?Chamia endorses dizziness with position changes.  ?She is on HCTZ 12.5 mg daily, and losartan 100 mg daily-both managed by PCP. ? ?3. At risk for complication associated with hypotension ?The patient is at a higher than average risk of hypotension due to weight loss and on 2 antihypertensives. ? ?Assessment/Plan:  ? ?1. Vitamin D deficiency ?Abigaille will continue Ergocalciferol, with no refill needed.  ? ?2. Essential hypertension ?Harlow agreed to decrease losartan 50 mg daily BID, and we will refill for 1 month. She will continue HCTZ 12.5 mg daily. ?Monitor ambulatory BP, bring log to F/U. ? ?- losartan (COZAAR) 50 MG tablet; Take 1 tablet (50 mg total) by mouth daily.  Dispense: 30 tablet; Refill: 0 ? ?3. At risk for complication associated with hypotension ?Mikiala was given approximately 15 minutes of counseling today regarding prevention of  hypoglycemia. She was advised of symptoms of hypoglycemia. Joella was instructed to avoid skipping meals, eat regular protein rich meals, and schedule low calorie snacks as needed. ? ?Repetitive spaced learning was employed today to elicit superior memory formation and behavioral change. ? ?4. Obesity, current BMI 41.9 ?Rhianne is currently in the action stage of change. As such, her goal is to continue with weight loss efforts. She has agreed to the Category 2 Plan.  ? ?Exercise goals: NEAT activities. ? ?Behavioral modification strategies: increasing lean protein intake, decreasing simple carbohydrates, meal planning and cooking strategies, keeping healthy foods in the home, celebration eating strategies, and planning for success. ? ?Tyquasia has agreed to follow-up with our clinic in 3 to 4 weeks. She was informed of the importance of frequent follow-up visits to maximize her success with intensive lifestyle modifications for her multiple health conditions.  ? ?Objective:  ? ?Blood pressure (!) 96/58, pulse 62, temperature 98 ?F (36.7 ?C), height 5\' 2"  (1.575 m), weight 229 lb (103.9 kg), SpO2 100 %. ?Body mass index is 41.88 kg/m?. ? ?General: Cooperative, alert, well developed, in no acute distress. ?HEENT: Conjunctivae and lids unremarkable. ?Cardiovascular: Regular rhythm.  ?Lungs: Normal work of breathing. ?Neurologic: No focal deficits.  ? ?Lab Results  ?Component Value Date  ? CREATININE 0.97 09/12/2021  ? BUN 13 09/12/2021  ? NA 137 09/12/2021  ? K 4.5 09/12/2021  ? CL 100 09/12/2021  ? CO2 24 09/12/2021  ? ?Lab Results  ?Component Value Date  ? ALT 13 09/12/2021  ? AST 16 09/12/2021  ?  ALKPHOS 69 09/12/2021  ? BILITOT 0.6 09/12/2021  ? ?Lab Results  ?Component Value Date  ? HGBA1C 6.1 (H) 09/12/2021  ? HGBA1C 6.1 05/09/2021  ? HGBA1C 6.1 11/08/2020  ? HGBA1C 6.1 05/01/2020  ? HGBA1C 5.9 04/06/2019  ? ?Lab Results  ?Component Value Date  ? INSULIN 29.5 (H) 09/12/2021  ? ?Lab Results  ?Component Value Date   ? TSH 1.140 09/12/2021  ? ?Lab Results  ?Component Value Date  ? CHOL 156 05/09/2021  ? HDL 45.10 05/09/2021  ? [place] 96 05/09/2021  ? TRIG 72.0 05/09/2021  ? CHOLHDL 3 05/09/2021  ? ?Lab Results  ?Component Value Date  ? VD25OH 10.2 (L) 09/12/2021  ? ?Lab Results  ?Component Value Date  ? WBC 11.2 (H) 11/07/2021  ? HGB 11.7 (L) 11/07/2021  ? HCT 36.2 11/07/2021  ? MCV 88.5 11/07/2021  ? PLT 272 11/07/2021  ? ?No results found for: IRON, TIBC, FERRITIN ? ?Attestation Statements:  ? ?Reviewed by clinician on day of visit: allergies, medications, problem list, medical history, surgical history, family history, social history, and previous encounter notes. ? ? ?I, Trixie Dredge, am acting as transcriptionist for Mina Marble, NP. ? ?I have reviewed the above documentation for accuracy and completeness, and I agree with the above. -  Racquelle Hyser d. Sahan Pen, NP-C ? ?

## 2021-11-13 ENCOUNTER — Other Ambulatory Visit (HOSPITAL_COMMUNITY): Payer: Self-pay

## 2021-11-14 ENCOUNTER — Other Ambulatory Visit (HOSPITAL_COMMUNITY): Payer: Self-pay

## 2021-11-27 ENCOUNTER — Other Ambulatory Visit (HOSPITAL_COMMUNITY): Payer: Self-pay

## 2021-11-27 ENCOUNTER — Encounter (INDEPENDENT_AMBULATORY_CARE_PROVIDER_SITE_OTHER): Payer: Self-pay | Admitting: Adult Health

## 2021-11-27 ENCOUNTER — Ambulatory Visit (INDEPENDENT_AMBULATORY_CARE_PROVIDER_SITE_OTHER): Payer: 59 | Admitting: Adult Health

## 2021-11-27 VITALS — BP 118/79 | HR 75 | Temp 98.0°F | Ht 62.0 in | Wt 227.0 lb

## 2021-11-27 DIAGNOSIS — E669 Obesity, unspecified: Secondary | ICD-10-CM

## 2021-11-27 DIAGNOSIS — E559 Vitamin D deficiency, unspecified: Secondary | ICD-10-CM

## 2021-11-27 DIAGNOSIS — Z6841 Body Mass Index (BMI) 40.0 and over, adult: Secondary | ICD-10-CM | POA: Diagnosis not present

## 2021-11-27 DIAGNOSIS — Z9189 Other specified personal risk factors, not elsewhere classified: Secondary | ICD-10-CM

## 2021-11-27 DIAGNOSIS — I10 Essential (primary) hypertension: Secondary | ICD-10-CM

## 2021-11-27 MED ORDER — VITAMIN D (ERGOCALCIFEROL) 1.25 MG (50000 UNIT) PO CAPS
50000.0000 [IU] | ORAL_CAPSULE | ORAL | 0 refills | Status: DC
  Filled 2021-11-27: qty 8, 24d supply, fill #0

## 2021-12-05 NOTE — Progress Notes (Unsigned)
Chief Complaint:   OBESITY Linda Mckinney is here to discuss her progress with her obesity treatment plan along with follow-up of her obesity related diagnoses. Linda Mckinney is on the Category 2 Plan and states she is following her eating plan approximately 65% of the time. Linda Mckinney states she is exercising 0 minutes 0 times per week.  Today's visit was #: 5 Starting weight: 245 lbs Starting date: 09/12/2021 Today's weight: 227 lbs Today's date: 11/27/2021 Total lbs lost to date: 18 Total lbs lost since last in-office visit: 2  Interim History: Linda Mckinney had a recent ED visit for Epitavis on 11/07/21- reviewed visit with her. She has yet to follow up with PCP. She denies any Epitavis since ED visit.  Subjective:   1. Vitamin D deficiency On 09/12/21 Vit D level of 10.2- severely low. Currently taking once weekly ergocalciferol. Notes fatigue.  2. Essential hypertension Linda Mckinney's home readings, systolic [id number], diastolic 0000000. She is currently taking HCTZ 12.5 mg daily, Losartan 50 mg daily - decrease from 100 mg daily at last visit.  3. At risk for osteoporosis Linda Mckinney is at higher risk of osteopenia and osteoporosis due to Vitamin D deficiency and obesity.   Assessment/Plan:   1. Vitamin D deficiency We will refill ergocalciferol 50,000 IU twice a week for 1 month with no refills.  -Refill Vitamin D, Ergocalciferol, (DRISDOL) 1.25 MG (50000 UNIT) CAPS capsule; Take 1 capsule by mouth every 3 days.  Dispense: 8 capsule; Refill: 0  2. Essential hypertension Linda Mckinney will continue ***  3. At risk for osteoporosis Linda Mckinney was given approximately 15 minutes of osteoporosis prevention counseling today. Linda Mckinney is at risk for osteopenia and osteoporosis due to her Vitamin D deficiency. She was encouraged to take her Vit D and follow her higher calcium diet and increase strengthening exercise to help strengthen her bones and decrease her risk of osteopenia and osteoporosis.  4. Obesity,  current BMI [id number] Linda Mckinney is currently in the action stage of change. As such, her goal is to continue with weight loss efforts. She has agreed to the Category 2 Plan.   Exercise goals: No exercise has been prescribed at this time.  Behavioral modification strategies: increasing lean protein intake, decreasing simple carbohydrates, meal planning and cooking strategies, keeping healthy foods in the home, and planning for success.  Linda Mckinney has agreed to follow-up with our clinic in 3 weeks. She was informed of the importance of frequent follow-up visits to maximize her success with intensive lifestyle modifications for her multiple health conditions.   Objective:   Blood pressure 118/79, pulse 75, temperature 98 F (36.7 C), height 5\' 2"  (1.575 m), weight 227 lb (103 kg), SpO2 100 %. Body mass index is 41.52 kg/m.  General: Cooperative, alert, well developed, in no acute distress. HEENT: Conjunctivae and lids unremarkable. Cardiovascular: Regular rhythm.  Lungs: Normal work of breathing. Neurologic: No focal deficits.   Lab Results  Component Value Date   CREATININE 0.97 09/12/2021   BUN 13 09/12/2021   NA 137 09/12/2021   K 4.5 09/12/2021   CL 100 09/12/2021   CO2 24 09/12/2021   Lab Results  Component Value Date   ALT 13 09/12/2021   AST 16 09/12/2021   ALKPHOS 69 09/12/2021   BILITOT 0.6 09/12/2021   Lab Results  Component Value Date   HGBA1C 6.1 (H) 09/12/2021   HGBA1C 6.1 05/09/2021   HGBA1C 6.1 11/08/2020   HGBA1C 6.1 05/01/2020   HGBA1C 5.9 04/06/2019   Lab Results  Component Value  Date   INSULIN 29.5 (H) 09/12/2021   Lab Results  Component Value Date   TSH 1.140 09/12/2021   Lab Results  Component Value Date   CHOL 156 05/09/2021   HDL 45.10 05/09/2021   LDLCALC 96 05/09/2021   TRIG 72.0 05/09/2021   CHOLHDL 3 05/09/2021   Lab Results  Component Value Date   VD25OH 10.2 (L) 09/12/2021   Lab Results  Component Value Date   WBC 11.2 (H)  11/07/2021   HGB 11.7 (L) 11/07/2021   HCT 36.2 11/07/2021   MCV 88.5 11/07/2021   PLT 272 11/07/2021   No results found for: IRON, TIBC, FERRITIN  Attestation Statements:   Reviewed by clinician on day of visit: allergies, medications, problem list, medical history, surgical history, family history, social history, and previous encounter notes.  I, Mattson Dayal, RMA, am acting as transcriptionist for Mina Marble, NP.  I have reviewed the above documentation for accuracy and completeness, and I agree with the above. -  ***

## 2021-12-17 ENCOUNTER — Ambulatory Visit (INDEPENDENT_AMBULATORY_CARE_PROVIDER_SITE_OTHER): Payer: 59 | Admitting: Nurse Practitioner

## 2021-12-17 ENCOUNTER — Encounter (INDEPENDENT_AMBULATORY_CARE_PROVIDER_SITE_OTHER): Payer: Self-pay | Admitting: Nurse Practitioner

## 2021-12-17 ENCOUNTER — Other Ambulatory Visit (HOSPITAL_COMMUNITY): Payer: Self-pay

## 2021-12-17 VITALS — BP 110/73 | HR 78 | Temp 98.3°F | Ht 62.0 in | Wt 226.0 lb

## 2021-12-17 DIAGNOSIS — E559 Vitamin D deficiency, unspecified: Secondary | ICD-10-CM | POA: Diagnosis not present

## 2021-12-17 DIAGNOSIS — E669 Obesity, unspecified: Secondary | ICD-10-CM | POA: Diagnosis not present

## 2021-12-17 DIAGNOSIS — R638 Other symptoms and signs concerning food and fluid intake: Secondary | ICD-10-CM

## 2021-12-17 DIAGNOSIS — R7303 Prediabetes: Secondary | ICD-10-CM

## 2021-12-17 DIAGNOSIS — I10 Essential (primary) hypertension: Secondary | ICD-10-CM

## 2021-12-17 DIAGNOSIS — Z6841 Body Mass Index (BMI) 40.0 and over, adult: Secondary | ICD-10-CM

## 2021-12-17 MED ORDER — LOSARTAN POTASSIUM 50 MG PO TABS
50.0000 mg | ORAL_TABLET | Freq: Every day | ORAL | 0 refills | Status: DC
  Filled 2021-12-17: qty 30, 30d supply, fill #0

## 2021-12-17 MED ORDER — BUPROPION HCL ER (SR) 150 MG PO TB12
150.0000 mg | ORAL_TABLET | Freq: Every day | ORAL | 0 refills | Status: DC
  Filled 2021-12-17: qty 30, 30d supply, fill #0

## 2021-12-17 MED ORDER — VITAMIN D (ERGOCALCIFEROL) 1.25 MG (50000 UNIT) PO CAPS
50000.0000 [IU] | ORAL_CAPSULE | ORAL | 0 refills | Status: DC
  Filled 2021-12-17: qty 8, 24d supply, fill #0

## 2021-12-18 LAB — COMPREHENSIVE METABOLIC PANEL
ALT: 15 IU/L (ref 0–32)
AST: 15 IU/L (ref 0–40)
Albumin/Globulin Ratio: 1.4 (ref 1.2–2.2)
Albumin: 4.3 g/dL (ref 3.8–4.8)
Alkaline Phosphatase: 69 IU/L (ref 44–121)
BUN/Creatinine Ratio: 15 (ref 9–23)
BUN: 13 mg/dL (ref 6–20)
Bilirubin Total: 0.7 mg/dL (ref 0.0–1.2)
CO2: 23 mmol/L (ref 20–29)
Calcium: 9.5 mg/dL (ref 8.7–10.2)
Chloride: 101 mmol/L (ref 96–106)
Creatinine, Ser: 0.84 mg/dL (ref 0.57–1.00)
Globulin, Total: 3.1 g/dL (ref 1.5–4.5)
Glucose: 89 mg/dL (ref 70–99)
Potassium: 4.1 mmol/L (ref 3.5–5.2)
Sodium: 138 mmol/L (ref 134–144)
Total Protein: 7.4 g/dL (ref 6.0–8.5)
eGFR: 91 mL/min/{1.73_m2} (ref >59)

## 2021-12-18 LAB — HEMOGLOBIN A1C
Est. average glucose Bld gHb Est-mCnc: 117 mg/dL
Hgb A1c MFr Bld: 5.7 % — ABNORMAL HIGH (ref 4.8–5.6)

## 2021-12-18 LAB — VITAMIN D 25 HYDROXY (VIT D DEFICIENCY, FRACTURES): Vit D, 25-Hydroxy: 35 ng/mL (ref 30.0–100.0)

## 2021-12-18 LAB — INSULIN, RANDOM: INSULIN: 12.2 u[IU]/mL (ref 2.6–24.9)

## 2021-12-18 NOTE — Progress Notes (Unsigned)
Chief Complaint:   OBESITY Linda Mckinney is here to discuss her progress with her obesity treatment plan along with follow-up of her obesity related diagnoses. Linda Mckinney is on the Category 2 Plan and states she is following her eating plan approximately 70% of the time. Linda Mckinney states she is doing 0 minutes 0 times per week.  Today's visit was #: 6 Starting weight: 245 lbs Starting date: 09/12/2021 Today's weight: 226 lbs Today's date: 12/17/2021 Total lbs lost to date: 19 lbs Total lbs lost since last in-office visit: 1 lb  Interim History: Linda Mckinney has overall done well with weight loss. She is struggling with cravings and has been stress eating. Stress includes that she may have to move, based upon where her fiance get relocated for his job. She has started a protein shake with 2 eggs for breakfast.   Subjective:   1. Vitamin D deficiency Linda Mckinney's last Vitamin D was 10.2. She is taking Vitamin D 50,000 IU every 5 days. She denies nausea, vomiting, and muscle weakness.   2. Essential hypertension Linda Mckinney's blood pressure looks good today 110/73. She is taking Cozaar 50 mg and HCTZ 12.5 mg. She denies side effects. Her blood pressure at home are in the ranges of 105-115/70-80.  3. Abnormal craving Linda Mckinney is struggling with cravings and stress eating.   4. Pre-diabetes Linda Mckinney has never been on medications.   Assessment/Plan:   1. Vitamin D deficiency Low Vitamin D level contributes to fatigue and are associated with obesity, breast, and colon cancer. We will refill prescription Vitamin D 50,000 IU every 3 days for 1 month with no refills and Linda Mckinney will follow-up for routine testing of Vitamin D, at least 2-3 times per year to avoid over-replacement. Labs obtained today.   - Vitamin D, Ergocalciferol, (DRISDOL) 1.25 MG (50000 UNIT) CAPS capsule; Take 1 capsule by mouth every 3 days.  Dispense: 8 capsule; Refill: 0  - VITAMIN D 25 Hydroxy (Vit-D Deficiency, Fractures)  2.  Essential hypertension We will refill Cozaar 50 mg for 1 month with no refills. We discussed side effects. Roshena will continue to follow up with her primary care physician. She will continue her medications as directed. Lab obtained today. Persayis is working on healthy weight loss and exercise to improve blood pressure control. We will watch for signs of hypotension as she continues her lifestyle modifications.  - losartan (COZAAR) 50 MG tablet; Take 1 tablet (50 mg total) by mouth daily.  Dispense: 30 tablet; Refill: 0 - Comprehensive metabolic panel  3. Abnormal craving Linda Mckinney agrees to start Wellbutrin SR 150 mg for 1 month with no refills. We discussed side effects.   - buPROPion (WELLBUTRIN SR) 150 MG 12 hr tablet; Take 1 tablet (150 mg total) by mouth daily.  Dispense: 30 tablet; Refill: 0  4. Pre-diabetes Linda Mckinney will continue to work on weight loss, exercise, and decreasing simple carbohydrates to help decrease the risk of diabetes. Labs obtained today.   - Hemoglobin A1c - Insulin, random  5. Obesity, current BMI [id number] Linda Mckinney is currently in the action stage of change. As such, her goal is to continue with weight loss efforts. She has agreed to the Category 2 Plan.   Handouts: Dining out guide was provided today. Labs was obtained today.   Exercise goals: No exercise has been prescribed at this time.  Behavioral modification strategies: increasing lean protein intake, increasing water intake, and no skipping meals.  Linda Mckinney has agreed to follow-up with our clinic in 3 weeks. She  was informed of the importance of frequent follow-up visits to maximize her success with intensive lifestyle modifications for her multiple health conditions.   Objective:   Blood pressure 110/73, pulse 78, temperature 98.3 F (36.8 C), height 5\' 2"  (1.575 m), weight 226 lb (102.5 kg), SpO2 100 %. Body mass index is 41.34 kg/m.  General: Cooperative, alert, well developed, in no acute  distress. HEENT: Conjunctivae and lids unremarkable. Cardiovascular: Regular rhythm.  Lungs: Normal work of breathing. Neurologic: No focal deficits.   Lab Results  Component Value Date   CREATININE 0.84 12/17/2021   BUN 13 12/17/2021   NA 138 12/17/2021   K 4.1 12/17/2021   CL 101 12/17/2021   CO2 23 12/17/2021   Lab Results  Component Value Date   ALT 15 12/17/2021   AST 15 12/17/2021   ALKPHOS 69 12/17/2021   BILITOT 0.7 12/17/2021   Lab Results  Component Value Date   HGBA1C 5.7 (H) 12/17/2021   HGBA1C 6.1 (H) 09/12/2021   HGBA1C 6.1 05/09/2021   HGBA1C 6.1 11/08/2020   HGBA1C 6.1 05/01/2020   Lab Results  Component Value Date   INSULIN 12.2 12/17/2021   INSULIN 29.5 (H) 09/12/2021   Lab Results  Component Value Date   TSH 1.140 09/12/2021   Lab Results  Component Value Date   CHOL 156 05/09/2021   HDL 45.10 05/09/2021   LDLCALC 96 05/09/2021   TRIG 72.0 05/09/2021   CHOLHDL 3 05/09/2021   Lab Results  Component Value Date   VD25OH 35.0 12/17/2021   VD25OH 10.2 (L) 09/12/2021   Lab Results  Component Value Date   WBC 11.2 (H) 11/07/2021   HGB 11.7 (L) 11/07/2021   HCT 36.2 11/07/2021   MCV 88.5 11/07/2021   PLT 272 11/07/2021   No results found for: IRON, TIBC, FERRITIN  Attestation Statements:   Reviewed by clinician on day of visit: allergies, medications, problem list, medical history, surgical history, family history, social history, and previous encounter notes.  I, Lizbeth Bark, RMA, am acting as Location manager for Everardo Pacific, FNP.  I have reviewed the above documentation for accuracy and completeness, and I agree with the above. Everardo Pacific, FNP

## 2021-12-19 ENCOUNTER — Ambulatory Visit (INDEPENDENT_AMBULATORY_CARE_PROVIDER_SITE_OTHER): Payer: 59 | Admitting: Nurse Practitioner

## 2022-01-07 ENCOUNTER — Encounter (INDEPENDENT_AMBULATORY_CARE_PROVIDER_SITE_OTHER): Payer: Self-pay | Admitting: Nurse Practitioner

## 2022-01-07 ENCOUNTER — Other Ambulatory Visit (HOSPITAL_COMMUNITY): Payer: Self-pay

## 2022-01-07 ENCOUNTER — Ambulatory Visit (INDEPENDENT_AMBULATORY_CARE_PROVIDER_SITE_OTHER): Payer: 59 | Admitting: Nurse Practitioner

## 2022-01-07 VITALS — BP 121/79 | HR 72 | Temp 99.0°F | Ht 62.0 in | Wt 224.0 lb

## 2022-01-07 DIAGNOSIS — I10 Essential (primary) hypertension: Secondary | ICD-10-CM

## 2022-01-07 DIAGNOSIS — Z6841 Body Mass Index (BMI) 40.0 and over, adult: Secondary | ICD-10-CM | POA: Diagnosis not present

## 2022-01-07 DIAGNOSIS — E559 Vitamin D deficiency, unspecified: Secondary | ICD-10-CM

## 2022-01-07 DIAGNOSIS — E669 Obesity, unspecified: Secondary | ICD-10-CM | POA: Diagnosis not present

## 2022-01-07 DIAGNOSIS — R7303 Prediabetes: Secondary | ICD-10-CM | POA: Diagnosis not present

## 2022-01-07 DIAGNOSIS — R638 Other symptoms and signs concerning food and fluid intake: Secondary | ICD-10-CM

## 2022-01-07 MED ORDER — BUPROPION HCL ER (SR) 150 MG PO TB12
150.0000 mg | ORAL_TABLET | Freq: Every day | ORAL | 0 refills | Status: DC
  Filled 2022-01-07: qty 30, 30d supply, fill #0

## 2022-01-08 NOTE — Progress Notes (Signed)
Chief Complaint:   OBESITY Linda Mckinney is here to discuss her progress with her obesity treatment plan along with follow-up of her obesity related diagnoses. Linda Mckinney is on the Category 2 Plan and states she is following her eating plan approximately 80% of the time. Linda Mckinney states she is doing 0 minutes 0 times per week.  Today's visit was #: 7 Starting weight: 245 lbs Starting date: 09/12/2021 Today's weight: 224 lbs Today's date: 01/07/2022 Total lbs lost to date: 21 Total lbs lost since last in-office visit: 2  Interim History: Linda Mckinney has done well with weight loss since her last visit. She is not skipping meals. She has been eating around 300 calories and lunch, and she notes more hunger in the afternoon.  She is eating 2-3 snacks daily: Popcorn, nuts 100-calorie packs, and rarely candy.  She is drinking water daily, but not enough.  She is going on a cruise in September.  Subjective:   1. Essential hypertension Linda Mckinney is taking hydrochlorothiazide 12.5 mg.  She stopped Cozaar 50 mg June 2023, due to side effects of headaches.  Her blood pressures at home range between 93-119/68-85, pulse 73-102.  She reports still having headaches since stopping losartan.  I discussed labs with the patient today.  2. Abnormal craving Linda Mckinney is taking Wellbutrin SR 150 mg.  Since starting, she notes it has helped with her mood and cravings.  3. Pre-diabetes Linda Mckinney's last A1c was 5.7 and fasting insulin was 12.2.  She denies hunger or cravings.  I discussed labs with the patient today.  4. Vitamin D deficiency Linda Mckinney's last vitamin D level looked better.  She is taking vitamin D 50,000 units weekly, and she denies side effects.  I discussed labs with the patient today.  Assessment/Plan:   1. Essential hypertension Linda Mckinney will continue hydrochlorothiazide 12.5 mg, and we will monitor her blood pressures at home.  We will review at her next visit.  Needs to follow up with PCP for complaints of  headaches.   2. Abnormal craving We will refill Wellbutrin SR 150 mg daily for 1 month.  Side effects were discussed.  - buPROPion (WELLBUTRIN SR) 150 MG 12 hr tablet; Take 1 tablet (150 mg total) by mouth daily.  Dispense: 30 tablet; Refill: 0  3. Pre-diabetes Linda Mckinney will continue to work on weight loss, exercise, and decreasing simple carbohydrates to help decrease the risk of diabetes.   4. Vitamin D deficiency Linda Mckinney will continue prescription vitamin D 50,000 units weekly.  5. Obesity, current BMI 41.0 Linda Mckinney is currently in the action stage of change. As such, her goal is to continue with weight loss efforts. She has agreed to the Category 2 Plan.   Handouts: 100- 200-calorie snack ideas.  Exercise goals: Walking.  Behavioral modification strategies: increasing lean protein intake, increasing water intake, and planning for success.  Linda Mckinney has agreed to follow-up with our clinic in 3 weeks. She was informed of the importance of frequent follow-up visits to maximize her success with intensive lifestyle modifications for her multiple health conditions.   Objective:   Blood pressure 121/79, pulse 72, temperature 99 F (37.2 C), height 5\' 2"  (1.575 m), weight 224 lb (101.6 kg), SpO2 99 %. Body mass index is 40.97 kg/m.  General: Cooperative, alert, well developed, in no acute distress. HEENT: Conjunctivae and lids unremarkable. Cardiovascular: Regular rhythm.  Lungs: Normal work of breathing. Neurologic: No focal deficits.   Lab Results  Component Value Date   CREATININE 0.84 12/17/2021   BUN  13 12/17/2021   NA 138 12/17/2021   K 4.1 12/17/2021   CL 101 12/17/2021   CO2 23 12/17/2021   Lab Results  Component Value Date   ALT 15 12/17/2021   AST 15 12/17/2021   ALKPHOS 69 12/17/2021   BILITOT 0.7 12/17/2021   Lab Results  Component Value Date   HGBA1C 5.7 (H) 12/17/2021   HGBA1C 6.1 (H) 09/12/2021   HGBA1C 6.1 05/09/2021   HGBA1C 6.1 11/08/2020   HGBA1C  6.1 05/01/2020   Lab Results  Component Value Date   INSULIN 12.2 12/17/2021   INSULIN 29.5 (H) 09/12/2021   Lab Results  Component Value Date   TSH 1.140 09/12/2021   Lab Results  Component Value Date   CHOL 156 05/09/2021   HDL 45.10 05/09/2021   LDLCALC 96 05/09/2021   TRIG 72.0 05/09/2021   CHOLHDL 3 05/09/2021   Lab Results  Component Value Date   VD25OH 35.0 12/17/2021   VD25OH 10.2 (L) 09/12/2021   Lab Results  Component Value Date   WBC 11.2 (H) 11/07/2021   HGB 11.7 (L) 11/07/2021   HCT 36.2 11/07/2021   MCV 88.5 11/07/2021   PLT 272 11/07/2021   No results found for: "IRON", "TIBC", "FERRITIN"  Attestation Statements:   Reviewed by clinician on day of visit: allergies, medications, problem list, medical history, surgical history, family history, social history, and previous encounter notes.   Trude Mcburney, am acting as Energy manager for [employer], FNP-C.  I have reviewed the above documentation for accuracy and completeness, and I agree with the above. Irene Limbo, FNP

## 2022-01-09 ENCOUNTER — Other Ambulatory Visit: Payer: Self-pay | Admitting: Primary Care

## 2022-01-09 ENCOUNTER — Other Ambulatory Visit (HOSPITAL_COMMUNITY): Payer: Self-pay

## 2022-01-09 DIAGNOSIS — I10 Essential (primary) hypertension: Secondary | ICD-10-CM

## 2022-01-09 MED ORDER — HYDROCHLOROTHIAZIDE 12.5 MG PO TABS
12.5000 mg | ORAL_TABLET | Freq: Every day | ORAL | 0 refills | Status: DC
  Filled 2022-01-09: qty 90, 90d supply, fill #0

## 2022-01-10 ENCOUNTER — Other Ambulatory Visit (HOSPITAL_COMMUNITY): Payer: Self-pay

## 2022-01-31 ENCOUNTER — Other Ambulatory Visit (HOSPITAL_COMMUNITY): Payer: Self-pay

## 2022-01-31 ENCOUNTER — Ambulatory Visit (INDEPENDENT_AMBULATORY_CARE_PROVIDER_SITE_OTHER): Payer: 59 | Admitting: Nurse Practitioner

## 2022-01-31 VITALS — BP 124/86 | HR 68 | Temp 98.1°F | Ht 62.0 in | Wt 224.0 lb

## 2022-01-31 DIAGNOSIS — E559 Vitamin D deficiency, unspecified: Secondary | ICD-10-CM | POA: Diagnosis not present

## 2022-01-31 DIAGNOSIS — Z6841 Body Mass Index (BMI) 40.0 and over, adult: Secondary | ICD-10-CM

## 2022-01-31 DIAGNOSIS — K5900 Constipation, unspecified: Secondary | ICD-10-CM | POA: Diagnosis not present

## 2022-01-31 DIAGNOSIS — I10 Essential (primary) hypertension: Secondary | ICD-10-CM | POA: Diagnosis not present

## 2022-01-31 DIAGNOSIS — E669 Obesity, unspecified: Secondary | ICD-10-CM

## 2022-01-31 MED ORDER — VITAMIN D (ERGOCALCIFEROL) 1.25 MG (50000 UNIT) PO CAPS
50000.0000 [IU] | ORAL_CAPSULE | ORAL | 0 refills | Status: DC
  Filled 2022-01-31: qty 5, 35d supply, fill #0

## 2022-02-04 NOTE — Progress Notes (Signed)
Chief Complaint:   OBESITY Linda Mckinney is here to discuss her progress with her obesity treatment plan along with follow-up of her obesity related diagnoses. Linda Mckinney is on the Category 2 Plan and states she is following her eating plan approximately 85% of the time. Linda Mckinney states she is exercising 0 minutes 0 times per week.  Today's visit was #: 8 Starting weight: 245 lbs Starting date: 09/12/2021 Today's weight: 224 lbs Today's date: 01/31/2022 Total lbs lost to date: 21 lbs Total lbs lost since last in-office visit: 0  Interim History: Doing well with breakfast and lunch. Struggles with dinner. Has been eating out more. Has been craving ice cream. She is celebrating her mother's 70th birthday this weekend. Drinking water daily. Denies sugary drinks.   Subjective:   1. Vitamin D deficiency Klyn is currently taking prescription Vit D 50,000 IU every 3 days. Denies any side effects.  2. Constipation, unspecified constipation type Kla's last BM yesterday---hard. Prior BM 1 week ago. Not drinking enough water, not eating enough Veggies.  3. Essential hypertension Linda Mckinney is taking HCTZ 12.5 mg. Denies any side effects. Blood pressure at home 104-115/76-80. Pulse 82-89.  Assessment/Plan:   1. Vitamin D deficiency We will refill Vit d 50,000 IU once a week for 1 month with 0 refills.  Low Vitamin D level contributes to fatigue and are associated with obesity, breast, and colon cancer. She agrees to continue to take prescription Vitamin D @50 ,000 IU every week and will follow-up for routine testing of Vitamin D, at least 2-3 times per year to avoid over-replacement.   -Refill Vitamin D, Ergocalciferol, (DRISDOL) 1.25 MG (50000 UNIT) CAPS capsule; Take 1 capsule by mouth every 7 days.  Dispense: 5 capsule; Refill: 0  2. Constipation, unspecified constipation type Take Mirlax and Colace PRN. Increase H2O intake and veggies.  3. Essential hypertension Will continue to follow  up with PCP and continue medications as directed.  Tahjae is working on healthy weight loss and exercise to improve blood pressure control. We will watch for signs of hypotension as she continues her lifestyle modifications.   4. Obesity, current BMI 41.0 Linda Mckinney is currently in the action stage of change. As such, her goal is to continue with weight loss efforts. She has agreed to the Category 2 Plan.   Exercise goals: All adults should avoid inactivity. Some physical activity is better than none, and adults who participate in any amount of physical activity gain some health benefits.  Labs discussed during visit today. Dinner option discussed. Recommendations given.  Behavioral modification strategies: increasing lean protein intake, increasing water intake, and planning for success.  Linda Mckinney has agreed to follow-up with our clinic in 3 weeks. She was informed of the importance of frequent follow-up visits to maximize her success with intensive lifestyle modifications for her multiple health conditions.   Objective:   Blood pressure 124/86, pulse 68, temperature 98.1 F (36.7 C), height 5\' 2"  (1.575 m), weight 224 lb (101.6 kg), SpO2 99 %. Body mass index is 40.97 kg/m.  General: Cooperative, alert, well developed, in no acute distress. HEENT: Conjunctivae and lids unremarkable. Cardiovascular: Regular rhythm.  Lungs: Normal work of breathing. Neurologic: No focal deficits.   Lab Results  Component Value Date   CREATININE 0.84 12/17/2021   BUN 13 12/17/2021   NA 138 12/17/2021   K 4.1 12/17/2021   CL 101 12/17/2021   CO2 23 12/17/2021   Lab Results  Component Value Date   ALT 15 12/17/2021  AST 15 12/17/2021   ALKPHOS 69 12/17/2021   BILITOT 0.7 12/17/2021   Lab Results  Component Value Date   HGBA1C 5.7 (H) 12/17/2021   HGBA1C 6.1 (H) 09/12/2021   HGBA1C 6.1 05/09/2021   HGBA1C 6.1 11/08/2020   HGBA1C 6.1 05/01/2020   Lab Results  Component Value Date    INSULIN 12.2 12/17/2021   INSULIN 29.5 (H) 09/12/2021   Lab Results  Component Value Date   TSH 1.140 09/12/2021   Lab Results  Component Value Date   CHOL 156 05/09/2021   HDL 45.10 05/09/2021   LDLCALC 96 05/09/2021   TRIG 72.0 05/09/2021   CHOLHDL 3 05/09/2021   Lab Results  Component Value Date   VD25OH 35.0 12/17/2021   VD25OH 10.2 (L) 09/12/2021   Lab Results  Component Value Date   WBC 11.2 (H) 11/07/2021   HGB 11.7 (L) 11/07/2021   HCT 36.2 11/07/2021   MCV 88.5 11/07/2021   PLT 272 11/07/2021   No results found for: "IRON", "TIBC", "FERRITIN"  Attestation Statements:   Reviewed by clinician on day of visit: allergies, medications, problem list, medical history, surgical history, family history, social history, and previous encounter notes.  I, Brendell Tyus, RMA, am acting as transcriptionist for Irene Limbo, FNP.  I have reviewed the above documentation for accuracy and completeness, and I agree with the above. Irene Limbo, FNP

## 2022-02-12 ENCOUNTER — Encounter (INDEPENDENT_AMBULATORY_CARE_PROVIDER_SITE_OTHER): Payer: Self-pay | Admitting: Nurse Practitioner

## 2022-02-13 ENCOUNTER — Other Ambulatory Visit (HOSPITAL_COMMUNITY): Payer: Self-pay

## 2022-02-13 ENCOUNTER — Other Ambulatory Visit (INDEPENDENT_AMBULATORY_CARE_PROVIDER_SITE_OTHER): Payer: Self-pay | Admitting: Nurse Practitioner

## 2022-02-13 DIAGNOSIS — R638 Other symptoms and signs concerning food and fluid intake: Secondary | ICD-10-CM

## 2022-02-13 MED ORDER — BUPROPION HCL ER (SR) 150 MG PO TB12
150.0000 mg | ORAL_TABLET | Freq: Every day | ORAL | 0 refills | Status: DC
  Filled 2022-02-13: qty 30, 30d supply, fill #0

## 2022-02-14 ENCOUNTER — Other Ambulatory Visit (HOSPITAL_COMMUNITY): Payer: Self-pay

## 2022-02-20 ENCOUNTER — Encounter (INDEPENDENT_AMBULATORY_CARE_PROVIDER_SITE_OTHER): Payer: Self-pay

## 2022-03-04 ENCOUNTER — Ambulatory Visit (INDEPENDENT_AMBULATORY_CARE_PROVIDER_SITE_OTHER): Payer: 59 | Admitting: Nurse Practitioner

## 2022-03-04 ENCOUNTER — Encounter (INDEPENDENT_AMBULATORY_CARE_PROVIDER_SITE_OTHER): Payer: Self-pay | Admitting: Nurse Practitioner

## 2022-03-04 ENCOUNTER — Other Ambulatory Visit (HOSPITAL_COMMUNITY): Payer: Self-pay

## 2022-03-04 VITALS — BP 130/86 | HR 75 | Temp 98.3°F | Ht 62.0 in | Wt 227.0 lb

## 2022-03-04 DIAGNOSIS — R638 Other symptoms and signs concerning food and fluid intake: Secondary | ICD-10-CM

## 2022-03-04 DIAGNOSIS — E559 Vitamin D deficiency, unspecified: Secondary | ICD-10-CM

## 2022-03-04 DIAGNOSIS — Z6841 Body Mass Index (BMI) 40.0 and over, adult: Secondary | ICD-10-CM | POA: Diagnosis not present

## 2022-03-04 DIAGNOSIS — E669 Obesity, unspecified: Secondary | ICD-10-CM | POA: Diagnosis not present

## 2022-03-04 MED ORDER — VITAMIN D (ERGOCALCIFEROL) 1.25 MG (50000 UNIT) PO CAPS
50000.0000 [IU] | ORAL_CAPSULE | ORAL | 0 refills | Status: DC
  Filled 2022-03-04: qty 5, 35d supply, fill #0

## 2022-03-04 MED ORDER — BUPROPION HCL ER (SR) 150 MG PO TB12
150.0000 mg | ORAL_TABLET | Freq: Two times a day (BID) | ORAL | 0 refills | Status: DC
  Filled 2022-03-04: qty 60, 30d supply, fill #0

## 2022-03-09 ENCOUNTER — Other Ambulatory Visit (HOSPITAL_COMMUNITY): Payer: Self-pay

## 2022-03-11 ENCOUNTER — Other Ambulatory Visit (HOSPITAL_COMMUNITY): Payer: Self-pay

## 2022-03-12 NOTE — Progress Notes (Unsigned)
+       Chief Complaint:   OBESITY Linda Mckinney is here to discuss her progress with her obesity treatment plan along with follow-up of her obesity related diagnoses. Linda Mckinney is on the Category 2 Plan and states Linda Mckinney is following her eating plan approximately 65% of the time. Linda Mckinney states Linda Mckinney is exercising 0 minutes 0 times per week.  Today's visit was #: 9 Starting weight: 245 lbs Starting date: 09/12/2021 Today's weight: 227 lbs Today's date: 03/04/2022 Total lbs lost to date: 18 lbs Total lbs lost since last in-office visit: 0  Interim History: Linda Mckinney has done well overall with weight loss. Has been struggling with stress eating and her cravings have gotten worse. Linda Mckinney started school last week. 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 Linda Mckinney is worried that this will be a stressful semester. Substituting a protein shake for breakfast. Snacking is cheese, [place] or meals from [employer]. Drinking water daily but not enough. Linda Mckinney joined a gym but has not started yet.  Subjective:   1. Abnormal craving Linda Mckinney is struggling with stress eating and cravings. Taking Wellbutrin SR 150 mg. Denies any side effects.  2. Vitamin D deficiency Linda Mckinney is currently taking prescription Vit D 50,000 IU once a week. Denies any side effects.  Assessment/Plan:   1. Abnormal craving Increase Wellbutrin SR 150 mg to twice a day. We will refill for 1 month with 0 refills. Side effects discussed.   -Increase/Refill buPROPion (WELLBUTRIN SR) 150 MG 12 hr tablet; Take 1 tablet (150 mg total) by mouth 2 (two) times daily.  Dispense: 60 tablet; Refill: 0  2. Vitamin D deficiency We will refill Vit D 50,000 IU once a week for 1 month with 0 refills. Side effects discussed.   Low Vitamin D level contributes to fatigue and are associated with obesity, breast, and colon cancer. Linda Mckinney agrees to continue to take prescription Vitamin D @50 ,000 IU every week and will follow-up for routine testing of Vitamin D, at least 2-3  times per year to avoid over-replacement.   -Refill Vitamin D, Ergocalciferol, (DRISDOL) 1.25 MG (50000 UNIT) CAPS capsule; Take 1 capsule by mouth every 7 days.  Dispense: 5 capsule; Refill: 0  3. Obesity, current BMI 41.6 Lincoln is currently in the action stage of change. As such, her goal is to continue with weight loss efforts. Linda Mckinney has agreed to the Category 2 Plan.   Huldah will consider Saxenda at next visit.  Exercise goals: As is.  Behavioral modification strategies: increasing water intake, meal planning and cooking strategies, and keeping a strict food journal.  Linda Mckinney has agreed to follow-up with our clinic in 4 weeks. Linda Mckinney was informed of the importance of frequent follow-up visits to maximize her success with intensive lifestyle modifications for her multiple health conditions.   Objective:   Blood pressure 130/86, pulse 75, temperature 98.3 F (36.8 C), height 5\' 2"  (1.575 m), weight 227 lb (103 kg), SpO2 98 %. Body mass index is 41.52 kg/m.  General: Cooperative, alert, well developed, in no acute distress. HEENT: Conjunctivae and lids unremarkable. Cardiovascular: Regular rhythm.  Lungs: Normal work of breathing. Neurologic: No focal deficits.   Lab Results  Component Value Date   CREATININE 0.84 12/17/2021   BUN 13 12/17/2021   NA 138 12/17/2021   K 4.1 12/17/2021   CL 101 12/17/2021   CO2 23 12/17/2021   Lab Results  Component Value Date   ALT 15 12/17/2021   AST 15 12/17/2021   ALKPHOS 69 12/17/2021  BILITOT 0.7 12/17/2021   Lab Results  Component Value Date   HGBA1C 5.7 (H) 12/17/2021   HGBA1C 6.1 (H) 09/12/2021   HGBA1C 6.1 05/09/2021   HGBA1C 6.1 11/08/2020   HGBA1C 6.1 05/01/2020   Lab Results  Component Value Date   INSULIN 12.2 12/17/2021   INSULIN 29.5 (H) 09/12/2021   Lab Results  Component Value Date   TSH 1.140 09/12/2021   Lab Results  Component Value Date   CHOL 156 05/09/2021   HDL 45.10 05/09/2021   LDLCALC 96  05/09/2021   TRIG 72.0 05/09/2021   CHOLHDL 3 05/09/2021   Lab Results  Component Value Date   VD25OH 35.0 12/17/2021   VD25OH 10.2 (L) 09/12/2021   Lab Results  Component Value Date   WBC 11.2 (H) 11/07/2021   HGB 11.7 (L) 11/07/2021   HCT 36.2 11/07/2021   MCV 88.5 11/07/2021   PLT 272 11/07/2021   No results found for: "IRON", "TIBC", "FERRITIN"  Attestation Statements:   Reviewed by clinician on day of visit: allergies, medications, problem list, medical history, surgical history, family history, social history, and previous encounter notes.  I, Brendell Tyus, RMA, am acting as transcriptionist for Irene Limbo, FNP.  I have reviewed the above documentation for accuracy and completeness, and I agree with the above. Irene Limbo, FNP

## 2022-04-03 ENCOUNTER — Ambulatory Visit (INDEPENDENT_AMBULATORY_CARE_PROVIDER_SITE_OTHER): Payer: 59 | Admitting: Nurse Practitioner

## 2022-04-03 ENCOUNTER — Encounter (INDEPENDENT_AMBULATORY_CARE_PROVIDER_SITE_OTHER): Payer: Self-pay | Admitting: Nurse Practitioner

## 2022-04-03 ENCOUNTER — Other Ambulatory Visit (HOSPITAL_COMMUNITY): Payer: Self-pay

## 2022-04-03 VITALS — BP 128/87 | HR 73 | Temp 98.3°F | Ht 62.0 in | Wt 220.0 lb

## 2022-04-03 DIAGNOSIS — Z6841 Body Mass Index (BMI) 40.0 and over, adult: Secondary | ICD-10-CM

## 2022-04-03 DIAGNOSIS — E559 Vitamin D deficiency, unspecified: Secondary | ICD-10-CM

## 2022-04-03 DIAGNOSIS — E669 Obesity, unspecified: Secondary | ICD-10-CM

## 2022-04-03 DIAGNOSIS — R638 Other symptoms and signs concerning food and fluid intake: Secondary | ICD-10-CM | POA: Diagnosis not present

## 2022-04-03 MED ORDER — VITAMIN D (ERGOCALCIFEROL) 1.25 MG (50000 UNIT) PO CAPS
50000.0000 [IU] | ORAL_CAPSULE | ORAL | 0 refills | Status: DC
  Filled 2022-04-03: qty 5, 35d supply, fill #0

## 2022-04-04 NOTE — Progress Notes (Signed)
Chief Complaint:   OBESITY Cortney is here to discuss her progress with her obesity treatment plan along with follow-up of her obesity related diagnoses. Linda Mckinney is on the Category 2 Plan and states she is following her eating plan approximately 80% of the time. Linda Mckinney states she is going to the gym 30 minutes 1 times per week.  Today's visit was #: 10 Starting weight: 245 lbs Starting date: 09/12/2021 Today's weight: 220 lbs Today's date: 04/03/2022 Total lbs lost to date: 25 lbs Total lbs lost since last in-office visit: 7  Interim History: Linda Mckinney has done well with weight loss since her last visit. She is getting married 04/12/22 and going on a cruise for honeymoon. Drinking water and started going to the gym recently Calories 1200-1300.  Subjective:   1. Abnormal craving Linda Mckinney's Wellbutrin was increased after her last visit to **8. Denies any side effects. Has helped with polyphagia and cravings.  2. Vitamin D deficiency Linda Mckinney is currently taking prescription Vit D 50,000 IU once a week.   Assessment/Plan:   1. Abnormal craving Continue taking Wellbutrin SR 150 mg twice a day. Side effects discussed.   2. Vitamin D deficiency We will refill Vit D 50,000 IU weekly for 1 month with 0 refills. Low Vitamin D level contributes to fatigue and are associated with obesity, breast, and colon cancer. She agrees to continue to take prescription Vitamin D @50 ,000 IU every week and will follow-up for routine testing of Vitamin D, at least 2-3 times per year to avoid over-replacement.   -Refill Vitamin D, Ergocalciferol, (DRISDOL) 1.25 MG (50000 UNIT) CAPS capsule; Take 1 capsule (50,000 Units total) by mouth every 7 (seven) days.  Dispense: 5 capsule; Refill: 0  3. Obesity, current BMI 40.3 Linda Mckinney is currently in the action stage of change. As such, her goal is to continue with weight loss efforts. She has agreed to the Category 2 Plan.   Exercise goals: All adults should avoid  inactivity. Some physical activity is better than none, and adults who participate in any amount of physical activity gain some health benefits.  Behavioral modification strategies: increasing water intake, travel eating strategies, and planning for success.  Linda Mckinney has agreed to follow-up with our clinic in 4 weeks. She was informed of the importance of frequent follow-up visits to maximize her success with intensive lifestyle modifications for her multiple health conditions.   Objective:   Blood pressure 128/87, pulse 73, temperature 98.3 F (36.8 C), height 5\' 2"  (1.575 m), weight 220 lb (99.8 kg), SpO2 100 %. Body mass index is 40.24 kg/m.  General: Cooperative, alert, well developed, in no acute distress. HEENT: Conjunctivae and lids unremarkable. Cardiovascular: Regular rhythm.  Lungs: Normal work of breathing. Neurologic: No focal deficits.   Lab Results  Component Value Date   CREATININE 0.84 12/17/2021   BUN 13 12/17/2021   NA 138 12/17/2021   K 4.1 12/17/2021   CL 101 12/17/2021   CO2 23 12/17/2021   Lab Results  Component Value Date   ALT 15 12/17/2021   AST 15 12/17/2021   ALKPHOS 69 12/17/2021   BILITOT 0.7 12/17/2021   Lab Results  Component Value Date   HGBA1C 5.7 (H) 12/17/2021   HGBA1C 6.1 (H) 09/12/2021   HGBA1C 6.1 05/09/2021   HGBA1C 6.1 11/08/2020   HGBA1C 6.1 05/01/2020   Lab Results  Component Value Date   INSULIN 12.2 12/17/2021   INSULIN 29.5 (H) 09/12/2021   Lab Results  Component Value Date  TSH 1.140 09/12/2021   Lab Results  Component Value Date   CHOL 156 05/09/2021   HDL 45.10 05/09/2021   LDLCALC 96 05/09/2021   TRIG 72.0 05/09/2021   CHOLHDL 3 05/09/2021   Lab Results  Component Value Date   VD25OH 35.0 12/17/2021   VD25OH 10.2 (L) 09/12/2021   Lab Results  Component Value Date   WBC 11.2 (H) 11/07/2021   HGB 11.7 (L) 11/07/2021   HCT 36.2 11/07/2021   MCV 88.5 11/07/2021   PLT 272 11/07/2021   No results  found for: "IRON", "TIBC", "FERRITIN"  Attestation Statements:   Reviewed by clinician on day of visit: allergies, medications, problem list, medical history, surgical history, family history, social history, and previous encounter notes.  I, Brendell Tyus, RMA, am acting as transcriptionist for Everardo Pacific, FNP.  I have reviewed the above documentation for accuracy and completeness, and I agree with the above. Everardo Pacific, FNP

## 2022-05-02 ENCOUNTER — Other Ambulatory Visit: Payer: Self-pay | Admitting: Primary Care

## 2022-05-02 ENCOUNTER — Other Ambulatory Visit (HOSPITAL_COMMUNITY): Payer: Self-pay

## 2022-05-02 ENCOUNTER — Other Ambulatory Visit: Payer: Self-pay | Admitting: Podiatry

## 2022-05-02 DIAGNOSIS — I10 Essential (primary) hypertension: Secondary | ICD-10-CM

## 2022-05-02 MED ORDER — HYDROCHLOROTHIAZIDE 12.5 MG PO TABS
12.5000 mg | ORAL_TABLET | Freq: Every day | ORAL | 0 refills | Status: DC
  Filled 2022-05-02: qty 90, 90d supply, fill #0

## 2022-05-03 ENCOUNTER — Other Ambulatory Visit (HOSPITAL_COMMUNITY): Payer: Self-pay

## 2022-05-07 ENCOUNTER — Other Ambulatory Visit (HOSPITAL_COMMUNITY): Payer: Self-pay

## 2022-05-07 ENCOUNTER — Other Ambulatory Visit: Payer: Self-pay | Admitting: Podiatry

## 2022-05-07 ENCOUNTER — Ambulatory Visit: Payer: 59 | Admitting: Nurse Practitioner

## 2022-05-07 ENCOUNTER — Encounter: Payer: Self-pay | Admitting: Nurse Practitioner

## 2022-05-07 VITALS — BP 136/96 | HR 80 | Temp 98.3°F | Ht 62.0 in | Wt 230.0 lb

## 2022-05-07 DIAGNOSIS — R638 Other symptoms and signs concerning food and fluid intake: Secondary | ICD-10-CM

## 2022-05-07 DIAGNOSIS — E559 Vitamin D deficiency, unspecified: Secondary | ICD-10-CM

## 2022-05-07 DIAGNOSIS — E669 Obesity, unspecified: Secondary | ICD-10-CM | POA: Diagnosis not present

## 2022-05-07 DIAGNOSIS — I10 Essential (primary) hypertension: Secondary | ICD-10-CM | POA: Diagnosis not present

## 2022-05-07 DIAGNOSIS — Z6841 Body Mass Index (BMI) 40.0 and over, adult: Secondary | ICD-10-CM

## 2022-05-07 DIAGNOSIS — E66813 Obesity, class 3: Secondary | ICD-10-CM

## 2022-05-07 MED ORDER — VITAMIN D (ERGOCALCIFEROL) 1.25 MG (50000 UNIT) PO CAPS
50000.0000 [IU] | ORAL_CAPSULE | ORAL | 0 refills | Status: DC
  Filled 2022-05-07: qty 5, 35d supply, fill #0

## 2022-05-07 MED ORDER — BUPROPION HCL ER (SR) 150 MG PO TB12
150.0000 mg | ORAL_TABLET | Freq: Two times a day (BID) | ORAL | 0 refills | Status: DC
  Filled 2022-05-07: qty 60, 30d supply, fill #0

## 2022-05-13 NOTE — Progress Notes (Signed)
Chief Complaint:   OBESITY Linda Mckinney is here to discuss her progress with her obesity treatment plan along with follow-up of her obesity related diagnoses. Linda Mckinney is on the Category 2 Plan and states she is following her eating plan approximately 50% of the time. Linda Mckinney states she is exercising 0 minutes 0 times per week.  Today's visit was #: 11 Starting weight: 245 lbs Starting date: 09/12/2021 Today's weight: 230 lb Today's date: 05/07/2022 Total lbs lost to date: 15 lbs Total lbs lost since last in-office visit: 0  Interim History: Linda Mckinney has gotten married and went on her Honeymoon since her last visit. She is struggling with getting back on track. She has been eating out more and eating more with her family to celebrate her marriage. Drinking water daily but not enough. She does well with breakfast but struggling with lunch and dinner.  Subjective:   1. Vitamin D deficiency Linda Mckinney is currently taking prescription Vit D 50,000 IU once a week. Denies any nausea, vomiting or muscle weakness.  2. Abnormal craving Linda Mckinney is taking Wellbutrin SR 150 mg daily. She has not been taking second dose. Does feel that when taking twice a day helped better with cravings and mood. Struggling with cravings.  3. Essential hypertension Linda Mckinney is taking HCTZ 12.5 mg. Denies any side effects. She has not been taking Cozaar. Denies any chest pain,shortness of breath or palpitations.  Assessment/Plan:   1. Vitamin D deficiency We will refill Vit d 50,000 IU once a week for 1 month with 0 refills.  Low Vitamin D level contributes to fatigue and are associated with obesity, breast, and colon cancer. She agrees to continue to take prescription Vitamin D @50 ,000 IU every week and will follow-up for routine testing of Vitamin D, at least 2-3 times per year to avoid over-replacement.   -Refill Vitamin D, Ergocalciferol, (DRISDOL) 1.25 MG (50000 UNIT) CAPS capsule; Take 1 capsule (50,000 Units  total) by mouth every 7 (seven) days.  Dispense: 5 capsule; Refill: 0  2. Abnormal craving Patient will Start back taking Wellbutrin SR 150 Mg twice a day. Side effects discussed.    -Refill buPROPion (WELLBUTRIN SR) 150 MG 12 hr tablet; Take 1 tablet (150 mg total) by mouth 2 (two) times daily.  Dispense: 60 tablet; Refill: 0  3. Essential hypertension Continue HCTZ 12.5 mg and restart Cozaar 50 mg, 1/2 tablet. Keep appointment with PCP next week for blood pressure recheck.   Linda Mckinney is working on healthy weight loss and exercise to improve blood pressure control. We will watch for signs of hypotension as she continues her lifestyle modifications.   4. Obesity, current BMI 42.1 Linda Mckinney is currently in the action stage of change. As such, her goal is to continue with weight loss efforts. She has agreed to the Category 2 Plan.   Exercise goals: All adults should avoid inactivity. Some physical activity is better than none, and adults who participate in any amount of physical activity gain some health benefits.  Linda Mckinney will keep appointment with PCP for CPE and labs on 05/14/22.  Behavioral modification strategies: increasing water intake, meal planning and cooking strategies, and holiday eating strategies .  Linda Mckinney has agreed to follow-up with our clinic in 3 weeks. She was informed of the importance of frequent follow-up visits to maximize her success with intensive lifestyle modifications for her multiple health conditions.   Objective:   Blood pressure (!) 136/96, pulse 80, temperature 98.3 F (36.8 C), temperature source Oral, height 5'  2" (1.575 m), weight 230 lb (104.3 kg), SpO2 100 %. Body mass index is 42.07 kg/m.  General: Cooperative, alert, well developed, in no acute distress. HEENT: Conjunctivae and lids unremarkable. Cardiovascular: Regular rhythm.  Lungs: Normal work of breathing. Neurologic: No focal deficits.   Lab Results  Component Value Date   CREATININE  0.84 12/17/2021   BUN 13 12/17/2021   NA 138 12/17/2021   K 4.1 12/17/2021   CL 101 12/17/2021   CO2 23 12/17/2021   Lab Results  Component Value Date   ALT 15 12/17/2021   AST 15 12/17/2021   ALKPHOS 69 12/17/2021   BILITOT 0.7 12/17/2021   Lab Results  Component Value Date   HGBA1C 5.7 (H) 12/17/2021   HGBA1C 6.1 (H) 09/12/2021   HGBA1C 6.1 05/09/2021   HGBA1C 6.1 11/08/2020   HGBA1C 6.1 05/01/2020   Lab Results  Component Value Date   INSULIN 12.2 12/17/2021   INSULIN 29.5 (H) 09/12/2021   Lab Results  Component Value Date   TSH 1.140 09/12/2021   Lab Results  Component Value Date   CHOL 156 05/09/2021   HDL 45.10 05/09/2021   LDLCALC 96 05/09/2021   TRIG 72.0 05/09/2021   CHOLHDL 3 05/09/2021   Lab Results  Component Value Date   VD25OH 35.0 12/17/2021   VD25OH 10.2 (L) 09/12/2021   Lab Results  Component Value Date   WBC 11.2 (H) 11/07/2021   HGB 11.7 (L) 11/07/2021   HCT 36.2 11/07/2021   MCV 88.5 11/07/2021   PLT 272 11/07/2021   No results found for: "IRON", "TIBC", "FERRITIN"  Attestation Statements:   Reviewed by clinician on day of visit: allergies, medications, problem list, medical history, surgical history, family history, social history, and previous encounter notes.  I, Brendell Tyus, RMA, am acting as transcriptionist for Everardo Pacific, FNP.  I have reviewed the above documentation for accuracy and completeness, and I agree with the above. Everardo Pacific, FNP

## 2022-05-14 ENCOUNTER — Other Ambulatory Visit (HOSPITAL_COMMUNITY): Payer: Self-pay

## 2022-05-14 ENCOUNTER — Encounter: Payer: Self-pay | Admitting: Primary Care

## 2022-05-14 ENCOUNTER — Ambulatory Visit (INDEPENDENT_AMBULATORY_CARE_PROVIDER_SITE_OTHER): Payer: 59 | Admitting: Primary Care

## 2022-05-14 VITALS — BP 118/76 | HR 76 | Temp 97.0°F | Ht 62.25 in | Wt 232.0 lb

## 2022-05-14 DIAGNOSIS — I10 Essential (primary) hypertension: Secondary | ICD-10-CM

## 2022-05-14 DIAGNOSIS — Z0000 Encounter for general adult medical examination without abnormal findings: Secondary | ICD-10-CM | POA: Diagnosis not present

## 2022-05-14 DIAGNOSIS — M5441 Lumbago with sciatica, right side: Secondary | ICD-10-CM | POA: Diagnosis not present

## 2022-05-14 DIAGNOSIS — K219 Gastro-esophageal reflux disease without esophagitis: Secondary | ICD-10-CM

## 2022-05-14 DIAGNOSIS — M9905 Segmental and somatic dysfunction of pelvic region: Secondary | ICD-10-CM | POA: Diagnosis not present

## 2022-05-14 DIAGNOSIS — M9903 Segmental and somatic dysfunction of lumbar region: Secondary | ICD-10-CM | POA: Diagnosis not present

## 2022-05-14 DIAGNOSIS — M9902 Segmental and somatic dysfunction of thoracic region: Secondary | ICD-10-CM | POA: Diagnosis not present

## 2022-05-14 DIAGNOSIS — F419 Anxiety disorder, unspecified: Secondary | ICD-10-CM | POA: Diagnosis not present

## 2022-05-14 DIAGNOSIS — R7303 Prediabetes: Secondary | ICD-10-CM | POA: Diagnosis not present

## 2022-05-14 DIAGNOSIS — M76821 Posterior tibial tendinitis, right leg: Secondary | ICD-10-CM

## 2022-05-14 DIAGNOSIS — M546 Pain in thoracic spine: Secondary | ICD-10-CM | POA: Diagnosis not present

## 2022-05-14 DIAGNOSIS — M955 Acquired deformity of pelvis: Secondary | ICD-10-CM | POA: Diagnosis not present

## 2022-05-14 DIAGNOSIS — E559 Vitamin D deficiency, unspecified: Secondary | ICD-10-CM

## 2022-05-14 LAB — BASIC METABOLIC PANEL
BUN: 16 mg/dL (ref 6–23)
CO2: 29 mEq/L (ref 19–32)
Calcium: 9.4 mg/dL (ref 8.4–10.5)
Chloride: 103 mEq/L (ref 96–112)
Creatinine, Ser: 0.94 mg/dL (ref 0.40–1.20)
GFR: 76.89 mL/min (ref >60.00)
Glucose, Bld: 93 mg/dL (ref 70–99)
Potassium: 3.8 mEq/L (ref 3.5–5.1)
Sodium: 138 mEq/L (ref 135–145)

## 2022-05-14 LAB — HEMOGLOBIN A1C: Hgb A1c MFr Bld: 6 % (ref 4.6–6.5)

## 2022-05-14 LAB — VITAMIN D 25 HYDROXY (VIT D DEFICIENCY, FRACTURES): VITD: 26.45 ng/mL — ABNORMAL LOW (ref 30.00–100.00)

## 2022-05-14 LAB — LIPID PANEL
Cholesterol: 157 mg/dL (ref 0–200)
HDL: 48.6 mg/dL (ref >39.00)
LDL Cholesterol: 100 mg/dL — ABNORMAL HIGH (ref 0–99)
NonHDL: 108.2
Total CHOL/HDL Ratio: 3
Triglycerides: 40 mg/dL (ref 0.0–149.0)
VLDL: 8 mg/dL (ref 0.0–40.0)

## 2022-05-14 MED ORDER — CELECOXIB 100 MG PO CAPS
100.0000 mg | ORAL_CAPSULE | Freq: Two times a day (BID) | ORAL | 3 refills | Status: DC
  Filled 2022-05-14: qty 60, 30d supply, fill #0

## 2022-05-14 NOTE — Assessment & Plan Note (Signed)
Controlled.  Continue bupropion SR 150 mg twice daily.

## 2022-05-14 NOTE — Assessment & Plan Note (Signed)
Immunizations UTD. Pap smear UTD, follows with GYN.   Discussed the importance of a healthy diet and regular exercise in order for weight loss, and to reduce the risk of further co-morbidity.  Exam stable. Labs pending.  Follow up in 1 year for repeat physical.  

## 2022-05-14 NOTE — Progress Notes (Signed)
Subjective:    Patient ID: Linda Mckinney, female    DOB: 04-02-1986, 35 y.o.   MRN: [id number]  HPI  Linda Mckinney is a very pleasant 36 y.o. female who presents today for complete physical and follow up of chronic conditions.  Immunizations: -Tetanus: 2017 -Influenza: Completed this season   Diet: Fair diet.  Exercise: No regular exercise.  Eye exam: Completed several years ago Dental exam: Completes semi-annually   Pap Smear: Completed in 2021, follows with GYN  BP Readings from Last 3 Encounters:  05/14/22 118/76  05/07/22 (!) 136/96  04/03/22 128/87   Wt Readings from Last 3 Encounters:  05/14/22 232 lb (105.2 kg)  05/07/22 230 lb (104.3 kg)  04/03/22 220 lb (99.8 kg)          Review of Systems  Constitutional:  Negative for unexpected weight change.  HENT:  Negative for rhinorrhea.   Respiratory:  Negative for cough and shortness of breath.   Cardiovascular:  Negative for chest pain.  Gastrointestinal:  Negative for constipation and diarrhea.  Genitourinary:  Negative for difficulty urinating.  Musculoskeletal:  Negative for arthralgias and myalgias.  Skin:  Negative for rash.  Allergic/Immunologic: Negative for environmental allergies.  Neurological:  Negative for dizziness and headaches.  Psychiatric/Behavioral:  The patient is not nervous/anxious.          Past Medical History:  Diagnosis Date   Anxiety    GERD (gastroesophageal reflux disease)    Hypertension    Pre-diabetes     Social History   Socioeconomic History   Marital status: Married    Spouse name: Not on file   Number of children: 0   Years of education: Not on file   Highest education level: Associate degree: occupational, Hotel manager, or vocational program  Occupational History    Comment: Nurse Tech  Tobacco Use   Smoking status: Never   Smokeless tobacco: Never  Vaping Use   Vaping Use: Never used  Substance and Sexual Activity   Alcohol use: No     Alcohol/week: 0.0 standard drinks of alcohol   Drug use: No   Sexual activity: Yes    Partners: Male    Birth control/protection: Condom  Other Topics Concern   Not on file  Social History Narrative   Single.   No children.    09/07/19 Works as a Quarry manager at [employer]   Enjoys relaxing.    Social Determinants of Health   Financial Resource Strain: Not on file  Food Insecurity: Not on file  Transportation Needs: Not on file  Physical Activity: Not on file  Stress: Not on file  Social Connections: Not on file  Intimate Partner Violence: Not on file    Past Surgical History:  Procedure Laterality Date   BUNIONECTOMY  2015   CARPAL TUNNEL RELEASE Right 01/30/2021   Procedure: RIGHT CARPAL [place];  Surgeon: Daryll Brod, MD;  Location: [facility];  Service: Orthopedics;  Laterality: Right;  22 MIN   CHOLECYSTECTOMY      Family History  Problem Relation Age of Onset   Hypertension Mother    Depression Mother    Sleep apnea Mother    Obesity Mother    Diabetes Father    Stroke Father    Heart disease Father    Obesity Father    Hypertension Sister    Cancer Maternal Grandmother 55       breast    No Known Allergies  Current Outpatient Medications  on File Prior to Visit  Medication Sig Dispense Refill   buPROPion (WELLBUTRIN SR) 150 MG 12 hr tablet Take 1 tablet (150 mg total) by mouth 2 (two) times daily. 60 tablet 0   hydrochlorothiazide (HYDRODIURIL) 12.5 MG tablet Take 1 tablet (12.5 mg total) by mouth daily. For blood pressure. Office visit required for further refills. 90 tablet 0   losartan (COZAAR) 50 MG tablet Take 1 tablet (50 mg total) by mouth daily. 30 tablet 0   omeprazole (PRILOSEC) 20 MG capsule Take 20 mg by mouth daily.     Vitamin D, Ergocalciferol, (DRISDOL) 1.25 MG (50000 UNIT) CAPS capsule Take 1 capsule (50,000 Units total) by mouth every 7 (seven) days. 5 capsule 0   celecoxib (CELEBREX) 100 MG capsule Take 1 capsule (100 mg  total) by mouth 2 (two) times daily. 60 capsule 3   No current facility-administered medications on file prior to visit.    BP 118/76   Pulse 76   Temp (!) 97 F (36.1 C) (Temporal)   Ht 5' 2.25" (1.581 m)   Wt 232 lb (105.2 kg)   LMP 04/21/2022 (Approximate)   SpO2 98%   BMI 42.09 kg/m  Objective:   Physical Exam HENT:     Right Ear: Tympanic membrane and ear canal normal.     Left Ear: Tympanic membrane and ear canal normal.     Nose: Nose normal.  Eyes:     Conjunctiva/sclera: Conjunctivae normal.     Pupils: Pupils are equal, round, and reactive to light.  Neck:     Thyroid: No thyromegaly.  Cardiovascular:     Rate and Rhythm: Normal rate and regular rhythm.     Heart sounds: No murmur heard. Pulmonary:     Effort: Pulmonary effort is normal.     Breath sounds: Normal breath sounds. No rales.  Abdominal:     General: Bowel sounds are normal.     Palpations: Abdomen is soft.     Tenderness: There is no abdominal tenderness.  Musculoskeletal:        General: Normal range of motion.     Cervical back: Neck supple.  Lymphadenopathy:     Cervical: No cervical adenopathy.  Skin:    General: Skin is warm and dry.     Findings: No rash.  Neurological:     Mental Status: She is alert and oriented to person, place, and time.     Cranial Nerves: No cranial nerve deficit.     Deep Tendon Reflexes: Reflexes are normal and symmetric.  Psychiatric:        Mood and Affect: Mood normal.           Assessment & Plan:   Problem List Items Addressed This Visit       Cardiovascular and Mediastinum   Essential hypertension    Controlled.  Continue HCTZ 12.5 mg daily. Remain off losartan 50 mg for now. She will work on lifestyle changes.   CMP pending      Relevant Orders   Basic metabolic panel     Digestive   GERD (gastroesophageal reflux disease)    Improved with weight loss.   Continue omeprazole 20 mg PRN. Continue to work on weight loss.         Musculoskeletal and Integument   Tibial tendonitis, posterior, right    Chronic and continued pain.  Discussed to avoid recurrent use of NSAID's.  She will follow up with her podiatrist.  Other   Preventative health care - Primary    Immunizations UTD. Pap smear UTD, follows with GYN.  Discussed the importance of a healthy diet and regular exercise in order for weight loss, and to reduce the risk of further co-morbidity.  Exam stable. Labs pending.  Follow up in 1 year for repeat physical.       Prediabetes    Commended her on her attempts for weight loss, encouraged to continue.  Repeat A1C pending.      Relevant Orders   Lipid panel   Hemoglobin A1c   Anxiety    Controlled.  Continue bupropion SR 150 mg twice daily.       Vitamin D deficiency    Continue vitamin D 50,000 IU weekly. Repeat vitamin D level pending.      Relevant Orders   VITAMIN D 25 Hydroxy (Vit-D Deficiency, Fractures)       Pleas Koch, NP

## 2022-05-14 NOTE — Assessment & Plan Note (Signed)
Commended her on her attempts for weight loss, encouraged to continue.  Repeat A1C pending.

## 2022-05-14 NOTE — Assessment & Plan Note (Signed)
Improved with weight loss.   Continue omeprazole 20 mg PRN. Continue to work on weight loss.

## 2022-05-14 NOTE — Assessment & Plan Note (Signed)
Controlled.  Continue HCTZ 12.5 mg daily. Remain off losartan 50 mg for now. She will work on lifestyle changes.   CMP pending

## 2022-05-14 NOTE — Assessment & Plan Note (Signed)
Continue vitamin D 50,000 IU weekly. Repeat vitamin D level pending.

## 2022-05-14 NOTE — Patient Instructions (Signed)
Stop by the lab prior to leaving today. I will notify you of your results once received.   Remain off of your losartan blood pressure medication for now.   It was a pleasure to see you today!  Preventive Care 38-38 Years Old, Female Preventive care refers to lifestyle choices and visits with your health care provider that can promote health and wellness. Preventive care visits are also called wellness exams. What can I expect for my preventive care visit? Counseling During your preventive care visit, your health care provider may ask about your: Medical history, including: Past medical problems. Family medical history. Pregnancy history. Current health, including: Menstrual cycle. Method of birth control. Emotional well-being. Home life and relationship well-being. Sexual activity and sexual health. Lifestyle, including: Alcohol, nicotine or tobacco, and drug use. Access to firearms. Diet, exercise, and sleep habits. Work and work Statistician. Sunscreen use. Safety issues such as seatbelt and bike helmet use. Physical exam Your health care provider may check your: Height and weight. These may be used to calculate your BMI (body mass index). BMI is a measurement that tells if you are at a healthy weight. Waist circumference. This measures the distance around your waistline. This measurement also tells if you are at a healthy weight and may help predict your risk of certain diseases, such as type 2 diabetes and high blood pressure. Heart rate and blood pressure. Body temperature. Skin for abnormal spots. What immunizations do I need?  Vaccines are usually given at various ages, according to a schedule. Your health care provider will recommend vaccines for you based on your age, medical history, and lifestyle or other factors, such as travel or where you work. What tests do I need? Screening Your health care provider may recommend screening tests for certain conditions. This may  include: Pelvic exam and Pap test. Lipid and cholesterol levels. Diabetes screening. This is done by checking your blood sugar (glucose) after you have not eaten for a while (fasting). Hepatitis B test. Hepatitis C test. HIV (human immunodeficiency virus) test. STI (sexually transmitted infection) testing, if you are at risk. BRCA-related cancer screening. This may be done if you have a family history of breast, ovarian, tubal, or peritoneal cancers. Talk with your health care provider about your test results, treatment options, and if necessary, the need for more tests. Follow these instructions at home: Eating and drinking  Eat a healthy diet that includes fresh fruits and vegetables, whole grains, lean protein, and low-fat dairy products. Take vitamin and mineral supplements as recommended by your health care provider. Do not drink alcohol if: Your health care provider tells you not to drink. You are pregnant, may be pregnant, or are planning to become pregnant. If you drink alcohol: Limit how much you have to 0-1 drink a day. Know how much alcohol is in your drink. In the [place] one drink equals one 12 oz bottle of beer (355 mL), one 5 oz glass of wine (148 mL), or one 1 oz glass of hard liquor (44 mL). Lifestyle Brush your teeth every morning and night with fluoride toothpaste. Floss one time each day. Exercise for at least 30 minutes 5 or more days each week. Do not use any products that contain nicotine or tobacco. These products include cigarettes, chewing tobacco, and vaping devices, such as e-cigarettes. If you need help quitting, ask your health care provider. Do not use drugs. If you are sexually active, practice safe sex. Use a condom or other form of protection to  prevent STIs. If you do not wish to become pregnant, use a form of birth control. If you plan to become pregnant, see your health care provider for a prepregnancy visit. Find healthy ways to manage stress, such  as: Meditation, yoga, or listening to music. Journaling. Talking to a trusted person. Spending time with friends and family. Minimize exposure to UV radiation to reduce your risk of skin cancer. Safety Always wear your seat belt while driving or riding in a vehicle. Do not drive: If you have been drinking alcohol. Do not ride with someone who has been drinking. If you have been using any mind-altering substances or drugs. While texting. When you are tired or distracted. Wear a helmet and other protective equipment during sports activities. If you have firearms in your house, make sure you follow all gun safety procedures. Seek help if you have been physically or sexually abused. What's next? Go to your health care provider once a year for an annual wellness visit. Ask your health care provider how often you should have your eyes and teeth checked. Stay up to date on all vaccines. This information is not intended to replace advice given to you by your health care provider. Make sure you discuss any questions you have with your health care provider. Document Revised: 12/27/2020 Document Reviewed: 12/27/2020 Elsevier Patient Education  [place].

## 2022-05-14 NOTE — Assessment & Plan Note (Signed)
Chronic and continued pain.  Discussed to avoid recurrent use of NSAID's.  She will follow up with her podiatrist.

## 2022-05-28 ENCOUNTER — Other Ambulatory Visit (HOSPITAL_COMMUNITY): Payer: Self-pay

## 2022-05-28 ENCOUNTER — Encounter: Payer: Self-pay | Admitting: Nurse Practitioner

## 2022-05-28 ENCOUNTER — Ambulatory Visit: Payer: 59 | Admitting: Nurse Practitioner

## 2022-05-28 VITALS — BP 131/84 | HR 76 | Temp 98.5°F | Ht 62.0 in | Wt 224.0 lb

## 2022-05-28 DIAGNOSIS — Z6841 Body Mass Index (BMI) 40.0 and over, adult: Secondary | ICD-10-CM | POA: Diagnosis not present

## 2022-05-28 DIAGNOSIS — R7303 Prediabetes: Secondary | ICD-10-CM | POA: Diagnosis not present

## 2022-05-28 DIAGNOSIS — E669 Obesity, unspecified: Secondary | ICD-10-CM

## 2022-05-28 DIAGNOSIS — E559 Vitamin D deficiency, unspecified: Secondary | ICD-10-CM

## 2022-05-28 MED ORDER — VITAMIN D (ERGOCALCIFEROL) 1.25 MG (50000 UNIT) PO CAPS
50000.0000 [IU] | ORAL_CAPSULE | ORAL | 0 refills | Status: DC
  Filled 2022-05-28 – 2022-05-30 (×2): qty 10, 35d supply, fill #0

## 2022-05-30 ENCOUNTER — Other Ambulatory Visit (HOSPITAL_COMMUNITY): Payer: Self-pay

## 2022-05-30 NOTE — Progress Notes (Signed)
Chief Complaint:   OBESITY Linda Mckinney is here to discuss her progress with her obesity treatment plan along with follow-up of her obesity related diagnoses. Linda Mckinney is on the Category 2 Plan and states she is following her eating plan approximately 70% of the time. Linda Mckinney states she is exercising 0 minutes 0 times per week.  Today's visit was #: 12 Starting weight: 245 lbs Starting date: 09/12/2021 Today's weight: 224 lbs Today's date: 05/30/2022 Total lbs lost to date: 21 lbs Total lbs lost since last in-office visit: 6  Interim History: Linda Mckinney has done well with weight loss since her last visit. She has been cooking more and grilling at home. She is not skipping meals. Drinking more water. She has a celebration for her sister this weekend.   Subjective:   1. Prediabetes Linda Mckinney's A1c was 6.0 on 10/31. She has never been on medications. Notes some hunger and cravings. Labs reviewed with her today.  2. Vitamin D deficiency Linda Mckinney is currently taking prescription Vit D 50,000 IU twice a week. Denies any side effects. Last vit D was 26.45 on 10/31. Notes feeling better since increasing Vit D to twice weekly.   Assessment/Plan:   1. Prediabetes Linda Mckinney will continue to work on weight loss, exercise, and decreasing simple carbohydrates to help decrease the risk of diabetes.    2. Vitamin D deficiency We will refill Vit D 50,000 IU by mouth twice a week for 1 month with 0 refills.  Side effects discussed.    -Refill Vitamin D, Ergocalciferol, (DRISDOL) 1.25 MG (50000 UNIT) CAPS capsule; Take 1 capsule (50,000 Units total) by mouth twice a week  Dispense: 10 capsule; Refill: 0  3. Obesity, current BMI 42.1 Linda Mckinney is currently in the action stage of change. As such, her goal is to continue with weight loss efforts. She has agreed to the Category 2 Plan.   Exercise goals: All adults should avoid inactivity. Some physical activity is better than none, and adults who participate in  any amount of physical activity gain some health benefits.  Behavioral modification strategies: no skipping meals, meal planning and cooking strategies, and holiday eating strategies .  Linda Mckinney has agreed to follow-up with our clinic in 4 weeks. She was informed of the importance of frequent follow-up visits to maximize her success with intensive lifestyle modifications for her multiple health conditions.   Objective:   Blood pressure 131/84, pulse 76, temperature 98.5 F (36.9 C), temperature source Oral, height 5\' 2"  (1.575 m), weight 224 lb (101.6 kg), last menstrual period 04/21/2022, SpO2 100 %. Body mass index is 40.97 kg/m.  General: Cooperative, alert, well developed, in no acute distress. HEENT: Conjunctivae and lids unremarkable. Cardiovascular: Regular rhythm.  Lungs: Normal work of breathing. Neurologic: No focal deficits.   Lab Results  Component Value Date   CREATININE 0.94 05/14/2022   BUN 16 05/14/2022   NA 138 05/14/2022   K 3.8 05/14/2022   CL 103 05/14/2022   CO2 29 05/14/2022   Lab Results  Component Value Date   ALT 15 12/17/2021   AST 15 12/17/2021   ALKPHOS 69 12/17/2021   BILITOT 0.7 12/17/2021   Lab Results  Component Value Date   HGBA1C 6.0 05/14/2022   HGBA1C 5.7 (H) 12/17/2021   HGBA1C 6.1 (H) 09/12/2021   HGBA1C 6.1 05/09/2021   HGBA1C 6.1 11/08/2020   Lab Results  Component Value Date   INSULIN 12.2 12/17/2021   INSULIN 29.5 (H) 09/12/2021   Lab Results  Component  Value Date   TSH 1.140 09/12/2021   Lab Results  Component Value Date   CHOL 157 05/14/2022   HDL 48.60 05/14/2022   LDLCALC 100 (H) 05/14/2022   TRIG 40.0 05/14/2022   CHOLHDL 3 05/14/2022   Lab Results  Component Value Date   VD25OH 26.45 (L) 05/14/2022   VD25OH 35.0 12/17/2021   VD25OH 10.2 (L) 09/12/2021   Lab Results  Component Value Date   WBC 11.2 (H) 11/07/2021   HGB 11.7 (L) 11/07/2021   HCT 36.2 11/07/2021   MCV 88.5 11/07/2021   PLT 272  11/07/2021   No results found for: "IRON", "TIBC", "FERRITIN"  Attestation Statements:   Reviewed by clinician on day of visit: allergies, medications, problem list, medical history, surgical history, family history, social history, and previous encounter notes.  I, Brendell Tyus, RMA, am acting as transcriptionist for Irene Limbo, FNP.  I have reviewed the above documentation for accuracy and completeness, and I agree with the above. Irene Limbo, FNP

## 2022-06-03 ENCOUNTER — Ambulatory Visit (INDEPENDENT_AMBULATORY_CARE_PROVIDER_SITE_OTHER): Payer: 59 | Admitting: Family Medicine

## 2022-06-03 ENCOUNTER — Encounter: Payer: Self-pay | Admitting: Family Medicine

## 2022-06-03 VITALS — BP 144/86 | HR 76 | Ht 62.0 in | Wt 235.4 lb

## 2022-06-03 DIAGNOSIS — Z206 Contact with and (suspected) exposure to human immunodeficiency virus [HIV]: Secondary | ICD-10-CM

## 2022-06-03 DIAGNOSIS — Z3169 Encounter for other general counseling and advice on procreation: Secondary | ICD-10-CM

## 2022-06-03 DIAGNOSIS — Z01419 Encounter for gynecological examination (general) (routine) without abnormal findings: Secondary | ICD-10-CM

## 2022-06-03 NOTE — Progress Notes (Signed)
GYNECOLOGY ANNUAL PREVENTATIVE CARE ENCOUNTER NOTE  Subjective:   Linda Mckinney is a 36 y.o. G0P0000 female here for a routine annual gynecologic exam.  Current complaints: concerned about recent unprotected sex last Tuesday. Patient desires a pregnancy and also has HIV positive partner.   They have been having sex without condoms. He usually pulls out but this last week he did not. This had led to complex emotions from [place] about whether her wants a family. He previously had not wanted one due to his HIV status.   Denies abnormal vaginal bleeding, discharge, pelvic pain, problems with intercourse or other gynecologic concerns.    Gynecologic History Patient's last menstrual period was 05/25/2022 (approximate). Contraception: coitus interruptus Last Pap: 2021. Results were: NIL/HPV neg Last mammogram: NA.   Health Maintenance Due  Topic Date Due   COVID-19 Vaccine (3 - Pfizer series) 12/10/2019    The following portions of the patient's history were reviewed and updated as appropriate: allergies, current medications, past family history, past medical history, past social history, past surgical history and problem list.  Review of Systems Pertinent items are noted in HPI.   Objective:  BP (!) 144/86   Pulse 76   Ht 5\' 2"  (1.575 m)   Wt 235 lb 6.4 oz (106.8 kg)   LMP 05/25/2022 (Approximate)   BMI 43.06 kg/m  CONSTITUTIONAL: Well-developed, well-nourished female in no acute distress.  HENT:  Normocephalic, atraumatic, External right and left ear normal. Oropharynx is clear and moist EYES:  No scleral icterus.  NECK: Normal range of motion, supple, no masses.  Normal thyroid.  SKIN: Skin is warm and dry. No rash noted. Not diaphoretic. No erythema. No pallor. NEUROLOGIC: Alert and oriented to person, place, and time. Normal reflexes, muscle tone coordination. No cranial nerve deficit noted. PSYCHIATRIC: Normal mood and affect. Normal behavior. Normal judgment and thought  content. CARDIOVASCULAR: Normal heart rate noted, regular rhythm. 2+ distal pulses. RESPIRATORY: Effort and breath sounds normal, no problems with respiration noted. BREASTS: Symmetric in size. No masses, skin changes, nipple drainage, or lymphadenopathy. ABDOMEN: Soft,  no distention noted.  No tenderness, rebound or guarding.  PELVIC: Declined MUSCULOSKELETAL: Normal range of motion.    Assessment and Plan:  1) Annual gynecologic examination - without pap:  Will follow up results of pap smear and manage accordingly. Declined STI screening.  Routine preventative health maintenance measures emphasized.  2) Contraception counseling: Patient desires pregnancy and has complex emotions in the setting of HIV positive partner (obtained from extramarital relationship)  1. Well woman exam with routine gynecological exam Up to date on pap CBE WNL Declined STI today  2. Encounter for preconception consultation Patient is planning on conceiving. We reviewed her current problems and medications in terms of pregnancy safety. Additionally discussed fertility awareness, when to take a pregnancy test, and starting a prenatal vitamin. We discussed general early pregnancy precautions. Reviewed services at the office regarding prenatal care. She voiced understanding.  Problem specific recommendations are: - HTN: if planning pregnancy recommend: Stopping HCTZ and starting Norvasc/Procardia - HIV positive partner-recommended open communication about pregnancy intention to help with clarity. Also discussed potentially starting PReP. Recommend every 3-6 month HIV testing outside of pregnancy - AMA: Patient counseled about decreasing fertility after 40 and her own medical conditions affecting her fertility as well  Provided active support as the patient became tearful discussing her HIV positive husband and her desires for a family. It is complex and I confirmed this complexity   Please refer  to After Visit  Summary for other counseling recommendations.   No follow-ups on file.  Caren Macadam, MD, MPH, ABFM Attending Physician Center for [facility]

## 2022-06-04 ENCOUNTER — Other Ambulatory Visit (HOSPITAL_COMMUNITY): Payer: Self-pay

## 2022-06-04 ENCOUNTER — Encounter: Payer: Self-pay | Admitting: Family Medicine

## 2022-06-04 DIAGNOSIS — Z2981 Encounter for HIV pre-exposure prophylaxis: Secondary | ICD-10-CM

## 2022-06-04 DIAGNOSIS — Z3169 Encounter for other general counseling and advice on procreation: Secondary | ICD-10-CM

## 2022-06-04 DIAGNOSIS — Z206 Contact with and (suspected) exposure to human immunodeficiency virus [HIV]: Secondary | ICD-10-CM

## 2022-06-10 ENCOUNTER — Encounter: Payer: Self-pay | Admitting: Family Medicine

## 2022-06-10 ENCOUNTER — Other Ambulatory Visit (HOSPITAL_COMMUNITY): Payer: Self-pay

## 2022-06-10 MED ORDER — EMTRICITABINE-TENOFOVIR DF 200-300 MG PO TABS
1.0000 | ORAL_TABLET | Freq: Every day | ORAL | 12 refills | Status: DC
  Filled 2022-06-10 – 2022-06-11 (×3): qty 30, 30d supply, fill #0
  Filled 2022-07-01: qty 30, 30d supply, fill #1
  Filled 2022-07-31: qty 30, 30d supply, fill #2
  Filled 2022-08-26: qty 30, 30d supply, fill #3

## 2022-06-11 ENCOUNTER — Other Ambulatory Visit: Payer: 59

## 2022-06-11 ENCOUNTER — Other Ambulatory Visit (HOSPITAL_COMMUNITY): Payer: Self-pay

## 2022-06-11 DIAGNOSIS — Z206 Contact with and (suspected) exposure to human immunodeficiency virus [HIV]: Secondary | ICD-10-CM

## 2022-06-11 DIAGNOSIS — Z2981 Encounter for HIV pre-exposure prophylaxis: Secondary | ICD-10-CM | POA: Diagnosis not present

## 2022-06-11 DIAGNOSIS — Z3169 Encounter for other general counseling and advice on procreation: Secondary | ICD-10-CM | POA: Diagnosis not present

## 2022-06-12 ENCOUNTER — Other Ambulatory Visit (HOSPITAL_COMMUNITY): Payer: Self-pay

## 2022-06-13 ENCOUNTER — Other Ambulatory Visit (HOSPITAL_COMMUNITY): Payer: Self-pay

## 2022-06-14 LAB — COMPREHENSIVE METABOLIC PANEL
ALT: 16 IU/L (ref 0–32)
AST: 14 IU/L (ref 0–40)
Albumin/Globulin Ratio: 1.4 (ref 1.2–2.2)
Albumin: 4.2 g/dL (ref 3.9–4.9)
Alkaline Phosphatase: 71 IU/L (ref 44–121)
BUN/Creatinine Ratio: 16 (ref 9–23)
BUN: 16 mg/dL (ref 6–20)
Bilirubin Total: 0.4 mg/dL (ref 0.0–1.2)
CO2: 25 mmol/L (ref 20–29)
Calcium: 9.2 mg/dL (ref 8.7–10.2)
Chloride: 101 mmol/L (ref 96–106)
Creatinine, Ser: 1.02 mg/dL — ABNORMAL HIGH (ref 0.57–1.00)
Globulin, Total: 3.1 g/dL (ref 1.5–4.5)
Glucose: 111 mg/dL — ABNORMAL HIGH (ref 70–99)
Potassium: 4.1 mmol/L (ref 3.5–5.2)
Sodium: 139 mmol/L (ref 134–144)
Total Protein: 7.3 g/dL (ref 6.0–8.5)
eGFR: 72 mL/min/{1.73_m2} (ref >59)

## 2022-06-14 LAB — HIV ANTIBODY (ROUTINE TESTING W REFLEX): HIV Screen 4th Generation wRfx: NONREACTIVE

## 2022-06-14 LAB — RPR: RPR Ser Ql: NONREACTIVE

## 2022-06-14 LAB — ANTI MULLERIAN HORMONE: ANTI-MULLERIAN HORMONE (AMH): 2.76 ng/mL

## 2022-06-17 DIAGNOSIS — M5441 Lumbago with sciatica, right side: Secondary | ICD-10-CM | POA: Diagnosis not present

## 2022-06-17 DIAGNOSIS — M9902 Segmental and somatic dysfunction of thoracic region: Secondary | ICD-10-CM | POA: Diagnosis not present

## 2022-06-17 DIAGNOSIS — M9903 Segmental and somatic dysfunction of lumbar region: Secondary | ICD-10-CM | POA: Diagnosis not present

## 2022-06-17 DIAGNOSIS — M546 Pain in thoracic spine: Secondary | ICD-10-CM | POA: Diagnosis not present

## 2022-06-17 DIAGNOSIS — M9905 Segmental and somatic dysfunction of pelvic region: Secondary | ICD-10-CM | POA: Diagnosis not present

## 2022-06-17 DIAGNOSIS — M955 Acquired deformity of pelvis: Secondary | ICD-10-CM | POA: Diagnosis not present

## 2022-06-24 ENCOUNTER — Encounter: Payer: Self-pay | Admitting: Family Medicine

## 2022-06-24 DIAGNOSIS — Z206 Contact with and (suspected) exposure to human immunodeficiency virus [HIV]: Secondary | ICD-10-CM

## 2022-06-25 ENCOUNTER — Other Ambulatory Visit (HOSPITAL_COMMUNITY): Payer: Self-pay

## 2022-06-26 ENCOUNTER — Encounter: Payer: Self-pay | Admitting: Nurse Practitioner

## 2022-06-26 ENCOUNTER — Ambulatory Visit (INDEPENDENT_AMBULATORY_CARE_PROVIDER_SITE_OTHER): Payer: 59 | Admitting: Nurse Practitioner

## 2022-06-26 ENCOUNTER — Other Ambulatory Visit: Payer: Self-pay | Admitting: *Deleted

## 2022-06-26 ENCOUNTER — Other Ambulatory Visit (HOSPITAL_COMMUNITY): Payer: Self-pay

## 2022-06-26 ENCOUNTER — Other Ambulatory Visit: Payer: Self-pay

## 2022-06-26 VITALS — BP 134/84 | HR 72 | Temp 98.1°F | Ht 62.0 in | Wt 227.0 lb

## 2022-06-26 DIAGNOSIS — E559 Vitamin D deficiency, unspecified: Secondary | ICD-10-CM | POA: Diagnosis not present

## 2022-06-26 DIAGNOSIS — E669 Obesity, unspecified: Secondary | ICD-10-CM

## 2022-06-26 DIAGNOSIS — R638 Other symptoms and signs concerning food and fluid intake: Secondary | ICD-10-CM | POA: Diagnosis not present

## 2022-06-26 DIAGNOSIS — Z6841 Body Mass Index (BMI) 40.0 and over, adult: Secondary | ICD-10-CM

## 2022-06-26 DIAGNOSIS — Z206 Contact with and (suspected) exposure to human immunodeficiency virus [HIV]: Secondary | ICD-10-CM

## 2022-06-26 HISTORY — DX: Contact with and (suspected) exposure to human immunodeficiency virus (hiv): Z20.6

## 2022-06-26 MED ORDER — ONDANSETRON 4 MG PO TBDP
4.0000 mg | ORAL_TABLET | Freq: Four times a day (QID) | ORAL | 0 refills | Status: DC | PRN
  Filled 2022-06-26 (×2): qty 20, 5d supply, fill #0

## 2022-06-26 MED ORDER — VITAMIN D (ERGOCALCIFEROL) 1.25 MG (50000 UNIT) PO CAPS
50000.0000 [IU] | ORAL_CAPSULE | ORAL | 0 refills | Status: DC
  Filled 2022-06-26: qty 5, 35d supply, fill #0

## 2022-06-26 MED ORDER — BUPROPION HCL ER (SR) 150 MG PO TB12
150.0000 mg | ORAL_TABLET | Freq: Two times a day (BID) | ORAL | 0 refills | Status: DC
  Filled 2022-06-26: qty 60, 30d supply, fill #0

## 2022-06-27 ENCOUNTER — Encounter (HOSPITAL_COMMUNITY): Payer: Self-pay

## 2022-06-27 ENCOUNTER — Other Ambulatory Visit (HOSPITAL_COMMUNITY): Payer: Self-pay

## 2022-07-01 ENCOUNTER — Other Ambulatory Visit (HOSPITAL_COMMUNITY): Payer: Self-pay

## 2022-07-09 ENCOUNTER — Other Ambulatory Visit (HOSPITAL_COMMUNITY): Payer: Self-pay

## 2022-07-09 NOTE — Progress Notes (Unsigned)
Chief Complaint:   OBESITY Linda Mckinney is here to discuss her progress with her obesity treatment plan along with follow-up of her obesity related diagnoses. Linda Mckinney is on the Category 2 Plan and states she is following her eating plan approximately 65% of the time. Linda Mckinney states she is exercising 0 minutes 0 times per week.  Today's visit was #: 13 Starting weight: 245 lbs Starting date: 09/12/2021 Today's weight: 227 lbs Today's date: 06/26/2022 Total lbs lost to date: 18 lbs Total lbs lost since last in-office visit: 0  Interim History: Linda Mckinney started a new medication since her last visit and feels this has contributed to her recent weight gain. May consider pregnancy soon and has questions today about Zebound.  Subjective:   1. Vitamin D deficiency Linda Mckinney is currently taking prescription Vit D 50,000 IU once a week. Denies any nausea, vomiting or muscle weakness.  2. Abnormal craving Linda Mckinney is taking Wellbutrin SR 150 mg twice a day. Considering trying to get pregnant in the future. Saw ob recently and discussed continuation of Wellbutrin.  Assessment/Plan:   1. Vitamin D deficiency We will refill Vit D 50K IU once a week for 1 month with 0 refills. Dose decreased. Side effects discussed.   -Refill Vitamin D, Ergocalciferol, (DRISDOL) 1.25 MG (50000 UNIT) CAPS capsule; Take 1 capsule (50,000 Units total) by mouth every 7 (seven) days.  Dispense: 5 capsule; Refill: 0  2. Abnormal craving We will refill Wellbutrin SR 150 mg twice a day for 1 month with 0 refills. Continue to follow up with OBGYN.  -Refill buPROPion (WELLBUTRIN SR) 150 MG 12 hr tablet; Take 1 tablet (150 mg total) by mouth 2 (two) times daily.  Dispense: 60 tablet; Refill: 0  3. Obesity, current BMI 41.5 Linda Mckinney is currently in the action stage of change. As such, her goal is to continue with weight loss efforts. She has agreed to the Category 2 Plan.   Exercise goals: All adults should avoid inactivity.  Some physical activity is better than none, and adults who participate in any amount of physical activity gain some health benefits.  Will discuss plan of care at next visit.  Patient to discuss future plans of possible pregnancy with her husband.   Behavioral modification strategies: increasing lean protein intake, increasing water intake, and holiday eating strategies .  Linda Mckinney has agreed to follow-up with our clinic in 4 weeks. She was informed of the importance of frequent follow-up visits to maximize her success with intensive lifestyle modifications for her multiple health conditions.   Objective:   Blood pressure 134/84, pulse 72, temperature 98.1 F (36.7 C), temperature source Oral, height 5\' 2"  (1.575 m), weight 227 lb (103 kg), last menstrual period 05/25/2022, SpO2 99 %. Body mass index is 41.52 kg/m.  General: Cooperative, alert, well developed, in no acute distress. HEENT: Conjunctivae and lids unremarkable. Cardiovascular: Regular rhythm.  Lungs: Normal work of breathing. Neurologic: No focal deficits.   Lab Results  Component Value Date   CREATININE 1.02 (H) 06/11/2022   BUN 16 06/11/2022   NA 139 06/11/2022   K 4.1 06/11/2022   CL 101 06/11/2022   CO2 25 06/11/2022   Lab Results  Component Value Date   ALT 16 06/11/2022   AST 14 06/11/2022   ALKPHOS 71 06/11/2022   BILITOT 0.4 06/11/2022   Lab Results  Component Value Date   HGBA1C 6.0 05/14/2022   HGBA1C 5.7 (H) 12/17/2021   HGBA1C 6.1 (H) 09/12/2021   HGBA1C 6.1  05/09/2021   HGBA1C 6.1 11/08/2020   Lab Results  Component Value Date   INSULIN 12.2 12/17/2021   INSULIN 29.5 (H) 09/12/2021   Lab Results  Component Value Date   TSH 1.140 09/12/2021   Lab Results  Component Value Date   CHOL 157 05/14/2022   HDL 48.60 05/14/2022   LDLCALC 100 (H) 05/14/2022   TRIG 40.0 05/14/2022   CHOLHDL 3 05/14/2022   Lab Results  Component Value Date   VD25OH 26.45 (L) 05/14/2022   VD25OH 35.0  12/17/2021   VD25OH 10.2 (L) 09/12/2021   Lab Results  Component Value Date   WBC 11.2 (H) 11/07/2021   HGB 11.7 (L) 11/07/2021   HCT 36.2 11/07/2021   MCV 88.5 11/07/2021   PLT 272 11/07/2021   No results found for: "IRON", "TIBC", "FERRITIN"  Attestation Statements:   Reviewed by clinician on day of visit: allergies, medications, problem list, medical history, surgical history, family history, social history, and previous encounter notes.  I, Brendell Tyus, RMA, am acting as transcriptionist for Irene Limbo, FNP.  I have reviewed the above documentation for accuracy and completeness, and I agree with the above. Irene Limbo, FNP

## 2022-07-10 ENCOUNTER — Other Ambulatory Visit (HOSPITAL_COMMUNITY): Payer: Self-pay

## 2022-07-12 ENCOUNTER — Other Ambulatory Visit: Payer: 59

## 2022-07-23 ENCOUNTER — Ambulatory Visit: Payer: BC Managed Care – PPO | Admitting: Licensed Clinical Social Worker

## 2022-07-25 ENCOUNTER — Other Ambulatory Visit: Payer: Self-pay | Admitting: Primary Care

## 2022-07-25 ENCOUNTER — Other Ambulatory Visit: Payer: Self-pay

## 2022-07-25 ENCOUNTER — Other Ambulatory Visit (HOSPITAL_COMMUNITY): Payer: Self-pay

## 2022-07-25 DIAGNOSIS — I10 Essential (primary) hypertension: Secondary | ICD-10-CM

## 2022-07-25 MED ORDER — HYDROCHLOROTHIAZIDE 12.5 MG PO TABS
12.5000 mg | ORAL_TABLET | Freq: Every day | ORAL | 2 refills | Status: DC
  Filled 2022-07-25: qty 90, 90d supply, fill #0
  Filled 2022-11-04: qty 90, 90d supply, fill #1
  Filled 2023-02-10: qty 90, 90d supply, fill #2

## 2022-07-25 NOTE — BH Specialist Note (Signed)
Linda Mckinney reports she does not need IBH visit

## 2022-07-29 ENCOUNTER — Other Ambulatory Visit (HOSPITAL_COMMUNITY): Payer: Self-pay

## 2022-07-30 ENCOUNTER — Ambulatory Visit: Payer: BC Managed Care – PPO | Admitting: Nurse Practitioner

## 2022-07-31 ENCOUNTER — Other Ambulatory Visit (HOSPITAL_COMMUNITY): Payer: Self-pay

## 2022-08-02 ENCOUNTER — Other Ambulatory Visit (HOSPITAL_COMMUNITY): Payer: Self-pay

## 2022-08-12 DIAGNOSIS — M5441 Lumbago with sciatica, right side: Secondary | ICD-10-CM | POA: Diagnosis not present

## 2022-08-12 DIAGNOSIS — M546 Pain in thoracic spine: Secondary | ICD-10-CM | POA: Diagnosis not present

## 2022-08-12 DIAGNOSIS — M9905 Segmental and somatic dysfunction of pelvic region: Secondary | ICD-10-CM | POA: Diagnosis not present

## 2022-08-12 DIAGNOSIS — M955 Acquired deformity of pelvis: Secondary | ICD-10-CM | POA: Diagnosis not present

## 2022-08-12 DIAGNOSIS — M9903 Segmental and somatic dysfunction of lumbar region: Secondary | ICD-10-CM | POA: Diagnosis not present

## 2022-08-12 DIAGNOSIS — M9902 Segmental and somatic dysfunction of thoracic region: Secondary | ICD-10-CM | POA: Diagnosis not present

## 2022-08-13 ENCOUNTER — Other Ambulatory Visit (HOSPITAL_COMMUNITY): Payer: Self-pay

## 2022-08-13 ENCOUNTER — Ambulatory Visit (INDEPENDENT_AMBULATORY_CARE_PROVIDER_SITE_OTHER): Payer: 59 | Admitting: Nurse Practitioner

## 2022-08-13 ENCOUNTER — Encounter: Payer: Self-pay | Admitting: Nurse Practitioner

## 2022-08-13 VITALS — BP 138/83 | HR 82 | Temp 98.8°F | Ht 62.0 in | Wt 223.0 lb

## 2022-08-13 DIAGNOSIS — E559 Vitamin D deficiency, unspecified: Secondary | ICD-10-CM | POA: Diagnosis not present

## 2022-08-13 DIAGNOSIS — Z6841 Body Mass Index (BMI) 40.0 and over, adult: Secondary | ICD-10-CM | POA: Diagnosis not present

## 2022-08-13 DIAGNOSIS — R638 Other symptoms and signs concerning food and fluid intake: Secondary | ICD-10-CM | POA: Diagnosis not present

## 2022-08-13 MED ORDER — ZEPBOUND 2.5 MG/0.5ML ~~LOC~~ SOAJ
2.5000 mg | SUBCUTANEOUS | 0 refills | Status: DC
  Filled 2022-08-13 – 2022-08-26 (×2): qty 2, 28d supply, fill #0

## 2022-08-13 MED ORDER — VITAMIN D (ERGOCALCIFEROL) 1.25 MG (50000 UNIT) PO CAPS
50000.0000 [IU] | ORAL_CAPSULE | ORAL | 0 refills | Status: DC
  Filled 2022-08-13 – 2022-08-16 (×2): qty 5, 35d supply, fill #0

## 2022-08-13 MED ORDER — BUPROPION HCL ER (XL) 300 MG PO TB24
300.0000 mg | ORAL_TABLET | Freq: Every day | ORAL | 0 refills | Status: DC
  Filled 2022-08-13 – 2022-08-16 (×2): qty 30, 30d supply, fill #0

## 2022-08-13 NOTE — Patient Instructions (Signed)

## 2022-08-16 ENCOUNTER — Other Ambulatory Visit (HOSPITAL_COMMUNITY): Payer: Self-pay

## 2022-08-16 ENCOUNTER — Other Ambulatory Visit: Payer: Self-pay

## 2022-08-19 ENCOUNTER — Other Ambulatory Visit (HOSPITAL_COMMUNITY): Payer: Self-pay

## 2022-08-20 ENCOUNTER — Other Ambulatory Visit: Payer: Self-pay

## 2022-08-21 ENCOUNTER — Encounter: Payer: Self-pay | Admitting: Nurse Practitioner

## 2022-08-21 ENCOUNTER — Telehealth: Payer: Self-pay

## 2022-08-21 NOTE — Telephone Encounter (Signed)
PA submitted through Cover [employer] Meds for Zepbound. Awaiting insurance determination.  KeyJuanda Bond

## 2022-08-22 NOTE — Progress Notes (Signed)
Chief Complaint:   OBESITY Linda Mckinney is here to discuss her progress with her obesity treatment plan along with follow-up of her obesity related diagnoses. Linda Mckinney is on the Category 2 Plan and states she is following her eating plan approximately 60% of the time. Linda Mckinney states she is exercising 0 minutes 0 times per week.  Today's visit was #: 14 Starting weight: 245 lbs Starting date: 09/12/2021 Today's weight: 223 lbs Today's date: 08/13/2022 Total lbs lost to date: 22 lbs Total lbs lost since last in-office visit: 4  Interim History: Linda Mckinney has been sick on and off since her last visit.  Has restarted following the meal plan last week.  She is trying to drink more water.  Subjective:   1. Abnormal craving Taking Wellbutrin SR 150 mg daily.  Feels it helps with cravings.  Requesting once a day dose.  2. Vitamin D deficiency Linda Mckinney is currently taking prescription Vit D 50,000 IU once a week.   Denies any nausea, vomiting or muscle weakness.  Assessment/Plan:   1. Abnormal craving Stop Wellbutrin SR 150 mg Change to Wellbutrin XL 350 mg daily for 1 month with 0 refills per patient request.   Side effects discussed.   -Change/refill buPROPion (WELLBUTRIN XL) 300 MG 24 hr tablet; Take 1 tablet (300 mg total) by mouth daily.  Dispense: 30 tablet; Refill: 0  2. Vitamin D deficiency We will refill Vit D 50K IU once a week for 1 month with 0 refills.  Side effects discussed.   -Refill Vitamin D, Ergocalciferol, (DRISDOL) 1.25 MG (50000 UNIT) CAPS capsule; Take 1 capsule (50,000 Units total) by mouth every 7 (seven) days.  Dispense: 5 capsule; Refill: 0  3. Morbid obesity ([address])  4. BMI 40.0-44.9, adult (HCC) Start Zepbound 2.5 mg SQ once a week for 1 month with 0 refills.  Side effects discussed.   -Start tirzepatide (ZEPBOUND) 2.5 MG/0.5ML Pen; Inject 2.5 mg into the skin once a week.  Dispense: 2 mL; Refill: 0  Linda Mckinney is currently in the action stage of change. As such,  her goal is to continue with weight loss efforts. She has agreed to the Category 2 Plan.   Exercise goals: All adults should avoid inactivity. Some physical activity is better than none, and adults who participate in any amount of physical activity gain some health benefits.  Behavioral modification strategies: increasing lean protein intake, increasing vegetables, and increasing water intake.  Linda Mckinney has agreed to follow-up with our clinic in 4 weeks. She was informed of the importance of frequent follow-up visits to maximize her success with intensive lifestyle modifications for her multiple health conditions.   Objective:   Blood pressure 138/83, pulse 82, temperature 98.8 F (37.1 C), height 5\' 2"  (1.575 m), weight 223 lb (101.2 kg), last menstrual period 07/26/2022, SpO2 100 %. Body mass index is 40.79 kg/m.  General: Cooperative, alert, well developed, in no acute distress. HEENT: Conjunctivae and lids unremarkable. Cardiovascular: Regular rhythm.  Lungs: Normal work of breathing. Neurologic: No focal deficits.   Lab Results  Component Value Date   CREATININE 1.02 (H) 06/11/2022   BUN 16 06/11/2022   NA 139 06/11/2022   K 4.1 06/11/2022   CL 101 06/11/2022   CO2 25 06/11/2022   Lab Results  Component Value Date   ALT 16 06/11/2022   AST 14 06/11/2022   ALKPHOS 71 06/11/2022   BILITOT 0.4 06/11/2022   Lab Results  Component Value Date   HGBA1C 6.0 05/14/2022  HGBA1C 5.7 (H) 12/17/2021   HGBA1C 6.1 (H) 09/12/2021   HGBA1C 6.1 05/09/2021   HGBA1C 6.1 11/08/2020   Lab Results  Component Value Date   INSULIN 12.2 12/17/2021   INSULIN 29.5 (H) 09/12/2021   Lab Results  Component Value Date   TSH 1.140 09/12/2021   Lab Results  Component Value Date   CHOL 157 05/14/2022   HDL 48.60 05/14/2022   LDLCALC 100 (H) 05/14/2022   TRIG 40.0 05/14/2022   CHOLHDL 3 05/14/2022   Lab Results  Component Value Date   VD25OH 26.45 (L) 05/14/2022   VD25OH 35.0  12/17/2021   VD25OH 10.2 (L) 09/12/2021   Lab Results  Component Value Date   WBC 11.2 (H) 11/07/2021   HGB 11.7 (L) 11/07/2021   HCT 36.2 11/07/2021   MCV 88.5 11/07/2021   PLT 272 11/07/2021   No results found for: "IRON", "TIBC", "FERRITIN"  Attestation Statements:   Reviewed by clinician on day of visit: allergies, medications, problem list, medical history, surgical history, family history, social history, and previous encounter notes.  I, Brendell Tyus, RMA, am acting as transcriptionist for Everardo Pacific, FNP.  I have reviewed the above documentation for accuracy and completeness, and I agree with the above. Everardo Pacific, FNP

## 2022-08-23 ENCOUNTER — Other Ambulatory Visit (HOSPITAL_COMMUNITY): Payer: Self-pay

## 2022-08-26 ENCOUNTER — Other Ambulatory Visit (HOSPITAL_COMMUNITY): Payer: Self-pay

## 2022-08-26 NOTE — Telephone Encounter (Signed)
Received fax from [employer]. Zepbound approved from 08/23/22-11/21/22. [employer] message sent to patient.

## 2022-09-02 ENCOUNTER — Other Ambulatory Visit: Payer: Self-pay

## 2022-09-03 ENCOUNTER — Other Ambulatory Visit: Payer: Self-pay

## 2022-09-09 DIAGNOSIS — M546 Pain in thoracic spine: Secondary | ICD-10-CM | POA: Diagnosis not present

## 2022-09-09 DIAGNOSIS — M955 Acquired deformity of pelvis: Secondary | ICD-10-CM | POA: Diagnosis not present

## 2022-09-09 DIAGNOSIS — M9905 Segmental and somatic dysfunction of pelvic region: Secondary | ICD-10-CM | POA: Diagnosis not present

## 2022-09-09 DIAGNOSIS — M9903 Segmental and somatic dysfunction of lumbar region: Secondary | ICD-10-CM | POA: Diagnosis not present

## 2022-09-09 DIAGNOSIS — M5441 Lumbago with sciatica, right side: Secondary | ICD-10-CM | POA: Diagnosis not present

## 2022-09-09 DIAGNOSIS — M9902 Segmental and somatic dysfunction of thoracic region: Secondary | ICD-10-CM | POA: Diagnosis not present

## 2022-09-12 ENCOUNTER — Other Ambulatory Visit: Payer: Self-pay

## 2022-09-12 ENCOUNTER — Ambulatory Visit (INDEPENDENT_AMBULATORY_CARE_PROVIDER_SITE_OTHER): Payer: 59 | Admitting: Nurse Practitioner

## 2022-09-12 ENCOUNTER — Other Ambulatory Visit (HOSPITAL_COMMUNITY): Payer: Self-pay

## 2022-09-12 ENCOUNTER — Encounter: Payer: Self-pay | Admitting: Nurse Practitioner

## 2022-09-12 VITALS — BP 126/80 | HR 77 | Temp 98.2°F | Ht 62.0 in | Wt 218.0 lb

## 2022-09-12 DIAGNOSIS — R638 Other symptoms and signs concerning food and fluid intake: Secondary | ICD-10-CM

## 2022-09-12 DIAGNOSIS — Z6841 Body Mass Index (BMI) 40.0 and over, adult: Secondary | ICD-10-CM

## 2022-09-12 MED ORDER — ZEPBOUND 2.5 MG/0.5ML ~~LOC~~ SOAJ
2.5000 mg | SUBCUTANEOUS | 0 refills | Status: DC
  Filled 2022-09-12 – 2022-09-19 (×3): qty 2, 28d supply, fill #0

## 2022-09-12 MED ORDER — BUPROPION HCL ER (XL) 300 MG PO TB24
300.0000 mg | ORAL_TABLET | Freq: Every day | ORAL | 0 refills | Status: DC
  Filled 2022-09-12: qty 30, 30d supply, fill #0

## 2022-09-12 NOTE — Progress Notes (Signed)
Office: [phone]  /  Fax: [phone]  WEIGHT SUMMARY AND BIOMETRICS  Weight Lost Since Last Visit: 5lb  No data recorded  Vitals Temp: 98.2 F (36.8 C) BP: 126/80 Pulse Rate: 77 SpO2: 100 %   Anthropometric Measurements Height: '5\' 2"'$  (1.575 m) Weight: 218 lb (98.9 kg) BMI (Calculated): 39.86 Weight at Last Visit: 223lb Weight Lost Since Last Visit: 5lb Starting Weight: 245lb Total Weight Loss (lbs): 27 lb (12.2 kg)   Body Composition  Body Fat %: 43.2 % Fat Mass (lbs): 94.6 lbs Muscle Mass (lbs): 118 lbs Total Body Water (lbs): 82.8 lbs Visceral Fat Rating : 11   Other Clinical Data Today's Visit #: 15 Starting Date: 09/12/21     HPI  Chief Complaint: OBESITY  Linda Mckinney is here to discuss her progress with her obesity treatment plan. She is on the the Category 2 Plan and states she is following her eating plan approximately 90 % of the time. She states she is exercising 0 minutes 0 days per week.   Interval History:  Since last office visit she has lost 5 lbs.  She is not skipping meals.  She is meeting her protein and calories goals.  She is meal prepping. She is drinking water daily.     Pharmacotherapy for weight loss: She is currently taking Zepbound 2.5 (approved till 11/21/22) for medical weight loss.  Initially had side effects of headaches but that has subsided.     Previous pharmacotherapy for medical weight loss:   None     Abnormal cravings Last visit changed from Wellbutrin SR to Wellbutrin XL '300mg'$ .  Denies side effects.  Has helped with cravings. Doesn't crave sweets like she did in the past.     PHYSICAL EXAM:  Blood pressure 126/80, pulse 77, temperature 98.2 F (36.8 C), height '5\' 2"'$  (1.575 m), weight 218 lb (98.9 kg), last menstrual period 07/26/2022, SpO2 100 %. Body mass index is 39.87 kg/m.  General: She is overweight, cooperative, alert, well developed, and in no acute distress. PSYCH: Has normal mood, affect and thought  process.   Extremities: No edema.  Neurologic: No gross sensory or motor deficits. No tremors or fasciculations noted.    DIAGNOSTIC DATA REVIEWED:  BMET    Component Value Date/Time   NA 139 06/11/2022 0906   K 4.1 06/11/2022 0906   CL 101 06/11/2022 0906   CO2 25 06/11/2022 0906   GLUCOSE 111 (H) 06/11/2022 0906   GLUCOSE 93 05/14/2022 1119   BUN 16 06/11/2022 0906   CREATININE 1.02 (H) 06/11/2022 0906   CALCIUM 9.2 06/11/2022 0906   GFRNONAA >60 01/24/2021 1449   Lab Results  Component Value Date   HGBA1C 6.0 05/14/2022   HGBA1C 5.7 01/15/2016   Lab Results  Component Value Date   INSULIN 12.2 12/17/2021   INSULIN 29.5 (H) 09/12/2021   Lab Results  Component Value Date   TSH 1.140 09/12/2021   CBC    Component Value Date/Time   WBC 11.2 (H) 11/07/2021 1843   RBC 4.09 11/07/2021 1843   HGB 11.7 (L) 11/07/2021 1843   HCT 36.2 11/07/2021 1843   PLT 272 11/07/2021 1843   MCV 88.5 11/07/2021 1843   MCH 28.6 11/07/2021 1843   MCHC 32.3 11/07/2021 1843   RDW 12.5 11/07/2021 1843   Iron Studies No results found for: "IRON", "TIBC", "FERRITIN", "IRONPCTSAT" Lipid Panel     Component Value Date/Time   CHOL 157 05/14/2022 1119   TRIG 40.0 05/14/2022 1119  HDL 48.60 05/14/2022 1119   CHOLHDL 3 05/14/2022 1119   VLDL 8.0 05/14/2022 1119   LDLCALC 100 (H) 05/14/2022 1119   Hepatic Function Panel     Component Value Date/Time   PROT 7.3 06/11/2022 0906   ALBUMIN 4.2 06/11/2022 0906   AST 14 06/11/2022 0906   ALT 16 06/11/2022 0906   ALKPHOS 71 06/11/2022 0906   BILITOT 0.4 06/11/2022 0906      Component Value Date/Time   TSH 1.140 09/12/2021 1007   Nutritional Lab Results  Component Value Date   VD25OH 26.45 (L) 05/14/2022   VD25OH 35.0 12/17/2021   VD25OH 10.2 (L) 09/12/2021     ASSESSMENT AND PLAN  TREATMENT PLAN FOR OBESITY:  Recommended Dietary Goals  Linda Mckinney is currently in the action stage of change. As such, her goal is to continue  weight management plan. She has agreed to the Category 2 Plan.  Behavioral Intervention  We discussed the following Behavioral Modification Strategies today: increasing lean protein intake, decreasing simple carbohydrates , increasing vegetables, avoiding skipping meals, increasing water intake, and work on meal planning and easy cooking plans.  Additional resources provided today: NA  Recommended Physical Activity Goals  Linda Mckinney has been advised to work up to 150 minutes of moderate intensity aerobic activity a week and strengthening exercises 2-3 times per week for cardiovascular health, weight loss maintenance and preservation of muscle mass.   She has agreed to increase physical activity in their day and reduce sedentary time (increase NEAT).  and Patient also encouraged on scheduling and tracking physical activity.    Pharmacotherapy We discussed various medication options to help Linda Mckinney with her weight loss efforts and we both agreed to continue Zepbound 2.'5mg'$  and Wellbutrin XL '300mg'$ .  Information given on Contrave and Qsymia.   ASSOCIATED CONDITIONS ADDRESSED TODAY  Action/Plan  Abnormal craving -     buPROPion HCl ER (XL); Take 1 tablet (300 mg total) by mouth daily.  Dispense: 30 tablet; Refill: 0 Doing well. Will continue to monitor.    Morbid obesity ([place]) -     Zepbound; Inject 2.5 mg into the skin once a week.  Dispense: 2 mL; Refill: 0. Side effects discussed.   Patient knows not to get pregnant while taking Zepbound.    BMI 40.0-44.9, adult ([place])      Return in about 3 weeks (around 10/03/2022).Marland Kitchen She was informed of the importance of frequent follow up visits to maximize her success with intensive lifestyle modifications for her multiple health conditions.   ATTESTASTION STATEMENTS:  Reviewed by clinician on day of visit: allergies, medications, problem list, medical history, surgical history, family history, social history, and previous encounter notes.      Ailene Rud. Nyemah Watton FNP-C

## 2022-09-15 ENCOUNTER — Other Ambulatory Visit (HOSPITAL_COMMUNITY): Payer: Self-pay

## 2022-09-19 ENCOUNTER — Other Ambulatory Visit: Payer: Self-pay

## 2022-09-19 ENCOUNTER — Other Ambulatory Visit (HOSPITAL_COMMUNITY): Payer: Self-pay

## 2022-09-26 ENCOUNTER — Other Ambulatory Visit (HOSPITAL_COMMUNITY): Payer: Self-pay

## 2022-09-30 ENCOUNTER — Other Ambulatory Visit (HOSPITAL_COMMUNITY): Payer: Self-pay

## 2022-10-01 ENCOUNTER — Encounter: Payer: Self-pay | Admitting: Nurse Practitioner

## 2022-10-01 ENCOUNTER — Other Ambulatory Visit (HOSPITAL_COMMUNITY): Payer: Self-pay

## 2022-10-01 ENCOUNTER — Ambulatory Visit (INDEPENDENT_AMBULATORY_CARE_PROVIDER_SITE_OTHER): Payer: 59 | Admitting: Nurse Practitioner

## 2022-10-01 ENCOUNTER — Other Ambulatory Visit: Payer: Self-pay

## 2022-10-01 VITALS — BP 130/84 | HR 77 | Temp 98.3°F | Ht 62.0 in | Wt 219.0 lb

## 2022-10-01 DIAGNOSIS — Z6841 Body Mass Index (BMI) 40.0 and over, adult: Secondary | ICD-10-CM

## 2022-10-01 DIAGNOSIS — E559 Vitamin D deficiency, unspecified: Secondary | ICD-10-CM | POA: Diagnosis not present

## 2022-10-01 MED ORDER — VITAMIN D (ERGOCALCIFEROL) 1.25 MG (50000 UNIT) PO CAPS
50000.0000 [IU] | ORAL_CAPSULE | ORAL | 0 refills | Status: DC
  Filled 2022-10-01: qty 5, 35d supply, fill #0

## 2022-10-01 MED ORDER — ZEPBOUND 5 MG/0.5ML ~~LOC~~ SOAJ
5.0000 mg | SUBCUTANEOUS | 0 refills | Status: DC
  Filled 2022-10-01: qty 6, 84d supply, fill #0
  Filled 2022-10-01 – 2022-10-12 (×2): qty 2, 28d supply, fill #0

## 2022-10-01 NOTE — Progress Notes (Signed)
Office: [phone]  /  Fax: [phone]  WEIGHT SUMMARY AND BIOMETRICS  No data recorded Weight Gained Since Last Visit: 1lb   Vitals Temp: 98.3 F (36.8 C) BP: 130/84 Pulse Rate: 77 SpO2: 99 %   Anthropometric Measurements Height: 5\' 2"  (1.575 m) Weight: 219 lb (99.3 kg) BMI (Calculated): 40.05 Weight at Last Visit: 218lb Weight Gained Since Last Visit: 1lb Starting Weight: 245lb Total Weight Loss (lbs): 26 lb (11.8 kg)   Body Composition  Body Fat %: 45.8 % Fat Mass (lbs): 100.8 lbs Muscle Mass (lbs): 113.2 lbs Total Body Water (lbs): 88.4 lbs Visceral Fat Rating : 12   Other Clinical Data Fasting: Yes Today's Visit #: 16 Starting Date: 09/12/21     HPI  Chief Complaint: OBESITY  Linda Mckinney is here to discuss her progress with her obesity treatment plan. She is on the the Category 2 Plan and states she is following her eating plan approximately 70 % of the time. She states she is exercising 0 minutes 0 days per week.   Interval History:  Since last office visit she gained 1 pound. She reports she has been meal prepping for lunch.  She is not sure why she gained weight.  She is making healthier choices and watching her portion sizes.  She does find that sometimes she forgets to eat due to her job. 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  She is excited because her pants and scrubs are looser and fitting better.  She is losing inches.  She just purchased size large in her scrubs when prior she was wearing XL to 2X.     Pharmacotherapy for weight loss: She is currently taking Zepbound 2.5 mg for medical weight loss.  Denies side effects.    Previous pharmacotherapy for medical weight loss:  None.  Bariatric surgery:  never had bariatric surgery.    Vit D deficiency  She is taking Vit D 50,000 IU weekly.  Denies side effects.  Denies nausea, vomiting or muscle weakness.    Lab Results  Component Value Date   VD25OH 26.45 (L) 05/14/2022    VD25OH 35.0 12/17/2021   VD25OH 10.2 (L) 09/12/2021     PHYSICAL EXAM:  Blood pressure 130/84, pulse 77, temperature 98.3 F (36.8 C), height 5\' 2"  (1.575 m), weight 219 lb (99.3 kg), last menstrual period 09/23/2022, SpO2 99 %. Body mass index is 40.06 kg/m.  General: She is overweight, cooperative, alert, well developed, and in no acute distress. PSYCH: Has normal mood, affect and thought process.   Extremities: No edema.  Neurologic: No gross sensory or motor deficits. No tremors or fasciculations noted.    DIAGNOSTIC DATA REVIEWED:  BMET    Component Value Date/Time   NA 139 06/11/2022 0906   K 4.1 06/11/2022 0906   CL 101 06/11/2022 0906   CO2 25 06/11/2022 0906   GLUCOSE 111 (H) 06/11/2022 0906   GLUCOSE 93 05/14/2022 1119   BUN 16 06/11/2022 0906   CREATININE 1.02 (H) 06/11/2022 0906   CALCIUM 9.2 06/11/2022 0906   GFRNONAA >60 01/24/2021 1449   Lab Results  Component Value Date   HGBA1C 6.0 05/14/2022   HGBA1C 5.7 01/15/2016   Lab Results  Component Value Date   INSULIN 12.2 12/17/2021   INSULIN 29.5 (H) 09/12/2021   Lab Results  Component Value Date   TSH 1.140 09/12/2021   CBC    Component Value Date/Time   WBC 11.2 (H) 11/07/2021 1843   RBC  4.09 11/07/2021 1843   HGB 11.7 (L) 11/07/2021 1843   HCT 36.2 11/07/2021 1843   PLT 272 11/07/2021 1843   MCV 88.5 11/07/2021 1843   MCH 28.6 11/07/2021 1843   MCHC 32.3 11/07/2021 1843   RDW 12.5 11/07/2021 1843   Iron Studies No results found for: "IRON", "TIBC", "FERRITIN", "IRONPCTSAT" Lipid Panel     Component Value Date/Time   CHOL 157 05/14/2022 1119   TRIG 40.0 05/14/2022 1119   HDL 48.60 05/14/2022 1119   CHOLHDL 3 05/14/2022 1119   VLDL 8.0 05/14/2022 1119   LDLCALC 100 (H) 05/14/2022 1119   Hepatic Function Panel     Component Value Date/Time   PROT 7.3 06/11/2022 0906   ALBUMIN 4.2 06/11/2022 0906   AST 14 06/11/2022 0906   ALT 16 06/11/2022 0906   ALKPHOS 71 06/11/2022 0906    BILITOT 0.4 06/11/2022 0906      Component Value Date/Time   TSH 1.140 09/12/2021 1007   Nutritional Lab Results  Component Value Date   VD25OH 26.45 (L) 05/14/2022   VD25OH 35.0 12/17/2021   VD25OH 10.2 (L) 09/12/2021     ASSESSMENT AND PLAN  TREATMENT PLAN FOR OBESITY:  Recommended Dietary Goals  Thiana is currently in the action stage of change. As such, her goal is to continue weight management plan. She has agreed to the Category 2 Plan.  Behavioral Intervention  We discussed the following Behavioral Modification Strategies today: increasing lean protein intake, decreasing simple carbohydrates , increasing vegetables, avoiding skipping meals, increasing water intake, and work on meal planning and easy cooking plans.  Additional resources provided today: NA  Recommended Physical Activity Goals  Devondra has been advised to work up to 150 minutes of moderate intensity aerobic activity a week and strengthening exercises 2-3 times per week for cardiovascular health, weight loss maintenance and preservation of muscle mass.   She has agreed to Think about ways to increase physical activity and Work on scheduling and tracking physical activity.    Pharmacotherapy We discussed various medication options to help Anyjah with her weight loss efforts and we both agreed to increase Zepbound 5mg .  Side effects discussed..  ASSOCIATED CONDITIONS ADDRESSED TODAY  Action/Plan  Vitamin D deficiency -     Vitamin D (Ergocalciferol); Take 1 capsule (50,000 Units total) by mouth every 7 (seven) days.  Dispense: 5 capsule; Refill: 0  Morbid obesity ([place]) -     Zepbound; Inject 5 mg into the skin once a week.  Dispense: 6 mL; Refill: 0  BMI 40.0-44.9, adult ([place]) -     Zepbound; Inject 5 mg into the skin once a week.  Dispense: 6 mL; Refill: 0     Will obtain labs at next visit.   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    Return in about 4  weeks (around 10/29/2022).Marland Kitchen She was informed of the importance of frequent follow up visits to maximize her success with intensive lifestyle modifications for her multiple health conditions.   ATTESTASTION STATEMENTS:  Reviewed by clinician on day of visit: allergies, medications, problem list, medical history, surgical history, family history, social history, and previous encounter notes.     Ailene Rud. Aurther Harlin FNP-C

## 2022-10-01 NOTE — Patient Instructions (Signed)

## 2022-10-02 ENCOUNTER — Other Ambulatory Visit: Payer: Self-pay

## 2022-10-02 ENCOUNTER — Other Ambulatory Visit (HOSPITAL_COMMUNITY): Payer: Self-pay

## 2022-10-09 ENCOUNTER — Other Ambulatory Visit (HOSPITAL_COMMUNITY): Payer: Self-pay

## 2022-10-12 ENCOUNTER — Other Ambulatory Visit: Payer: Self-pay | Admitting: Nurse Practitioner

## 2022-10-14 ENCOUNTER — Other Ambulatory Visit: Payer: Self-pay

## 2022-10-14 ENCOUNTER — Other Ambulatory Visit (HOSPITAL_COMMUNITY): Payer: Self-pay

## 2022-10-29 ENCOUNTER — Ambulatory Visit: Payer: 59 | Admitting: Nurse Practitioner

## 2022-11-04 ENCOUNTER — Ambulatory Visit (INDEPENDENT_AMBULATORY_CARE_PROVIDER_SITE_OTHER): Payer: 59 | Admitting: Nurse Practitioner

## 2022-11-04 ENCOUNTER — Other Ambulatory Visit (HOSPITAL_COMMUNITY): Payer: Self-pay

## 2022-11-04 ENCOUNTER — Other Ambulatory Visit: Payer: Self-pay

## 2022-11-04 ENCOUNTER — Encounter: Payer: Self-pay | Admitting: Nurse Practitioner

## 2022-11-04 VITALS — BP 128/90 | HR 77 | Temp 98.2°F | Ht 62.0 in | Wt 215.0 lb

## 2022-11-04 DIAGNOSIS — R7303 Prediabetes: Secondary | ICD-10-CM | POA: Diagnosis not present

## 2022-11-04 DIAGNOSIS — R638 Other symptoms and signs concerning food and fluid intake: Secondary | ICD-10-CM | POA: Diagnosis not present

## 2022-11-04 DIAGNOSIS — Z6839 Body mass index (BMI) 39.0-39.9, adult: Secondary | ICD-10-CM

## 2022-11-04 DIAGNOSIS — E559 Vitamin D deficiency, unspecified: Secondary | ICD-10-CM

## 2022-11-04 DIAGNOSIS — Z79899 Other long term (current) drug therapy: Secondary | ICD-10-CM

## 2022-11-04 DIAGNOSIS — K5903 Drug induced constipation: Secondary | ICD-10-CM

## 2022-11-04 MED ORDER — VITAMIN D (ERGOCALCIFEROL) 1.25 MG (50000 UNIT) PO CAPS
50000.0000 [IU] | ORAL_CAPSULE | ORAL | 0 refills | Status: DC
Start: 2022-11-04 — End: 2022-12-03
  Filled 2022-11-04: qty 5, 35d supply, fill #0

## 2022-11-04 MED ORDER — ZEPBOUND 5 MG/0.5ML ~~LOC~~ SOAJ
5.0000 mg | SUBCUTANEOUS | 0 refills | Status: DC
Start: 2022-11-04 — End: 2023-01-06
  Filled 2022-11-04: qty 6, 84d supply, fill #0
  Filled 2022-11-04 – 2022-11-06 (×2): qty 2, 28d supply, fill #0
  Filled 2022-12-03: qty 2, 28d supply, fill #1

## 2022-11-04 MED ORDER — POLYETHYLENE GLYCOL 3350 17 GM/SCOOP PO POWD
17.0000 g | Freq: Every day | ORAL | 1 refills | Status: AC | PRN
Start: 2022-11-04 — End: ?
  Filled 2022-11-04 (×2): qty 714, 42d supply, fill #0
  Filled 2023-09-30: qty 714, 42d supply, fill #1

## 2022-11-04 MED ORDER — DOCUSATE SODIUM 100 MG PO CAPS
100.0000 mg | ORAL_CAPSULE | Freq: Two times a day (BID) | ORAL | 0 refills | Status: AC
Start: 2022-11-04 — End: ?
  Filled 2022-11-04 – 2022-11-06 (×3): qty 100, 50d supply, fill #0
  Filled 2023-09-30: qty 10, 5d supply, fill #0

## 2022-11-04 MED ORDER — BUPROPION HCL ER (XL) 300 MG PO TB24
300.0000 mg | ORAL_TABLET | Freq: Every day | ORAL | 0 refills | Status: DC
Start: 2022-11-04 — End: 2022-12-03
  Filled 2022-11-04: qty 30, 30d supply, fill #0

## 2022-11-04 NOTE — Patient Instructions (Signed)

## 2022-11-04 NOTE — Progress Notes (Signed)
Office: [phone]  /  Fax: [phone]  WEIGHT SUMMARY AND BIOMETRICS  Weight Lost Since Last Visit: 4lb  No data recorded  Vitals Temp: 98.2 F (36.8 C) BP: (!) 128/90 Pulse Rate: 77 SpO2: 100 %   Anthropometric Measurements Height:  (1.575 m) Weight: 215 lb (97.5 kg) BMI (Calculated): 39.31 Weight at Last Visit: 219lb Weight Lost Since Last Visit: 4lb Starting Weight: 245lb Total Weight Loss (lbs): 30 lb (13.6 kg)   Body Composition  Body Fat %: 44.4 % Fat Mass (lbs): 95.8 lbs Muscle Mass (lbs): 113.8 lbs Total Body Water (lbs): 86 lbs Visceral Fat Rating : 12   Other Clinical Data Fasting: Yes Today's Visit #: 17 Starting Date: 09/12/21     HPI  Chief Complaint: OBESITY  Breelle is here to discuss her progress with her obesity treatment plan. She is on the the Category 2 Plan and states she is following her eating plan approximately 70 % of the time. She states she is exercising 0 minutes 0 days per week.   Interval History:  Since last office visit she has lost 4 pounds.  She is aiming to eat more protein and vegetables.  She is drinking water and coffee daily.     Pharmacotherapy for weight loss: She is currently taking Zepbound  for medical weight loss.  Reports side effects of constipation and GERD.    Previous pharmacotherapy for medical weight loss:  none   Bariatric surgery:  Patient never had bariatric surgery.    Vit D deficiency  She is taking Vit D 50,000 IU weekly.  Denies side effects.  Denies nausea, vomiting or muscle weakness.    Lab Results  Component Value Date   VD25OH 26.45 (L) 05/14/2022   VD25OH 35.0 12/17/2021   VD25OH 10.2 (L) 09/12/2021    Abnormal cravings She is taking Wellbutrin XL .  Denies side effects.  Has helped with cravings. Doesn't crave sweets like she did in the past.     Prediabetes Last A1c was 6.0  Denies polyphagia or cravings.   Taking Zepbound   Lab Results  Component  Value Date   HGBA1C 6.0 05/14/2022   HGBA1C 5.7 (H) 12/17/2021   HGBA1C 6.1 (H) 09/12/2021   HGBA1C 6.1 05/09/2021   HGBA1C 6.1 11/08/2020   Lab Results  Component Value Date   INSULIN 12.2 12/17/2021   INSULIN 29.5 (H) 09/12/2021    PHYSICAL EXAM:  Blood pressure (!) 128/90, pulse 77, temperature 98.2 F (36.8 C), height  (1.575 m), weight 215 lb (97.5 kg), last menstrual period 10/14/2022, SpO2 100 %. Body mass index is 39.32 kg/m.  General: She is overweight, cooperative, alert, well developed, and in no acute distress. PSYCH: Has normal mood, affect and thought process.   Extremities: No edema.  Neurologic: No gross sensory or motor deficits. No tremors or fasciculations noted.    DIAGNOSTIC DATA REVIEWED:  BMET    Component Value Date/Time   NA 139 06/11/2022 0906   K 4.1 06/11/2022 0906   CL 101 06/11/2022 0906   CO2 25 06/11/2022 0906   GLUCOSE 111 (H) 06/11/2022 0906   GLUCOSE 93 05/14/2022 1119   BUN 16 06/11/2022 0906   CREATININE 1.02 (H) 06/11/2022 0906   CALCIUM 9.2 06/11/2022 0906   GFRNONAA >60 01/24/2021 1449   Lab Results  Component Value Date   HGBA1C 6.0 05/14/2022   HGBA1C 5.7 01/15/2016   Lab Results  Component Value Date   INSULIN 12.2 12/17/2021  INSULIN 29.5 (H) 09/12/2021   Lab Results  Component Value Date   TSH 1.140 09/12/2021   CBC    Component Value Date/Time   WBC 11.2 (H) 11/07/2021 1843   RBC 4.09 11/07/2021 1843   HGB 11.7 (L) 11/07/2021 1843   HCT 36.2 11/07/2021 1843   PLT 272 11/07/2021 1843   MCV 88.5 11/07/2021 1843   MCH 28.6 11/07/2021 1843   MCHC 32.3 11/07/2021 1843   RDW 12.5 11/07/2021 1843   Iron Studies No results found for: "IRON", "TIBC", "FERRITIN", "IRONPCTSAT" Lipid Panel     Component Value Date/Time   CHOL 157 05/14/2022 1119   TRIG 40.0 05/14/2022 1119   HDL 48.60 05/14/2022 1119   CHOLHDL 3 05/14/2022 1119   VLDL 8.0 05/14/2022 1119   LDLCALC 100 (H) 05/14/2022 1119   Hepatic  Function Panel     Component Value Date/Time   PROT 7.3 06/11/2022 0906   ALBUMIN 4.2 06/11/2022 0906   AST 14 06/11/2022 0906   ALT 16 06/11/2022 0906   ALKPHOS 71 06/11/2022 0906   BILITOT 0.4 06/11/2022 0906      Component Value Date/Time   TSH 1.140 09/12/2021 1007   Nutritional Lab Results  Component Value Date   VD25OH 26.45 (L) 05/14/2022   VD25OH 35.0 12/17/2021   VD25OH 10.2 (L) 09/12/2021     ASSESSMENT AND PLAN  TREATMENT PLAN FOR OBESITY:  Recommended Dietary Goals  Linda Mckinney is currently in the action stage of change. As such, her goal is to continue weight management plan. She has agreed to keeping a food journal and adhering to recommended goals of 1200 calories and 75 protein.  Behavioral Intervention  We discussed the following Behavioral Modification Strategies today: increasing lean protein intake, decreasing simple carbohydrates , increasing vegetables, increasing lower glycemic fruits, increasing fiber rich foods, avoiding skipping meals, increasing water intake, continue to practice mindfulness when eating, and planning for success.  Additional resources provided today: NA  Recommended Physical Activity Goals  Irais has been advised to work up to 150 minutes of moderate intensity aerobic activity a week and strengthening exercises 2-3 times per week for cardiovascular health, weight loss maintenance and preservation of muscle mass.   She has agreed to Think about ways to increase physical activity and Work on scheduling and tracking physical activity.    Pharmacotherapy We discussed various medication options to help Aaliya with her weight loss efforts and we both agreed to continue Zepbound 5mg .  Discussed again with patient not to get pregnant while taking due to possible birth defects.  ASSOCIATED CONDITIONS ADDRESSED TODAY  Action/Plan  Drug-induced constipation -     Docusate Sodium; Take 1 capsule (100 mg total) by mouth 2 (two) times  daily.  Dispense: 10 capsule; Refill: 0. Side effects discussed.   -     Polyethylene Glycol 3350; Take 17 g by mouth daily as needed.  Dispense: 3350 g; Refill: 1  Side effects discussed.  -     CBC with Differential/Platelet  Abnormal craving -     buPROPion HCl ER (XL); Take 1 tablet (300 mg total) by mouth daily.  Dispense: 30 tablet; Refill: 0              Side effects discussed.  -     CBC with Differential/Platelet  Vitamin D deficiency -     Vitamin D (Ergocalciferol); Take 1 capsule (50,000 Units total) by mouth every 7 (seven) days.  Dispense: 5 capsule; Refill: 0 -  VITAMIN D 25 Hydroxy (Vit-D Deficiency, Fractures) -     CBC with Differential/Platelet  Prediabetes -     Hemoglobin A1c -     Insulin, random -     CBC with Differential/Platelet  Carloyn will continue to work on weight loss, exercise, and decreasing simple carbohydrates to help decrease the risk of diabetes.    Medication management -     Comprehensive metabolic panel -     CBC with Differential/Platelet  Morbid obesity -     Zepbound; Inject 5 mg into the skin once a week.  Dispense: 6 mL; Refill: 0 -     TSH -     CBC with Differential/Platelet  BMI 39.0-39.9,adult -     TSH -     CBC with Differential/Platelet       Return in about 4 weeks (around 12/02/2022).Marland Kitchen She was informed of the importance of frequent follow up visits to maximize her success with intensive lifestyle modifications for her multiple health conditions.   ATTESTASTION STATEMENTS:  Reviewed by clinician on day of visit: allergies, medications, problem list, medical history, surgical history, family history, social history, and previous encounter notes.     Theodis Sato. Caryl Manas FNP-C

## 2022-11-05 ENCOUNTER — Other Ambulatory Visit (HOSPITAL_COMMUNITY): Payer: Self-pay

## 2022-11-05 ENCOUNTER — Other Ambulatory Visit: Payer: Self-pay

## 2022-11-05 LAB — COMPREHENSIVE METABOLIC PANEL
ALT: 22 IU/L (ref 0–32)
AST: 22 IU/L (ref 0–40)
Albumin/Globulin Ratio: 1.3 (ref 1.2–2.2)
Albumin: 4.1 g/dL (ref 3.9–4.9)
Alkaline Phosphatase: 68 IU/L (ref 44–121)
BUN/Creatinine Ratio: 17 (ref 9–23)
BUN: 16 mg/dL (ref 6–20)
Bilirubin Total: 0.6 mg/dL (ref 0.0–1.2)
CO2: 22 mmol/L (ref 20–29)
Calcium: 9.5 mg/dL (ref 8.7–10.2)
Chloride: 101 mmol/L (ref 96–106)
Creatinine, Ser: 0.93 mg/dL (ref 0.57–1.00)
Globulin, Total: 3.2 g/dL (ref 1.5–4.5)
Glucose: 80 mg/dL (ref 70–99)
Potassium: 4.1 mmol/L (ref 3.5–5.2)
Sodium: 139 mmol/L (ref 134–144)
Total Protein: 7.3 g/dL (ref 6.0–8.5)
eGFR: 80 mL/min/{1.73_m2} (ref >59)

## 2022-11-05 LAB — HEMOGLOBIN A1C
Est. average glucose Bld gHb Est-mCnc: 111 mg/dL
Hgb A1c MFr Bld: 5.5 % (ref 4.8–5.6)

## 2022-11-05 LAB — CBC WITH DIFFERENTIAL/PLATELET
Basophils Absolute: 0 10*3/uL (ref 0.0–0.2)
Basos: 0 %
EOS (ABSOLUTE): 0.1 10*3/uL (ref 0.0–0.4)
Eos: 1 %
Hematocrit: 40.1 % (ref 34.0–46.6)
Hemoglobin: 13.1 g/dL (ref 11.1–15.9)
Immature Grans (Abs): 0 10*3/uL (ref 0.0–0.1)
Immature Granulocytes: 0 %
Lymphocytes Absolute: 2.9 10*3/uL (ref 0.7–3.1)
Lymphs: 31 %
MCH: 29.8 pg (ref 26.6–33.0)
MCHC: 32.7 g/dL (ref 31.5–35.7)
MCV: 91 fL (ref 79–97)
Monocytes Absolute: 0.6 10*3/uL (ref 0.1–0.9)
Monocytes: 7 %
Neutrophils Absolute: 5.6 10*3/uL (ref 1.4–7.0)
Neutrophils: 61 %
Platelets: 266 10*3/uL (ref 150–450)
RBC: 4.4 x10E6/uL (ref 3.77–5.28)
RDW: 12.9 % (ref 11.7–15.4)
WBC: 9.2 10*3/uL (ref 3.4–10.8)

## 2022-11-05 LAB — VITAMIN D 25 HYDROXY (VIT D DEFICIENCY, FRACTURES): Vit D, 25-Hydroxy: 30 ng/mL (ref 30.0–100.0)

## 2022-11-05 LAB — INSULIN, RANDOM: INSULIN: 8.1 u[IU]/mL (ref 2.6–24.9)

## 2022-11-05 LAB — TSH: TSH: 1.01 u[IU]/mL (ref 0.450–4.500)

## 2022-11-06 ENCOUNTER — Other Ambulatory Visit: Payer: Self-pay

## 2022-11-07 ENCOUNTER — Other Ambulatory Visit (HOSPITAL_COMMUNITY): Payer: Self-pay

## 2022-11-07 ENCOUNTER — Other Ambulatory Visit: Payer: Self-pay

## 2022-11-18 ENCOUNTER — Other Ambulatory Visit (HOSPITAL_COMMUNITY): Payer: Self-pay

## 2022-11-19 ENCOUNTER — Telehealth: Payer: Self-pay

## 2022-11-19 NOTE — Telephone Encounter (Signed)
PA submitted through Cover My Meds for Zepbound.  Per Cover My Meds: This drug/product is not covered under the pharmacy benefit. Prior Authorization is not available.

## 2022-12-02 ENCOUNTER — Ambulatory Visit: Payer: 59 | Admitting: Nurse Practitioner

## 2022-12-03 ENCOUNTER — Other Ambulatory Visit (HOSPITAL_COMMUNITY): Payer: Self-pay

## 2022-12-03 ENCOUNTER — Other Ambulatory Visit: Payer: Self-pay

## 2022-12-03 ENCOUNTER — Encounter: Payer: Self-pay | Admitting: Nurse Practitioner

## 2022-12-03 ENCOUNTER — Ambulatory Visit (INDEPENDENT_AMBULATORY_CARE_PROVIDER_SITE_OTHER): Payer: 59 | Admitting: Nurse Practitioner

## 2022-12-03 VITALS — BP 127/88 | HR 79 | Temp 98.1°F | Ht 62.0 in | Wt 211.0 lb

## 2022-12-03 DIAGNOSIS — R638 Other symptoms and signs concerning food and fluid intake: Secondary | ICD-10-CM

## 2022-12-03 DIAGNOSIS — Z6838 Body mass index (BMI) 38.0-38.9, adult: Secondary | ICD-10-CM | POA: Diagnosis not present

## 2022-12-03 DIAGNOSIS — E559 Vitamin D deficiency, unspecified: Secondary | ICD-10-CM

## 2022-12-03 MED ORDER — VITAMIN D (ERGOCALCIFEROL) 1.25 MG (50000 UNIT) PO CAPS
50000.0000 [IU] | ORAL_CAPSULE | ORAL | 0 refills | Status: DC
Start: 2022-12-03 — End: 2023-01-06
  Filled 2022-12-03: qty 5, 35d supply, fill #0

## 2022-12-03 MED ORDER — BUPROPION HCL ER (XL) 300 MG PO TB24
300.0000 mg | ORAL_TABLET | Freq: Every day | ORAL | 0 refills | Status: DC
Start: 2022-12-03 — End: 2023-01-06
  Filled 2022-12-03: qty 30, 30d supply, fill #0

## 2022-12-03 NOTE — Progress Notes (Signed)
Office: [phone]  /  Fax: [phone]  WEIGHT SUMMARY AND BIOMETRICS  Weight Lost Since Last Visit: 4lb  No data recorded  Vitals Temp: 98.1 F (36.7 C) BP: 127/88 Pulse Rate: 79 SpO2: 100 %   Anthropometric Measurements Height: 5\' 2"  (1.575 m) Weight: 211 lb (95.7 kg) BMI (Calculated): 38.58 Weight at Last Visit: 215lb Weight Lost Since Last Visit: 4lb Starting Weight: 245lb Total Weight Loss (lbs): 34 lb (15.4 kg)   Body Composition  Body Fat %: 44.3 % Fat Mass (lbs): 93.6 lbs Muscle Mass (lbs): 111.8 lbs Total Body Water (lbs): 85 lbs Visceral Fat Rating : 11   Other Clinical Data Fasting: No Labs: No Today's Visit #: 18 Starting Date: 09/12/21     HPI  Chief Complaint: OBESITY  Linda Mckinney is here to discuss her progress with her obesity treatment plan. She is on the the Category 2 Plan and states she is following her eating plan approximately 80 % of the time. She states she is exercising 60 minutes 1 days per week.   Interval History:  Since last office visit she has lost 4 pounds.  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  She is drinking water daily.  Denies sugary drinks.     Pharmacotherapy for weight loss: She is currently taking Zepbound 5mg  for medical weight loss.  Reports side effects of nausea. Has been worse over the past couple of weeks. Takes Zofran and Pepcid PRN.    Previous pharmacotherapy for medical weight loss:  none  Bariatric surgery:  Patient has not had bariatric surgery.  Vit D deficiency  She is taking Vit D 50,000 IU weekly.  Denies side effects.    Lab Results  Component Value Date   VD25OH 30.0 11/04/2022   VD25OH 26.45 (L) 05/14/2022   VD25OH 35.0 12/17/2021    Abnormal cravings She is taking Wellbutrin XL 300mg .  Denies side effects.  Has helped with cravings. Doesn't crave sweets like she has in the past.   Asking for a refill today.    PHYSICAL EXAM:  Blood pressure 127/88, pulse 79, temperature 98.1  F (36.7 C), height 5\' 2"  (1.575 m), weight 211 lb (95.7 kg), last menstrual period 11/13/2022, SpO2 100 %. Body mass index is 38.59 kg/m.  General: She is overweight, cooperative, alert, well developed, and in no acute distress. PSYCH: Has normal mood, affect and thought process.   Extremities: No edema.  Neurologic: No gross sensory or motor deficits. No tremors or fasciculations noted.    DIAGNOSTIC DATA REVIEWED:  BMET    Component Value Date/Time   NA 139 11/04/2022 1120   K 4.1 11/04/2022 1120   CL 101 11/04/2022 1120   CO2 22 11/04/2022 1120   GLUCOSE 80 11/04/2022 1120   GLUCOSE 93 05/14/2022 1119   BUN 16 11/04/2022 1120   CREATININE 0.93 11/04/2022 1120   CALCIUM 9.5 11/04/2022 1120   GFRNONAA >60 01/24/2021 1449   Lab Results  Component Value Date   HGBA1C 5.5 11/04/2022   HGBA1C 5.7 01/15/2016   Lab Results  Component Value Date   INSULIN 8.1 11/04/2022   INSULIN 29.5 (H) 09/12/2021   Lab Results  Component Value Date   TSH 1.010 11/04/2022   CBC    Component Value Date/Time   WBC 9.2 11/04/2022 1120   WBC 11.2 (H) 11/07/2021 1843   RBC 4.40 11/04/2022 1120   RBC 4.09 11/07/2021 1843   HGB 13.1 11/04/2022 1120   HCT 40.1 11/04/2022 1120  PLT 266 11/04/2022 1120   MCV 91 11/04/2022 1120   MCH 29.8 11/04/2022 1120   MCH 28.6 11/07/2021 1843   MCHC 32.7 11/04/2022 1120   MCHC 32.3 11/07/2021 1843   RDW 12.9 11/04/2022 1120   Iron Studies No results found for: "IRON", "TIBC", "FERRITIN", "IRONPCTSAT" Lipid Panel     Component Value Date/Time   CHOL 157 05/14/2022 1119   TRIG 40.0 05/14/2022 1119   HDL 48.60 05/14/2022 1119   CHOLHDL 3 05/14/2022 1119   VLDL 8.0 05/14/2022 1119   LDLCALC 100 (H) 05/14/2022 1119   Hepatic Function Panel     Component Value Date/Time   PROT 7.3 11/04/2022 1120   ALBUMIN 4.1 11/04/2022 1120   AST 22 11/04/2022 1120   ALT 22 11/04/2022 1120   ALKPHOS 68 11/04/2022 1120   BILITOT 0.6 11/04/2022 1120       Component Value Date/Time   TSH 1.010 11/04/2022 1120   Nutritional Lab Results  Component Value Date   VD25OH 30.0 11/04/2022   VD25OH 26.45 (L) 05/14/2022   VD25OH 35.0 12/17/2021     ASSESSMENT AND PLAN  TREATMENT PLAN FOR OBESITY:  Recommended Dietary Goals  Linda Mckinney is currently in the action stage of change. As such, her goal is to continue weight management plan. She has agreed to keeping a food journal and adhering to recommended goals of 1200-1300 calories and 75+ protein.  Behavioral Intervention  We discussed the following Behavioral Modification Strategies today: increasing lean protein intake, decreasing simple carbohydrates , increasing vegetables, increasing lower glycemic fruits, increasing fiber rich foods, avoiding skipping meals, increasing water intake, continue to practice mindfulness when eating, and planning for success.  Additional resources provided today: NA  Recommended Physical Activity Goals  Linda Mckinney has been advised to work up to 150 minutes of moderate intensity aerobic activity a week and strengthening exercises 2-3 times per week for cardiovascular health, weight loss maintenance and preservation of muscle mass.   She has agreed to Continue current level of physical activity  and Think about ways to increase daily physical activity and overcoming barriers to exercise   Pharmacotherapy We discussed various medication options to help Linda Mckinney with her weight loss efforts and we both agreed to continue Zepbound 5mg  due to side effects of nausea.  Patient knows not to get pregnant while taking Zepbound.  She is not currently trying or wanting to get pregnant    ASSOCIATED CONDITIONS ADDRESSED TODAY  Action/Plan  Vitamin D deficiency -     Vitamin D (Ergocalciferol); Take 1 capsule (50,000 Units total) by mouth every 7 (seven) days.  Dispense: 5 capsule; Refill: 0. Side effects discussed.   Abnormal craving -     buPROPion HCl ER (XL); Take 1  tablet (300 mg total) by mouth daily.  Dispense: 30 tablet; Refill: 0. Side effects discussed.    Morbid obesity ([facility])  BMI 38.0-38.9,adult      Labs reviewed in chart from 11/04/22   Return in about 4 weeks (around 12/31/2022).Marland Kitchen She was informed of the importance of frequent follow up visits to maximize her success with intensive lifestyle modifications for her multiple health conditions.   ATTESTASTION STATEMENTS:  Reviewed by clinician on day of visit: allergies, medications, problem list, medical history, surgical history, family history, social history, and previous encounter notes.     Theodis Sato. Shaylon Gillean FNP-C

## 2022-12-04 ENCOUNTER — Encounter: Payer: Self-pay | Admitting: Nurse Practitioner

## 2022-12-04 ENCOUNTER — Encounter (HOSPITAL_COMMUNITY): Payer: Self-pay

## 2022-12-04 ENCOUNTER — Other Ambulatory Visit (HOSPITAL_COMMUNITY): Payer: Self-pay

## 2022-12-24 DIAGNOSIS — M546 Pain in thoracic spine: Secondary | ICD-10-CM | POA: Diagnosis not present

## 2022-12-24 DIAGNOSIS — M9902 Segmental and somatic dysfunction of thoracic region: Secondary | ICD-10-CM | POA: Diagnosis not present

## 2022-12-24 DIAGNOSIS — M955 Acquired deformity of pelvis: Secondary | ICD-10-CM | POA: Diagnosis not present

## 2022-12-24 DIAGNOSIS — M9905 Segmental and somatic dysfunction of pelvic region: Secondary | ICD-10-CM | POA: Diagnosis not present

## 2022-12-24 DIAGNOSIS — M9903 Segmental and somatic dysfunction of lumbar region: Secondary | ICD-10-CM | POA: Diagnosis not present

## 2022-12-24 DIAGNOSIS — M5441 Lumbago with sciatica, right side: Secondary | ICD-10-CM | POA: Diagnosis not present

## 2023-01-06 ENCOUNTER — Other Ambulatory Visit (HOSPITAL_COMMUNITY): Payer: Self-pay

## 2023-01-06 ENCOUNTER — Other Ambulatory Visit: Payer: Self-pay

## 2023-01-06 ENCOUNTER — Encounter: Payer: Self-pay | Admitting: Nurse Practitioner

## 2023-01-06 ENCOUNTER — Ambulatory Visit (INDEPENDENT_AMBULATORY_CARE_PROVIDER_SITE_OTHER): Payer: 59 | Admitting: Nurse Practitioner

## 2023-01-06 ENCOUNTER — Telehealth: Payer: Self-pay

## 2023-01-06 VITALS — BP 118/80 | HR 72 | Temp 98.4°F | Ht 62.0 in | Wt 212.0 lb

## 2023-01-06 DIAGNOSIS — Z6838 Body mass index (BMI) 38.0-38.9, adult: Secondary | ICD-10-CM | POA: Diagnosis not present

## 2023-01-06 DIAGNOSIS — E559 Vitamin D deficiency, unspecified: Secondary | ICD-10-CM | POA: Diagnosis not present

## 2023-01-06 DIAGNOSIS — R638 Other symptoms and signs concerning food and fluid intake: Secondary | ICD-10-CM | POA: Diagnosis not present

## 2023-01-06 MED ORDER — BUPROPION HCL ER (XL) 150 MG PO TB24
150.0000 mg | ORAL_TABLET | Freq: Every day | ORAL | 0 refills | Status: DC
Start: 2023-01-06 — End: 2023-02-04
  Filled 2023-01-06 (×2): qty 15, 15d supply, fill #0

## 2023-01-06 MED ORDER — CONTRAVE 8-90 MG PO TB12
ORAL_TABLET | ORAL | 0 refills | Status: DC
Start: 2023-01-06 — End: 2023-02-04
  Filled 2023-01-06: qty 120, 41d supply, fill #0
  Filled 2023-01-06: qty 120, 30d supply, fill #0
  Filled 2023-01-10: qty 78, 30d supply, fill #0

## 2023-01-06 MED ORDER — VITAMIN D (ERGOCALCIFEROL) 1.25 MG (50000 UNIT) PO CAPS
50000.0000 [IU] | ORAL_CAPSULE | ORAL | 0 refills | Status: DC
Start: 2023-01-06 — End: 2023-03-04
  Filled 2023-01-06 (×2): qty 5, 35d supply, fill #0

## 2023-01-06 NOTE — Telephone Encounter (Signed)
PA submitted through Cover My Meds for Contrave. Awaiting insurance determination. Key: [id number]

## 2023-01-06 NOTE — Progress Notes (Signed)
Office: [phone]  /  Fax: [phone]  WEIGHT SUMMARY AND BIOMETRICS  No data recorded Weight Gained Since Last Visit: 1lb   Vitals Temp: 98.4 F (36.9 C) BP: 118/80 Pulse Rate: 72 SpO2: 100 %   Anthropometric Measurements Height: 5\' 2"  (1.575 m) Weight: 212 lb (96.2 kg) BMI (Calculated): 38.77 Weight at Last Visit: 211lb Weight Gained Since Last Visit: 1lb Starting Weight: 245lb Total Weight Loss (lbs): 33 lb (15 kg)   Body Composition  Body Fat %: 43.7 % Fat Mass (lbs): 92.8 lbs Muscle Mass (lbs): 113.4 lbs Total Body Water (lbs): 85.2 lbs Visceral Fat Rating : 11   Other Clinical Data Fasting: No Labs: No Today's Visit #: 19 Starting Date: 09/12/21     HPI  Chief Complaint: OBESITY  Linda Mckinney is here to discuss her progress with her obesity treatment plan. She is on the the Category 2 Plan and states she is following her eating plan approximately 90 % of the time. She states she is exercising varying minutes 2 days per week.   Interval History:  Since last office visit she has gained 1 pound.  She is struggling and cravings especially since stopping Zepbound.  She is drinking water daily.  She is going on a cruise in October.    Pharmacotherapy for weight loss: She is not currently taking medications  for medical weight loss.   Previous pharmacotherapy for medical weight loss:  Zepbound-stopped due to cost/coverage  Bariatric surgery:  Patient has not had bariatric surgery.     Abnormal cravings She is taking Wellbutrin XL 300mg .  Denies side effects. Since stopping Zepbound, her cravings have gotten worse  Vit D deficiency  She is taking Vit D 50,000 IU weekly.  Denies side effects.  Denies nausea, vomiting or muscle weakness.    Lab Results  Component Value Date   VD25OH 30.0 11/04/2022   VD25OH 26.45 (L) 05/14/2022   VD25OH 35.0 12/17/2021     PHYSICAL EXAM:  Blood pressure 118/80, pulse 72, temperature 98.4 F (36.9 C), height  5\' 2"  (1.575 m), weight 212 lb (96.2 kg), last menstrual period 12/23/2022, SpO2 100 %. Body mass index is 38.78 kg/m.  General: She is overweight, cooperative, alert, well developed, and in no acute distress. PSYCH: Has normal mood, affect and thought process.   Extremities: No edema.  Neurologic: No gross sensory or motor deficits. No tremors or fasciculations noted.    DIAGNOSTIC DATA REVIEWED:  BMET    Component Value Date/Time   NA 139 11/04/2022 1120   K 4.1 11/04/2022 1120   CL 101 11/04/2022 1120   CO2 22 11/04/2022 1120   GLUCOSE 80 11/04/2022 1120   GLUCOSE 93 05/14/2022 1119   BUN 16 11/04/2022 1120   CREATININE 0.93 11/04/2022 1120   CALCIUM 9.5 11/04/2022 1120   GFRNONAA >60 01/24/2021 1449   Lab Results  Component Value Date   HGBA1C 5.5 11/04/2022   HGBA1C 5.7 01/15/2016   Lab Results  Component Value Date   INSULIN 8.1 11/04/2022   INSULIN 29.5 (H) 09/12/2021   Lab Results  Component Value Date   TSH 1.010 11/04/2022   CBC    Component Value Date/Time   WBC 9.2 11/04/2022 1120   WBC 11.2 (H) 11/07/2021 1843   RBC 4.40 11/04/2022 1120   RBC 4.09 11/07/2021 1843   HGB 13.1 11/04/2022 1120   HCT 40.1 11/04/2022 1120   PLT 266 11/04/2022 1120   MCV 91 11/04/2022 1120   MCH 29.8  11/04/2022 1120   MCH 28.6 11/07/2021 1843   MCHC 32.7 11/04/2022 1120   MCHC 32.3 11/07/2021 1843   RDW 12.9 11/04/2022 1120   Iron Studies No results found for: "IRON", "TIBC", "FERRITIN", "IRONPCTSAT" Lipid Panel     Component Value Date/Time   CHOL 157 05/14/2022 1119   TRIG 40.0 05/14/2022 1119   HDL 48.60 05/14/2022 1119   CHOLHDL 3 05/14/2022 1119   VLDL 8.0 05/14/2022 1119   LDLCALC 100 (H) 05/14/2022 1119   Hepatic Function Panel     Component Value Date/Time   PROT 7.3 11/04/2022 1120   ALBUMIN 4.1 11/04/2022 1120   AST 22 11/04/2022 1120   ALT 22 11/04/2022 1120   ALKPHOS 68 11/04/2022 1120   BILITOT 0.6 11/04/2022 1120      Component Value  Date/Time   TSH 1.010 11/04/2022 1120   Nutritional Lab Results  Component Value Date   VD25OH 30.0 11/04/2022   VD25OH 26.45 (L) 05/14/2022   VD25OH 35.0 12/17/2021     ASSESSMENT AND PLAN  TREATMENT PLAN FOR OBESITY:  Recommended Dietary Goals  Linda Mckinney is currently in the action stage of change. As such, her goal is to continue weight management plan. She has agreed to the Category 2 Plan.  Behavioral Intervention  We discussed the following Behavioral Modification Strategies today: increasing lean protein intake, decreasing simple carbohydrates , increasing vegetables, increasing lower glycemic fruits, avoiding skipping meals, increasing water intake, continue to practice mindfulness when eating, and planning for success.  Additional resources provided today: NA  Recommended Physical Activity Goals  Linda Mckinney has been advised to work up to 150 minutes of moderate intensity aerobic activity a week and strengthening exercises 2-3 times per week for cardiovascular health, weight loss maintenance and preservation of muscle mass.   She has agreed to Think about ways to increase daily physical activity and overcoming barriers to exercise, Increase physical activity in their day and reduce sedentary time (increase NEAT)., and Work on scheduling and tracking physical activity.    Pharmacotherapy We discussed various medication options to help Linda Mckinney with her weight loss efforts and we both agreed to decrease Wellbutrin dose as she starts Contrave (will take Wellbutrin XL 150mg  for one week and then stop) and tapers up on Contrave.  Side effects discussed.  Patient knows not to get pregnant while taking any weight loss medications. We discussed the risk of miscarriages and heart defects.  Patient verbalizes understanding.   ASSOCIATED CONDITIONS ADDRESSED TODAY  Action/Plan  Abnormal craving -     buPROPion HCl ER (XL); Take 1 tablet (150 mg total) by mouth daily.  Dispense: 15  tablet; Refill: 0. Side effects discussed.  See above.    Vitamin D deficiency -     Vitamin D (Ergocalciferol); Take 1 capsule (50,000 Units total) by mouth every 7 (seven) days.  Dispense: 5 capsule; Refill: 0  Morbid obesity (HCC) -     Contrave; Start 1 tablet every morning for 7 days, then 1 tablet twice daily for 7 days, then 2 tablets every morning and one every evening for 7 days and then 2 tablets BID  Dispense: 120 tablet; Refill: 0  BMI 38.0-38.9,adult -     Contrave; Start 1 tablet every morning for 7 days, then 1 tablet twice daily for 7 days, then 2 tablets every morning and one every evening for 7 days and then 2 tablets BID  Dispense: 120 tablet; Refill: 0         Return in  about 4 weeks (around 02/03/2023).Marland Kitchen She was informed of the importance of frequent follow up visits to maximize her success with intensive lifestyle modifications for her multiple health conditions.   ATTESTASTION STATEMENTS:  Reviewed by clinician on day of visit: allergies, medications, problem list, medical history, surgical history, family history, social history, and previous encounter notes.     Theodis Sato. Maynor Mwangi FNP-C

## 2023-01-09 NOTE — Telephone Encounter (Signed)
Fax received from [employer]. Contrave has been approved through 05/09/23.

## 2023-01-10 ENCOUNTER — Other Ambulatory Visit: Payer: Self-pay

## 2023-01-10 ENCOUNTER — Other Ambulatory Visit (HOSPITAL_COMMUNITY): Payer: Self-pay

## 2023-02-04 ENCOUNTER — Other Ambulatory Visit: Payer: Self-pay

## 2023-02-04 ENCOUNTER — Encounter: Payer: Self-pay | Admitting: Nurse Practitioner

## 2023-02-04 ENCOUNTER — Ambulatory Visit (INDEPENDENT_AMBULATORY_CARE_PROVIDER_SITE_OTHER): Payer: 59 | Admitting: Nurse Practitioner

## 2023-02-04 VITALS — BP 137/88 | HR 75 | Temp 98.4°F | Ht 62.0 in | Wt 216.0 lb

## 2023-02-04 DIAGNOSIS — Z6839 Body mass index (BMI) 39.0-39.9, adult: Secondary | ICD-10-CM

## 2023-02-04 DIAGNOSIS — E559 Vitamin D deficiency, unspecified: Secondary | ICD-10-CM | POA: Diagnosis not present

## 2023-02-04 MED ORDER — CONTRAVE 8-90 MG PO TB12
2.0000 | ORAL_TABLET | Freq: Two times a day (BID) | ORAL | 0 refills | Status: DC
Start: 2023-02-04 — End: 2023-03-04
  Filled 2023-02-04 – 2023-02-10 (×2): qty 120, 30d supply, fill #0

## 2023-02-04 NOTE — Progress Notes (Signed)
Office: [phone]  /  Fax: [phone]  WEIGHT SUMMARY AND BIOMETRICS  Weight Lost Since Last Visit: 0lb  Weight Gained Since Last Visit: 4lb   Vitals Temp: 98.4 F (36.9 C) BP: 137/88 Pulse Rate: 75 SpO2: 100 %   Anthropometric Measurements Height: 5\' 2"  (1.575 m) Weight: 216 lb (98 kg) BMI (Calculated): 39.5 Weight at Last Visit: 212lb Weight Lost Since Last Visit: 0lb Weight Gained Since Last Visit: 4lb Starting Weight: 245lb Total Weight Loss (lbs): 29 lb (13.2 kg)   Body Composition  Body Fat %: 44.8 % Fat Mass (lbs): 96.8 lbs Muscle Mass (lbs): 113.4 lbs Total Body Water (lbs): 87.8 lbs Visceral Fat Rating : 12   Other Clinical Data Fasting: No Labs: No Today's Visit #: 20 Starting Date: 09/12/21     HPI  Chief Complaint: OBESITY  Linda Mckinney is here to discuss her progress with her obesity treatment plan. She is on the the Category 2 Plan and states she is following her eating plan approximately 65 % of the time. She states she is exercising 0 minutes 0 days per week.  Interval History:  Since last office visit she has gained 4 pounds.  She started tracking last week.  Trying to drink more water.  Denies sugary drinks.    Pharmacotherapy for weight loss: She is currently taking Contrave 2 po BID (increased to 2 po BID yesterday) for medical weight loss.  Reports side effects of mood changes initially but feels she is overall doing better.     Contrave has been approved through 05/09/23.   Previous pharmacotherapy for medical weight loss:  Zepbound-stopped due to cost/coverage   Bariatric surgery:  Has not had bariatric surgery    Vit D deficiency  She is taking Vit D 50,000 IU weekly.  Denies side effects.  Denies nausea, vomiting or muscle weakness.    Lab Results  Component Value Date   VD25OH 30.0 11/04/2022   VD25OH 26.45 (L) 05/14/2022   VD25OH 35.0 12/17/2021      PHYSICAL EXAM:  Blood pressure 137/88, pulse 75, temperature  98.4 F (36.9 C), height 5\' 2"  (1.575 m), weight 216 lb (98 kg), last menstrual period 01/22/2023, SpO2 100%. Body mass index is 39.51 kg/m.  General: She is overweight, cooperative, alert, well developed, and in no acute distress. PSYCH: Has normal mood, affect and thought process.   Extremities: No edema.  Neurologic: No gross sensory or motor deficits. No tremors or fasciculations noted.    DIAGNOSTIC DATA REVIEWED:  BMET    Component Value Date/Time   NA 139 11/04/2022 1120   K 4.1 11/04/2022 1120   CL 101 11/04/2022 1120   CO2 22 11/04/2022 1120   GLUCOSE 80 11/04/2022 1120   GLUCOSE 93 05/14/2022 1119   BUN 16 11/04/2022 1120   CREATININE 0.93 11/04/2022 1120   CALCIUM 9.5 11/04/2022 1120   GFRNONAA >60 01/24/2021 1449   Lab Results  Component Value Date   HGBA1C 5.5 11/04/2022   HGBA1C 5.7 01/15/2016   Lab Results  Component Value Date   INSULIN 8.1 11/04/2022   INSULIN 29.5 (H) 09/12/2021   Lab Results  Component Value Date   TSH 1.010 11/04/2022   CBC    Component Value Date/Time   WBC 9.2 11/04/2022 1120   WBC 11.2 (H) 11/07/2021 1843   RBC 4.40 11/04/2022 1120   RBC 4.09 11/07/2021 1843   HGB 13.1 11/04/2022 1120   HCT 40.1 11/04/2022 1120   PLT 266 11/04/2022 1120  MCV 91 11/04/2022 1120   MCH 29.8 11/04/2022 1120   MCH 28.6 11/07/2021 1843   MCHC 32.7 11/04/2022 1120   MCHC 32.3 11/07/2021 1843   RDW 12.9 11/04/2022 1120   Iron Studies No results found for: "IRON", "TIBC", "FERRITIN", "IRONPCTSAT" Lipid Panel     Component Value Date/Time   CHOL 157 05/14/2022 1119   TRIG 40.0 05/14/2022 1119   HDL 48.60 05/14/2022 1119   CHOLHDL 3 05/14/2022 1119   VLDL 8.0 05/14/2022 1119   LDLCALC 100 (H) 05/14/2022 1119   Hepatic Function Panel     Component Value Date/Time   PROT 7.3 11/04/2022 1120   ALBUMIN 4.1 11/04/2022 1120   AST 22 11/04/2022 1120   ALT 22 11/04/2022 1120   ALKPHOS 68 11/04/2022 1120   BILITOT 0.6 11/04/2022 1120       Component Value Date/Time   TSH 1.010 11/04/2022 1120   Nutritional Lab Results  Component Value Date   VD25OH 30.0 11/04/2022   VD25OH 26.45 (L) 05/14/2022   VD25OH 35.0 12/17/2021     ASSESSMENT AND PLAN  TREATMENT PLAN FOR OBESITY:  Recommended Dietary Goals  Linda Mckinney is currently in the action stage of change. As such, her goal is to continue weight management plan. She has agreed to the Category 2 Plan-1200-1300 calories and 75+ grams of protein.  I've asked her to track and I will review at her next visit.    Behavioral Intervention  We discussed the following Behavioral Modification Strategies today: increasing lean protein intake, decreasing simple carbohydrates , increasing vegetables, increasing lower glycemic fruits, increasing water intake, keeping healthy foods at home, identifying sources and decreasing liquid calories, continue to practice mindfulness when eating, and planning for success.  Additional resources provided today: NA  Recommended Physical Activity Goals  Linda Mckinney has been advised to work up to 150 minutes of moderate intensity aerobic activity a week and strengthening exercises 2-3 times per week for cardiovascular health, weight loss maintenance and preservation of muscle mass.   She has agreed to Think about ways to increase daily physical activity and overcoming barriers to exercise and Increase physical activity in their day and reduce sedentary time (increase NEAT).   Pharmacotherapy We discussed various medication options to help Linda Mckinney with her weight loss efforts and we both agreed to continue Contrave until next visit.  ASSOCIATED CONDITIONS ADDRESSED TODAY  Action/Plan  Vitamin D deficiency Continue Vit D as directed  Low Vitamin D level contributes to fatigue and are associated with obesity, breast, and colon cancer. She agrees to continue to take prescription Vitamin D @50 ,000 IU every week and will follow-up for routine  testing of Vitamin D, at least 2-3 times per year to avoid over-replacement.   Morbid obesity (HCC) -     Contrave; Take 2 tablets po twice daily  Dispense: 120 tablet; Refill: 0  BMI 39.0-39.9,adult         Return in about 4 weeks (around 03/04/2023).Marland Kitchen She was informed of the importance of frequent follow up visits to maximize her success with intensive lifestyle modifications for her multiple health conditions.   ATTESTASTION STATEMENTS:  Reviewed by clinician on day of visit: allergies, medications, problem list, medical history, surgical history, family history, social history, and previous encounter notes.   Theodis Sato. Linda Kumari FNP-C

## 2023-02-10 ENCOUNTER — Encounter (HOSPITAL_COMMUNITY): Payer: Self-pay

## 2023-02-10 ENCOUNTER — Other Ambulatory Visit (HOSPITAL_COMMUNITY): Payer: Self-pay

## 2023-02-11 DIAGNOSIS — M5441 Lumbago with sciatica, right side: Secondary | ICD-10-CM | POA: Diagnosis not present

## 2023-02-11 DIAGNOSIS — M9902 Segmental and somatic dysfunction of thoracic region: Secondary | ICD-10-CM | POA: Diagnosis not present

## 2023-02-11 DIAGNOSIS — M955 Acquired deformity of pelvis: Secondary | ICD-10-CM | POA: Diagnosis not present

## 2023-02-11 DIAGNOSIS — M9903 Segmental and somatic dysfunction of lumbar region: Secondary | ICD-10-CM | POA: Diagnosis not present

## 2023-02-11 DIAGNOSIS — M9905 Segmental and somatic dysfunction of pelvic region: Secondary | ICD-10-CM | POA: Diagnosis not present

## 2023-02-11 DIAGNOSIS — M546 Pain in thoracic spine: Secondary | ICD-10-CM | POA: Diagnosis not present

## 2023-03-04 ENCOUNTER — Ambulatory Visit (INDEPENDENT_AMBULATORY_CARE_PROVIDER_SITE_OTHER): Payer: 59 | Admitting: Nurse Practitioner

## 2023-03-04 ENCOUNTER — Other Ambulatory Visit: Payer: Self-pay

## 2023-03-04 ENCOUNTER — Encounter: Payer: Self-pay | Admitting: Nurse Practitioner

## 2023-03-04 VITALS — BP 122/77 | HR 72 | Temp 98.2°F | Ht 62.0 in | Wt 211.0 lb

## 2023-03-04 DIAGNOSIS — Z79899 Other long term (current) drug therapy: Secondary | ICD-10-CM | POA: Diagnosis not present

## 2023-03-04 DIAGNOSIS — Z6838 Body mass index (BMI) 38.0-38.9, adult: Secondary | ICD-10-CM | POA: Diagnosis not present

## 2023-03-04 DIAGNOSIS — E559 Vitamin D deficiency, unspecified: Secondary | ICD-10-CM | POA: Diagnosis not present

## 2023-03-04 DIAGNOSIS — R7303 Prediabetes: Secondary | ICD-10-CM

## 2023-03-04 MED ORDER — CONTRAVE 8-90 MG PO TB12
2.0000 | ORAL_TABLET | Freq: Two times a day (BID) | ORAL | 0 refills | Status: DC
  Filled 2023-03-04 – 2023-03-08 (×2): qty 120, 30d supply, fill #0

## 2023-03-04 MED ORDER — VITAMIN D (ERGOCALCIFEROL) 1.25 MG (50000 UNIT) PO CAPS
50000.0000 [IU] | ORAL_CAPSULE | ORAL | 0 refills | Status: DC
Start: 2023-03-04 — End: 2023-04-01
  Filled 2023-03-04: qty 5, 35d supply, fill #0

## 2023-03-04 NOTE — Progress Notes (Signed)
Office: [phone]  /  Fax: [phone]  WEIGHT SUMMARY AND BIOMETRICS  Weight Lost Since Last Visit: 5lb  Weight Gained Since Last Visit: 0lb   Vitals Temp: 98.2 F (36.8 C) BP: 122/77 Pulse Rate: 72 SpO2: 100 %   Anthropometric Measurements Height: 5\' 2"  (1.575 m) Weight: 211 lb (95.7 kg) BMI (Calculated): 38.58 Weight at Last Visit: 216lb Weight Lost Since Last Visit: 5lb Weight Gained Since Last Visit: 0lb Starting Weight: 245lb Total Weight Loss (lbs): 34 lb (15.4 kg)   Body Composition  Body Fat %: 43.9 % Fat Mass (lbs): 93 lbs Muscle Mass (lbs): 112.8 lbs Total Body Water (lbs): 84.4 lbs Visceral Fat Rating : 11   Other Clinical Data Fasting: No Labs: No Today's Visit #: 21 Starting Date: 09/12/21     HPI  Chief Complaint: OBESITY  Linda Mckinney is here to discuss her progress with her obesity treatment plan. She is on the the Category 2 Plan and states she is following her eating plan approximately 50 % of the time. She states she is exercising 0 minutes 0 days per week.   Interval History:  Since last office visit she has lost 5 pounds.  She is using [employer]diary and is averaging around 1500 calories, 85 grams of protein, 156 carbs and 58 fats.  Denies polyphagia or cravings.     Pharmacotherapy for weight loss: She is currently taking Contrave 2 po BID for medical weight loss.  Denies side effects.    Contrave has been approved through 05/09/23   Previous pharmacotherapy for medical weight loss:  Zepbound-stopped due to cost/coverage   Bariatric surgery:  Patient has not had bariatric surgery  Prediabetes Last A1c was 5.5  Medication(s): none-has not taken Metformin in the past FH:  sister and father with DMT2 Lab Results  Component Value Date   HGBA1C 5.5 11/04/2022   HGBA1C 6.0 05/14/2022   HGBA1C 5.7 (H) 12/17/2021   HGBA1C 6.1 (H) 09/12/2021   HGBA1C 6.1 05/09/2021   Lab Results  Component Value Date   INSULIN 8.1 11/04/2022    INSULIN 12.2 12/17/2021   INSULIN 29.5 (H) 09/12/2021    Vit D deficiency  She is taking Vit D 50,000 IU weekly.  Denies side effects.  Denies nausea, vomiting or muscle weakness.    Lab Results  Component Value Date   VD25OH 30.0 11/04/2022   VD25OH 26.45 (L) 05/14/2022   VD25OH 35.0 12/17/2021     PHYSICAL EXAM:  Blood pressure 122/77, pulse 72, temperature 98.2 F (36.8 C), height 5\' 2"  (1.575 m), weight 211 lb (95.7 kg), last menstrual period 01/22/2023, SpO2 100%. Body mass index is 38.59 kg/m.  General: She is overweight, cooperative, alert, well developed, and in no acute distress. PSYCH: Has normal mood, affect and thought process.   Extremities: No edema.  Neurologic: No gross sensory or motor deficits. No tremors or fasciculations noted.    DIAGNOSTIC DATA REVIEWED:  BMET    Component Value Date/Time   NA 139 11/04/2022 1120   K 4.1 11/04/2022 1120   CL 101 11/04/2022 1120   CO2 22 11/04/2022 1120   GLUCOSE 80 11/04/2022 1120   GLUCOSE 93 05/14/2022 1119   BUN 16 11/04/2022 1120   CREATININE 0.93 11/04/2022 1120   CALCIUM 9.5 11/04/2022 1120   GFRNONAA >60 01/24/2021 1449   Lab Results  Component Value Date   HGBA1C 5.5 11/04/2022   HGBA1C 5.7 01/15/2016   Lab Results  Component Value Date   INSULIN 8.1  11/04/2022   INSULIN 29.5 (H) 09/12/2021   Lab Results  Component Value Date   TSH 1.010 11/04/2022   CBC    Component Value Date/Time   WBC 9.2 11/04/2022 1120   WBC 11.2 (H) 11/07/2021 1843   RBC 4.40 11/04/2022 1120   RBC 4.09 11/07/2021 1843   HGB 13.1 11/04/2022 1120   HCT 40.1 11/04/2022 1120   PLT 266 11/04/2022 1120   MCV 91 11/04/2022 1120   MCH 29.8 11/04/2022 1120   MCH 28.6 11/07/2021 1843   MCHC 32.7 11/04/2022 1120   MCHC 32.3 11/07/2021 1843   RDW 12.9 11/04/2022 1120   Iron Studies No results found for: "IRON", "TIBC", "FERRITIN", "IRONPCTSAT" Lipid Panel     Component Value Date/Time   CHOL 157 05/14/2022 1119    TRIG 40.0 05/14/2022 1119   HDL 48.60 05/14/2022 1119   CHOLHDL 3 05/14/2022 1119   VLDL 8.0 05/14/2022 1119   LDLCALC 100 (H) 05/14/2022 1119   Hepatic Function Panel     Component Value Date/Time   PROT 7.3 11/04/2022 1120   ALBUMIN 4.1 11/04/2022 1120   AST 22 11/04/2022 1120   ALT 22 11/04/2022 1120   ALKPHOS 68 11/04/2022 1120   BILITOT 0.6 11/04/2022 1120      Component Value Date/Time   TSH 1.010 11/04/2022 1120   Nutritional Lab Results  Component Value Date   VD25OH 30.0 11/04/2022   VD25OH 26.45 (L) 05/14/2022   VD25OH 35.0 12/17/2021     ASSESSMENT AND PLAN  TREATMENT PLAN FOR OBESITY:  Recommended Dietary Goals  Linda Mckinney is currently in the action stage of change. As such, her goal is to continue weight management plan. She has agreed to keeping a food journal and adhering to recommended goals of 1300 calories and 75+ protein.  Behavioral Intervention  We discussed the following Behavioral Modification Strategies today: increasing lean protein intake, decreasing simple carbohydrates , increasing vegetables, increasing lower glycemic fruits, increasing water intake, work on tracking and journaling calories using tracking application, reading food labels , keeping healthy foods at home, continue to practice mindfulness when eating, and planning for success.  Additional resources provided today: NA  Recommended Physical Activity Goals  Linda Mckinney has been advised to work up to 150 minutes of moderate intensity aerobic activity a week and strengthening exercises 2-3 times per week for cardiovascular health, weight loss maintenance and preservation of muscle mass.   She has agreed to Think about ways to increase daily physical activity and overcoming barriers to exercise and Increase physical activity in their day and reduce sedentary time (increase NEAT).   Pharmacotherapy We discussed various medication options to help Linda Mckinney with her weight loss efforts  and we both agreed to continue Contrave 2 po BID.  Side effects discussed.   ASSOCIATED CONDITIONS ADDRESSED TODAY  Action/Plan  Prediabetes -     Hemoglobin A1c -     Insulin, random  Linda Mckinney will continue to work on weight loss, exercise, and decreasing simple carbohydrates to help decrease the risk of diabetes.    Vitamin D deficiency -     VITAMIN D 25 Hydroxy (Vit-D Deficiency, Fractures) -     Vitamin D (Ergocalciferol); Take 1 capsule by mouth every 7 days.  Dispense: 5 capsule; Refill: 0  Low Vitamin D level contributes to fatigue and are associated with obesity, breast, and colon cancer. She agrees to continue to take prescription Vitamin D @50 ,000 IU every week and will follow-up for routine testing of Vitamin D,  at least 2-3 times per year to avoid over-replacement.   Medication management -     Comprehensive metabolic panel  Morbid obesity (HCC) -     Contrave; Take 2 tablets by mouth 2 (two) times daily.  Dispense: 120 tablet; Refill: 0  BMI 38.0-38.9,adult         No follow-ups on file.Marland Kitchen She was informed of the importance of frequent follow up visits to maximize her success with intensive lifestyle modifications for her multiple health conditions.   ATTESTASTION STATEMENTS:  Reviewed by clinician on day of visit: allergies, medications, problem list, medical history, surgical history, family history, social history, and previous encounter notes.     Theodis Sato. Jaydy Fitzhenry FNP-C

## 2023-03-04 NOTE — Patient Instructions (Signed)

## 2023-03-05 ENCOUNTER — Other Ambulatory Visit (HOSPITAL_COMMUNITY): Payer: Self-pay

## 2023-03-05 LAB — COMPREHENSIVE METABOLIC PANEL
ALT: 23 IU/L (ref 0–32)
AST: 18 IU/L (ref 0–40)
Albumin: 4.3 g/dL (ref 3.9–4.9)
Alkaline Phosphatase: 75 IU/L (ref 44–121)
BUN/Creatinine Ratio: 17 (ref 9–23)
BUN: 20 mg/dL (ref 6–20)
Bilirubin Total: 0.4 mg/dL (ref 0.0–1.2)
CO2: 23 mmol/L (ref 20–29)
Calcium: 9.8 mg/dL (ref 8.7–10.2)
Chloride: 102 mmol/L (ref 96–106)
Creatinine, Ser: 1.2 mg/dL — ABNORMAL HIGH (ref 0.57–1.00)
Globulin, Total: 3 g/dL (ref 1.5–4.5)
Glucose: 96 mg/dL (ref 70–99)
Potassium: 4.5 mmol/L (ref 3.5–5.2)
Sodium: 139 mmol/L (ref 134–144)
Total Protein: 7.3 g/dL (ref 6.0–8.5)
eGFR: 59 mL/min/{1.73_m2} — ABNORMAL LOW (ref >59)

## 2023-03-05 LAB — INSULIN, RANDOM: INSULIN: 11.9 u[IU]/mL (ref 2.6–24.9)

## 2023-03-05 LAB — HEMOGLOBIN A1C
Est. average glucose Bld gHb Est-mCnc: 117 mg/dL
Hgb A1c MFr Bld: 5.7 % — ABNORMAL HIGH (ref 4.8–5.6)

## 2023-03-05 LAB — VITAMIN D 25 HYDROXY (VIT D DEFICIENCY, FRACTURES): Vit D, 25-Hydroxy: 34.8 ng/mL (ref 30.0–100.0)

## 2023-03-09 ENCOUNTER — Other Ambulatory Visit (HOSPITAL_COMMUNITY): Payer: Self-pay

## 2023-03-10 ENCOUNTER — Other Ambulatory Visit: Payer: Self-pay

## 2023-03-11 DIAGNOSIS — M9902 Segmental and somatic dysfunction of thoracic region: Secondary | ICD-10-CM | POA: Diagnosis not present

## 2023-03-11 DIAGNOSIS — M5441 Lumbago with sciatica, right side: Secondary | ICD-10-CM | POA: Diagnosis not present

## 2023-03-11 DIAGNOSIS — M546 Pain in thoracic spine: Secondary | ICD-10-CM | POA: Diagnosis not present

## 2023-03-11 DIAGNOSIS — M9903 Segmental and somatic dysfunction of lumbar region: Secondary | ICD-10-CM | POA: Diagnosis not present

## 2023-03-11 DIAGNOSIS — M9905 Segmental and somatic dysfunction of pelvic region: Secondary | ICD-10-CM | POA: Diagnosis not present

## 2023-03-11 DIAGNOSIS — M955 Acquired deformity of pelvis: Secondary | ICD-10-CM | POA: Diagnosis not present

## 2023-04-01 ENCOUNTER — Other Ambulatory Visit (HOSPITAL_COMMUNITY): Payer: Self-pay

## 2023-04-01 ENCOUNTER — Encounter: Payer: Self-pay | Admitting: Nurse Practitioner

## 2023-04-01 ENCOUNTER — Ambulatory Visit (INDEPENDENT_AMBULATORY_CARE_PROVIDER_SITE_OTHER): Payer: 59 | Admitting: Nurse Practitioner

## 2023-04-01 VITALS — BP 131/85 | HR 77 | Temp 97.9°F | Ht 62.0 in | Wt 213.0 lb

## 2023-04-01 DIAGNOSIS — Z6838 Body mass index (BMI) 38.0-38.9, adult: Secondary | ICD-10-CM | POA: Diagnosis not present

## 2023-04-01 DIAGNOSIS — R7303 Prediabetes: Secondary | ICD-10-CM | POA: Diagnosis not present

## 2023-04-01 DIAGNOSIS — R748 Abnormal levels of other serum enzymes: Secondary | ICD-10-CM | POA: Diagnosis not present

## 2023-04-01 DIAGNOSIS — R638 Other symptoms and signs concerning food and fluid intake: Secondary | ICD-10-CM

## 2023-04-01 DIAGNOSIS — E559 Vitamin D deficiency, unspecified: Secondary | ICD-10-CM | POA: Diagnosis not present

## 2023-04-01 MED ORDER — VITAMIN D (ERGOCALCIFEROL) 1.25 MG (50000 UNIT) PO CAPS
50000.0000 [IU] | ORAL_CAPSULE | ORAL | 0 refills | Status: DC
  Filled 2023-04-01 – 2023-04-28 (×3): qty 5, 35d supply, fill #0

## 2023-04-01 MED ORDER — TOPIRAMATE 50 MG PO TABS
ORAL_TABLET | ORAL | 0 refills | Status: DC
Start: 2023-04-01 — End: 2023-07-08
  Filled 2023-04-01 – 2023-04-28 (×3): qty 30, 33d supply, fill #0

## 2023-04-01 MED ORDER — CONTRAVE 8-90 MG PO TB12
2.0000 | ORAL_TABLET | Freq: Two times a day (BID) | ORAL | 0 refills | Status: DC
  Filled 2023-04-01: qty 120, 30d supply, fill #0

## 2023-04-01 NOTE — Patient Instructions (Signed)

## 2023-04-01 NOTE — Progress Notes (Signed)
Office: [phone]  /  Fax: [phone]  WEIGHT SUMMARY AND BIOMETRICS  Weight Lost Since Last Visit: 0lb  Weight Gained Since Last Visit: 2lb   Vitals Temp: 97.9 F (36.6 C) BP: 131/85 Pulse Rate: 77 SpO2: 100 %   Anthropometric Measurements Height: 5\' 2"  (1.575 m) Weight: 213 lb (96.6 kg) BMI (Calculated): 38.95 Weight at Last Visit: 211lb Weight Lost Since Last Visit: 0lb Weight Gained Since Last Visit: 2lb Starting Weight: 245lb Total Weight Loss (lbs): 32 lb (14.5 kg)   Body Composition  Body Fat %: 44.2 % Fat Mass (lbs): 94.2 lbs Muscle Mass (lbs): 113 lbs Total Body Water (lbs): 85.6 lbs Visceral Fat Rating : 11   Other Clinical Data Fasting: Yes Labs: No Today's Visit #: 22 Starting Date: 09/12/21     HPI  Chief Complaint: OBESITY  Linda Mckinney is here to discuss her progress with her obesity treatment plan. She is on the the Category 2 Plan and states she is following her eating plan approximately 60 % of the time. She states she is exercising 0 minutes 0 days per week.   Interval History:  Since last office visit she has gained 2 pounds. She is struggling with cravings and snacking.  She is drinking water and flavored water daily.     Pharmacotherapy for weight loss: She is currently taking Contrave 2 po BID for medical weight loss.  Denies side effects.    Previous pharmacotherapy for medical weight loss:  Zepbound-stopped due to cost/coverage   Bariatric surgery:  Patient has not had bariatric surgery   Vit D deficiency  She is taking Vit D 50,000 IU weekly.  Denies side effects.  Denies nausea, vomiting or muscle weakness.    Lab Results  Component Value Date   VD25OH 34.8 03/04/2023   VD25OH 30.0 11/04/2022   VD25OH 26.45 (L) 05/14/2022    Elevated creat Trying to increase water intake.    Prediabetes Last A1c was 5.7  Medication(s): not on meds.  Consider Metformin at next visit.  Polyphagia:Yes and cravings.  Lab Results   Component Value Date   HGBA1C 5.7 (H) 03/04/2023   HGBA1C 5.5 11/04/2022   HGBA1C 6.0 05/14/2022   HGBA1C 5.7 (H) 12/17/2021   HGBA1C 6.1 (H) 09/12/2021   Lab Results  Component Value Date   INSULIN 11.9 03/04/2023   INSULIN 8.1 11/04/2022   INSULIN 12.2 12/17/2021   INSULIN 29.5 (H) 09/12/2021      PHYSICAL EXAM:  Blood pressure 131/85, pulse 77, temperature 97.9 F (36.6 C), height 5\' 2"  (1.575 m), weight 213 lb (96.6 kg), last menstrual period 03/10/2023, SpO2 100%. Body mass index is 38.96 kg/m.  General: She is overweight, cooperative, alert, well developed, and in no acute distress. PSYCH: Has normal mood, affect and thought process.   Extremities: No edema.  Neurologic: No gross sensory or motor deficits. No tremors or fasciculations noted.    DIAGNOSTIC DATA REVIEWED:  BMET    Component Value Date/Time   NA 139 03/04/2023 0930   K 4.5 03/04/2023 0930   CL 102 03/04/2023 0930   CO2 23 03/04/2023 0930   GLUCOSE 96 03/04/2023 0930   GLUCOSE 93 05/14/2022 1119   BUN 20 03/04/2023 0930   CREATININE 1.20 (H) 03/04/2023 0930   CALCIUM 9.8 03/04/2023 0930   GFRNONAA >60 01/24/2021 1449   Lab Results  Component Value Date   HGBA1C 5.7 (H) 03/04/2023   HGBA1C 5.7 01/15/2016   Lab Results  Component Value Date  INSULIN 11.9 03/04/2023   INSULIN 29.5 (H) 09/12/2021   Lab Results  Component Value Date   TSH 1.010 11/04/2022   CBC    Component Value Date/Time   WBC 9.2 11/04/2022 1120   WBC 11.2 (H) 11/07/2021 1843   RBC 4.40 11/04/2022 1120   RBC 4.09 11/07/2021 1843   HGB 13.1 11/04/2022 1120   HCT 40.1 11/04/2022 1120   PLT 266 11/04/2022 1120   MCV 91 11/04/2022 1120   MCH 29.8 11/04/2022 1120   MCH 28.6 11/07/2021 1843   MCHC 32.7 11/04/2022 1120   MCHC 32.3 11/07/2021 1843   RDW 12.9 11/04/2022 1120   Iron Studies No results found for: "IRON", "TIBC", "FERRITIN", "IRONPCTSAT" Lipid Panel     Component Value Date/Time   CHOL 157  05/14/2022 1119   TRIG 40.0 05/14/2022 1119   HDL 48.60 05/14/2022 1119   CHOLHDL 3 05/14/2022 1119   VLDL 8.0 05/14/2022 1119   LDLCALC 100 (H) 05/14/2022 1119   Hepatic Function Panel     Component Value Date/Time   PROT 7.3 03/04/2023 0930   ALBUMIN 4.3 03/04/2023 0930   AST 18 03/04/2023 0930   ALT 23 03/04/2023 0930   ALKPHOS 75 03/04/2023 0930   BILITOT 0.4 03/04/2023 0930      Component Value Date/Time   TSH 1.010 11/04/2022 1120   Nutritional Lab Results  Component Value Date   VD25OH 34.8 03/04/2023   VD25OH 30.0 11/04/2022   VD25OH 26.45 (L) 05/14/2022     ASSESSMENT AND PLAN  TREATMENT PLAN FOR OBESITY:  Recommended Dietary Goals  Tyiesha is currently in the action stage of change. As such, her goal is to continue weight management plan. She has agreed to the Category 2 Plan.  Behavioral Intervention  We discussed the following Behavioral Modification Strategies today: increasing lean protein intake, decreasing simple carbohydrates , increasing vegetables, increasing lower glycemic fruits, increasing water intake, work on tracking and journaling calories using tracking application, reading food labels , keeping healthy foods at home, continue to practice mindfulness when eating, and planning for success.  Additional resources provided today: NA  Recommended Physical Activity Goals  Antonique has been advised to work up to 150 minutes of moderate intensity aerobic activity a week and strengthening exercises 2-3 times per week for cardiovascular health, weight loss maintenance and preservation of muscle mass.   She has agreed to Think about ways to increase daily physical activity and overcoming barriers to exercise, Increase physical activity in their day and reduce sedentary time (increase NEAT)., and Work on scheduling and tracking physical activity.    Pharmacotherapy We discussed various medication options to help Lititia with her weight loss efforts  and we both agreed to continue Contrave and add Topamax for cravings. Using condoms for birth control.  Knows not to get pregnant due to possible birth defects with Topamax- cat X.  ASSOCIATED CONDITIONS ADDRESSED TODAY  Action/Plan  Vitamin D deficiency -     Vitamin D (Ergocalciferol); Take 1 capsule by mouth every 7 days.  Dispense: 5 capsule; Refill: 0  Low Vitamin D level contributes to fatigue and are associated with obesity, breast, and colon cancer. She agrees to continue to take prescription Vitamin D @50 ,000 IU every week and will follow-up for routine testing of Vitamin D, at least 2-3 times per year to avoid over-replacement.   Elevated creatine kinase -     Basic metabolic panel  Will continue to monitor especially with starting Topamax.   Prediabetes Inayah  will continue to work on weight loss, exercise, and decreasing simple carbohydrates to help decrease the risk of diabetes.    Consider Metformin  Abnormal craving -     Start Topiramate; Take 0.5 tablets (25 mg total) by mouth daily for 7 days, THEN 1 tablet (50 mg total) daily.  Dispense: 30 tablet; Refill: 0  Morbid obesity (HCC) -     Continue Contrave; Take 2 tablets by mouth 2 (two) times daily.  Dispense: 120 tablet; Refill: 0.  Side effects discussed  BMI 38.0-38.9,adult     Options discussed today:  Add topamax for cravings Stop Contrave and start Qsymia Victoza off label for weight loss Zepbound vials  Consider Metformin at next visit if doesn't feel topamax has been beneficial.   Labs reviewed in chart from 03/04/23  Return in about 4 weeks (around 04/29/2023).Marland Kitchen She was informed of the importance of frequent follow up visits to maximize her success with intensive lifestyle modifications for her multiple health conditions.   ATTESTASTION STATEMENTS:  Reviewed by clinician on day of visit: allergies, medications, problem list, medical history, surgical history, family history, social history, and  previous encounter notes.     Theodis Sato. Azul Brumett FNP-C

## 2023-04-02 ENCOUNTER — Other Ambulatory Visit: Payer: Self-pay

## 2023-04-03 ENCOUNTER — Encounter: Payer: Self-pay | Admitting: Pharmacist

## 2023-04-03 ENCOUNTER — Other Ambulatory Visit: Payer: Self-pay

## 2023-04-05 ENCOUNTER — Encounter (HOSPITAL_COMMUNITY): Payer: Self-pay

## 2023-04-07 ENCOUNTER — Other Ambulatory Visit: Payer: Self-pay

## 2023-04-08 DIAGNOSIS — M546 Pain in thoracic spine: Secondary | ICD-10-CM | POA: Diagnosis not present

## 2023-04-08 DIAGNOSIS — M9902 Segmental and somatic dysfunction of thoracic region: Secondary | ICD-10-CM | POA: Diagnosis not present

## 2023-04-08 DIAGNOSIS — M9905 Segmental and somatic dysfunction of pelvic region: Secondary | ICD-10-CM | POA: Diagnosis not present

## 2023-04-08 DIAGNOSIS — M955 Acquired deformity of pelvis: Secondary | ICD-10-CM | POA: Diagnosis not present

## 2023-04-08 DIAGNOSIS — M5441 Lumbago with sciatica, right side: Secondary | ICD-10-CM | POA: Diagnosis not present

## 2023-04-08 DIAGNOSIS — M9903 Segmental and somatic dysfunction of lumbar region: Secondary | ICD-10-CM | POA: Diagnosis not present

## 2023-04-27 ENCOUNTER — Other Ambulatory Visit (HOSPITAL_COMMUNITY): Payer: Self-pay

## 2023-04-28 ENCOUNTER — Other Ambulatory Visit (HOSPITAL_COMMUNITY): Payer: Self-pay

## 2023-05-06 ENCOUNTER — Ambulatory Visit (INDEPENDENT_AMBULATORY_CARE_PROVIDER_SITE_OTHER): Payer: 59 | Admitting: Nurse Practitioner

## 2023-05-06 ENCOUNTER — Other Ambulatory Visit: Payer: Self-pay

## 2023-05-06 ENCOUNTER — Encounter: Payer: Self-pay | Admitting: Nurse Practitioner

## 2023-05-06 VITALS — BP 145/88 | HR 80 | Temp 98.1°F | Ht 62.0 in | Wt 216.0 lb

## 2023-05-06 DIAGNOSIS — Z6839 Body mass index (BMI) 39.0-39.9, adult: Secondary | ICD-10-CM | POA: Diagnosis not present

## 2023-05-06 DIAGNOSIS — E559 Vitamin D deficiency, unspecified: Secondary | ICD-10-CM | POA: Diagnosis not present

## 2023-05-06 DIAGNOSIS — R748 Abnormal levels of other serum enzymes: Secondary | ICD-10-CM | POA: Diagnosis not present

## 2023-05-06 DIAGNOSIS — R638 Other symptoms and signs concerning food and fluid intake: Secondary | ICD-10-CM

## 2023-05-06 MED ORDER — CONTRAVE 8-90 MG PO TB12
2.0000 | ORAL_TABLET | Freq: Two times a day (BID) | ORAL | 0 refills | Status: DC
  Filled 2023-05-06: qty 120, 30d supply, fill #0

## 2023-05-06 NOTE — Progress Notes (Signed)
Office: [phone]  /  Fax: [phone]  WEIGHT SUMMARY AND BIOMETRICS  Weight Lost Since Last Visit: 0lb  Weight Gained Since Last Visit: 3lb   Vitals Temp: 98.1 F (36.7 C) BP: (!) 145/88 Pulse Rate: 80 SpO2: 90 %   Anthropometric Measurements Height: 5\' 2"  (1.575 m) Weight: 216 lb (98 kg) BMI (Calculated): 39.5 Weight at Last Visit: 213lb Weight Lost Since Last Visit: 0lb Weight Gained Since Last Visit: 3lb Starting Weight: 245lb Total Weight Loss (lbs): 29 lb (13.2 kg)   Body Composition  Body Fat %: 44.2 % Fat Mass (lbs): 95.4 lbs Muscle Mass (lbs): 114.6 lbs Total Body Water (lbs): 84.4 lbs Visceral Fat Rating : 12   Other Clinical Data Fasting: No Labs: No Today's Visit #: 23 Starting Date: 09/12/21     HPI  Chief Complaint: OBESITY  Linda Mckinney is here to discuss her progress with her obesity treatment plan. She is on the the Category 2 Plan and states she is following her eating plan approximately 50 % of the time. She states she is exercising 0 minutes 0 days per week.   Interval History:  Since last office visit she has gained 3 pounds.  She went on a cruise since her last visit. She has been sick and taking OTC meds.     Pharmacotherapy for weight loss: She is currently taking Contrave 2 po BID for medical weight loss (stopped for 2 weeks due to going on a cruise-restarted taking it a week ago) and Topamax 50mg  for cravings (prescribed after last visit). Started taking Topamax a week ago.  Reports side effects of daytime sleepiness.  Denies polyphagia or cravings with Contrave and Topamax.    Previous pharmacotherapy for medical weight loss:  Zepbound-stopped due to cost/coverage   Bariatric surgery:  Patient has not had bariatric surgery   Vit D deficiency  She is taking Vit D 50,000 IU weekly.  Denies side effects.  Denies nausea, vomiting or muscle weakness.    Lab Results  Component Value Date   VD25OH 34.8 03/04/2023   VD25OH  30.0 11/04/2022   VD25OH 26.45 (L) 05/14/2022    Elevated creat levels Last labs looked better  Prediabetes Last A1c was 5.7  Medication(s): None Polyphagia:Yes Cravings:  Yes Lab Results  Component Value Date   HGBA1C 5.7 (H) 03/04/2023   HGBA1C 5.5 11/04/2022   HGBA1C 6.0 05/14/2022   HGBA1C 5.7 (H) 12/17/2021   HGBA1C 6.1 (H) 09/12/2021   Lab Results  Component Value Date   INSULIN 11.9 03/04/2023   INSULIN 8.1 11/04/2022   INSULIN 12.2 12/17/2021   INSULIN 29.5 (H) 09/12/2021     PHYSICAL EXAM:  Blood pressure (!) 145/88, pulse 80, temperature 98.1 F (36.7 C), height 5\' 2"  (1.575 m), weight 216 lb (98 kg), SpO2 90%. Body mass index is 39.51 kg/m.  General: She is overweight, cooperative, alert, well developed, and in no acute distress. PSYCH: Has normal mood, affect and thought process.   Extremities: No edema.  Neurologic: No gross sensory or motor deficits. No tremors or fasciculations noted.    DIAGNOSTIC DATA REVIEWED:  BMET    Component Value Date/Time   NA 141 04/01/2023 1048   K 4.4 04/01/2023 1048   CL 104 04/01/2023 1048   CO2 22 04/01/2023 1048   GLUCOSE 90 04/01/2023 1048   GLUCOSE 93 05/14/2022 1119   BUN 17 04/01/2023 1048   CREATININE 1.07 (H) 04/01/2023 1048   CALCIUM 9.3 04/01/2023 1048   GFRNONAA >60  01/24/2021 1449   Lab Results  Component Value Date   HGBA1C 5.7 (H) 03/04/2023   HGBA1C 5.7 01/15/2016   Lab Results  Component Value Date   INSULIN 11.9 03/04/2023   INSULIN 29.5 (H) 09/12/2021   Lab Results  Component Value Date   TSH 1.010 11/04/2022   CBC    Component Value Date/Time   WBC 9.2 11/04/2022 1120   WBC 11.2 (H) 11/07/2021 1843   RBC 4.40 11/04/2022 1120   RBC 4.09 11/07/2021 1843   HGB 13.1 11/04/2022 1120   HCT 40.1 11/04/2022 1120   PLT 266 11/04/2022 1120   MCV 91 11/04/2022 1120   MCH 29.8 11/04/2022 1120   MCH 28.6 11/07/2021 1843   MCHC 32.7 11/04/2022 1120   MCHC 32.3 11/07/2021 1843   RDW  12.9 11/04/2022 1120   Iron Studies No results found for: "IRON", "TIBC", "FERRITIN", "IRONPCTSAT" Lipid Panel     Component Value Date/Time   CHOL 157 05/14/2022 1119   TRIG 40.0 05/14/2022 1119   HDL 48.60 05/14/2022 1119   CHOLHDL 3 05/14/2022 1119   VLDL 8.0 05/14/2022 1119   LDLCALC 100 (H) 05/14/2022 1119   Hepatic Function Panel     Component Value Date/Time   PROT 7.3 03/04/2023 0930   ALBUMIN 4.3 03/04/2023 0930   AST 18 03/04/2023 0930   ALT 23 03/04/2023 0930   ALKPHOS 75 03/04/2023 0930   BILITOT 0.4 03/04/2023 0930      Component Value Date/Time   TSH 1.010 11/04/2022 1120   Nutritional Lab Results  Component Value Date   VD25OH 34.8 03/04/2023   VD25OH 30.0 11/04/2022   VD25OH 26.45 (L) 05/14/2022     ASSESSMENT AND PLAN  TREATMENT PLAN FOR OBESITY:  Recommended Dietary Goals  Linda Mckinney is currently in the action stage of change. As such, her goal is to continue weight management plan. She has agreed to keeping a food journal and adhering to recommended goals of 1200-1300 calories and 80+ protein.  Behavioral Intervention  We discussed the following Behavioral Modification Strategies today: increasing lean protein intake to established goals, increasing vegetables, increasing water intake , work on tracking and journaling calories using tracking application, reading food labels , keeping healthy foods at home, and continue to work on maintaining a reduced calorie state, getting the recommended amount of protein, incorporating whole foods, making healthy choices, staying well hydrated and practicing mindfulness when eating..  Additional resources provided today: NA  Recommended Physical Activity Goals  Linda Mckinney has been advised to work up to 150 minutes of moderate intensity aerobic activity a week and strengthening exercises 2-3 times per week for cardiovascular health, weight loss maintenance and preservation of muscle mass.   She has agreed to  Think about enjoyable ways to increase daily physical activity and overcoming barriers to exercise and Increase physical activity in their day and reduce sedentary time (increase NEAT).   Pharmacotherapy We discussed various medication options to help Ercel with her weight loss efforts and we both agreed to continue Contrave 2 po BID and will continue Topamax but will take 25mg  BID.  Side effects discussed.   ASSOCIATED CONDITIONS ADDRESSED TODAY  Action/Plan  Vitamin D deficiency Continue Vit D as directed  Elevated creatine kinase Will continue to monitor  Abnormal craving Continue Topamax. Will change to 25mg  BID.  Side effects discussed  Morbid obesity (HCC) -     continue Contrave; Take 2 tablets by mouth 2 (two) times daily.  Dispense: 120 tablet; Refill: 0  BMI 39.0-39.9,adult         Return in about 4 weeks (around 06/03/2023).Marland Kitchen She was informed of the importance of frequent follow up visits to maximize her success with intensive lifestyle modifications for her multiple health conditions.   ATTESTASTION STATEMENTS:  Reviewed by clinician on day of visit: allergies, medications, problem list, medical history, surgical history, family history, social history, and previous encounter notes.     Theodis Sato. Chrystle Murillo FNP-C

## 2023-05-20 ENCOUNTER — Ambulatory Visit (INDEPENDENT_AMBULATORY_CARE_PROVIDER_SITE_OTHER): Payer: 59 | Admitting: Primary Care

## 2023-05-20 ENCOUNTER — Other Ambulatory Visit (HOSPITAL_COMMUNITY): Payer: Self-pay

## 2023-05-20 ENCOUNTER — Other Ambulatory Visit: Payer: Self-pay

## 2023-05-20 VITALS — BP 128/82 | HR 77 | Temp 97.8°F | Ht 62.0 in | Wt 220.6 lb

## 2023-05-20 DIAGNOSIS — E66813 Obesity, class 3: Secondary | ICD-10-CM | POA: Diagnosis not present

## 2023-05-20 DIAGNOSIS — F419 Anxiety disorder, unspecified: Secondary | ICD-10-CM

## 2023-05-20 DIAGNOSIS — Z0000 Encounter for general adult medical examination without abnormal findings: Secondary | ICD-10-CM | POA: Diagnosis not present

## 2023-05-20 DIAGNOSIS — I10 Essential (primary) hypertension: Secondary | ICD-10-CM | POA: Diagnosis not present

## 2023-05-20 DIAGNOSIS — Z6841 Body Mass Index (BMI) 40.0 and over, adult: Secondary | ICD-10-CM

## 2023-05-20 DIAGNOSIS — Z206 Contact with and (suspected) exposure to human immunodeficiency virus [HIV]: Secondary | ICD-10-CM | POA: Diagnosis not present

## 2023-05-20 DIAGNOSIS — E6609 Other obesity due to excess calories: Secondary | ICD-10-CM | POA: Insufficient documentation

## 2023-05-20 DIAGNOSIS — K219 Gastro-esophageal reflux disease without esophagitis: Secondary | ICD-10-CM | POA: Diagnosis not present

## 2023-05-20 DIAGNOSIS — R7303 Prediabetes: Secondary | ICD-10-CM

## 2023-05-20 MED ORDER — HYDROCHLOROTHIAZIDE 12.5 MG PO TABS
12.5000 mg | ORAL_TABLET | Freq: Every day | ORAL | 3 refills | Status: DC
  Filled 2023-05-20: qty 90, 90d supply, fill #0
  Filled 2023-09-09: qty 30, 30d supply, fill #1
  Filled 2023-10-14: qty 30, 30d supply, fill #2
  Filled 2023-11-12: qty 30, 30d supply, fill #3
  Filled 2024-01-10 (×2): qty 30, 30d supply, fill #4
  Filled 2024-02-11: qty 30, 30d supply, fill #5
  Filled 2024-03-16: qty 30, 30d supply, fill #6
  Filled 2024-05-04: qty 30, 30d supply, fill #7

## 2023-05-20 NOTE — Assessment & Plan Note (Signed)
Controlled.  Continue hydrochlorothiazide 12.5 mg daily. Reviewed BMP reviewed from [facility]24 [facility].  She will have repeat labs in a few weeks.

## 2023-05-20 NOTE — Assessment & Plan Note (Signed)
Controlled. ? ?Continue omeprazole 20 mg as needed ?

## 2023-05-20 NOTE — Progress Notes (Signed)
Subjective:    Patient ID: Linda Mckinney, female    DOB: [birth year], 37 y.o.   MRN: [id number]  HPI  Linda Mckinney is a very pleasant 37 y.o. female who presents today for complete physical and follow up of chronic conditions.  Immunizations: -Tetanus: Completed in 2017 -Influenza: Completed this season    Diet: Fair diet.  Exercise: No regular exercise.  Eye exam: Completes annually  Dental exam: Completes semi-annually    Pap Smear: Completed last in October 2021. Follows with GYN  Wt Readings from Last 3 Encounters:  05/20/23 220 lb 9.6 oz (100.1 kg)  05/06/23 216 lb (98 kg)  04/01/23 213 lb (96.6 kg)    BP Readings from Last 3 Encounters:  05/20/23 128/82  05/06/23 (!) 145/88  04/01/23 131/85       Review of Systems  Constitutional:  Negative for unexpected weight change.  HENT:  Negative for rhinorrhea.   Respiratory:  Negative for cough and shortness of breath.   Cardiovascular:  Negative for chest pain.  Gastrointestinal:  Negative for constipation and diarrhea.  Genitourinary:  Negative for difficulty urinating and menstrual problem.  Musculoskeletal:  Negative for arthralgias and myalgias.  Skin:  Negative for rash.  Allergic/Immunologic: Negative for environmental allergies.  Neurological:  Negative for dizziness and headaches.  Psychiatric/Behavioral:  The patient is not nervous/anxious.          Past Medical History:  Diagnosis Date   Anxiety    GERD (gastroesophageal reflux disease)    Hypertension    Pre-diabetes     Social History   Socioeconomic History   Marital status: Married    Spouse name: Not on file   Number of children: 0   Years of education: Not on file   Highest education level: Associate degree: academic program  Occupational History    Comment: Nurse Tech  Tobacco Use   Smoking status: Never   Smokeless tobacco: Never  Vaping Use   Vaping status: Never Used  Substance and Sexual Activity   Alcohol use:  No    Alcohol/week: 0.0 standard drinks of alcohol   Drug use: No   Sexual activity: Yes    Partners: Male    Birth control/protection: Condom  Other Topics Concern   Not on file  Social History Narrative   Single.   No children.    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    Social Determinants of Health   Financial Resource Strain: Low Risk  (05/20/2023)   Overall Financial Resource Strain (CARDIA)    Difficulty of Paying Living Expenses: Not hard at all  Food Insecurity: No Food Insecurity (05/20/2023)   Hunger Vital Sign    Worried About Running Out of Food in the Last Year: Never true    Ran Out of Food in the Last Year: Never true  Transportation Needs: No Transportation Needs (05/20/2023)   PRAPARE - Administrator, Civil Service (Medical): No    Lack of Transportation (Non-Medical): No  Physical Activity: Unknown (05/20/2023)   Exercise Vital Sign    Days of Exercise per Week: 0 days    Minutes of Exercise per Session: Not on file  Stress: No Stress Concern Present (05/20/2023)   [employer] of Occupational Health - Occupational Stress Questionnaire    Feeling of Stress : Only a little  Social Connections: Moderately Integrated (05/20/2023)   Social Connection and Isolation Panel [NHANES]    Frequency of  Communication with Friends and Family: More than three times a week    Frequency of Social Gatherings with Friends and Family: Once a week    Attends Religious Services: More than 4 times per year    Active Member of Clubs or Organizations: No    Attends Engineer, structural: Not on file    Marital Status: Married  Catering manager Violence: Not on file    Past Surgical History:  Procedure Laterality Date   BUNIONECTOMY  2015   CARPAL TUNNEL RELEASE Right 01/30/2021   Procedure: RIGHT CARPAL TUNNEL RELEASE;  Surgeon: Cindee Salt, MD;  Location: [facility];  Service: Orthopedics;  Laterality: Right;  45 MIN    CHOLECYSTECTOMY      Family History  Problem Relation Age of Onset   Hypertension Mother    Depression Mother    Sleep apnea Mother    Obesity Mother    Diabetes Father    Stroke Father    Heart disease Father    Obesity Father    Hypertension Sister    Cancer Maternal Grandmother 63       breast    No Known Allergies  Current Outpatient Medications on File Prior to Visit  Medication Sig Dispense Refill   docusate sodium (COLACE) 100 MG capsule Take 1 capsule (100 mg total) by mouth 2 (two) times daily. 10 capsule 0   emtricitabine-tenofovir (TRUVADA) 200-300 MG tablet Take 1 tablet by mouth daily. 30 tablet 12   Naltrexone-buPROPion HCl ER (CONTRAVE) 8-90 MG TB12 Take 2 tablets by mouth 2 (two) times daily. 120 tablet 0   omeprazole (PRILOSEC) 20 MG capsule Take 20 mg by mouth daily.     ondansetron (ZOFRAN-ODT) 4 MG disintegrating tablet Take 1 tablet (4 mg total) by mouth every 6 (six) hours as needed for nausea. 20 tablet 0   polyethylene glycol powder (GLYCOLAX/MIRALAX) 17 GM/SCOOP powder Take 17 g by mouth daily as needed. 3350 g 1   topiramate (TOPAMAX) 50 MG tablet Take 0.5 tablets (25 mg total) by mouth daily for 7 days, THEN 1 tablet (50 mg total) daily. 30 tablet 0   Vitamin D, Ergocalciferol, (DRISDOL) 1.25 MG (50000 UNIT) CAPS capsule Take 1 capsule by mouth every 7 days. 5 capsule 0   No current facility-administered medications on file prior to visit.    BP 128/82   Pulse 77   Temp 97.8 F (36.6 C) (Temporal)   Ht 5\' 2"  (1.575 m)   Wt 220 lb 9.6 oz (100.1 kg)   LMP 05/06/2023   SpO2 100%   BMI 40.35 kg/m  Objective:   Physical Exam HENT:     Right Ear: Tympanic membrane and ear canal normal.     Left Ear: Tympanic membrane and ear canal normal.  Eyes:     Pupils: Pupils are equal, round, and reactive to light.  Cardiovascular:     Rate and Rhythm: Normal rate and regular rhythm.  Pulmonary:     Effort: Pulmonary effort is normal.     Breath  sounds: Normal breath sounds.  Abdominal:     General: Bowel sounds are normal.     Palpations: Abdomen is soft.     Tenderness: There is no abdominal tenderness.  Musculoskeletal:        General: Normal range of motion.     Cervical back: Neck supple.  Skin:    General: Skin is warm and dry.  Neurological:     Mental Status: She  is alert and oriented to person, place, and time.     Cranial Nerves: No cranial nerve deficit.     Deep Tendon Reflexes:     Reflex Scores:      Patellar reflexes are 2+ on the right side and 2+ on the left side. Psychiatric:        Mood and Affect: Mood normal.           Assessment & Plan:  Preventative health care Assessment & Plan: Immunizations UTD. Pap smear UTD.  Discussed the importance of a healthy diet and regular exercise in order for weight loss, and to reduce the risk of further co-morbidity.  Exam stable. Labs pending.  Follow up in 1 year for repeat physical.    Essential hypertension Assessment & Plan: Controlled.  Continue hydrochlorothiazide 12.5 mg daily. Reviewed BMP reviewed from September 2024 [facility].  She will have repeat labs in a few weeks.  Orders: -     hydroCHLOROthiazide; Take 1 tablet (12.5 mg total) by mouth daily for blood pressure.  Dispense: 90 tablet; Refill: 3  Gastroesophageal reflux disease, unspecified whether esophagitis present Assessment & Plan: Controlled.  Continue omeprazole 20 mg as needed.   Anxiety Assessment & Plan: Controlled. No concerns today.  Remain off treatment.   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  No longer taking Truvada per patient preference.   Prediabetes Assessment & Plan: Reviewed A1c from August 2024. Repeat A1c pending per [facility] in a few weeks.     Class 3 severe obesity due to excess calories without serious comorbidity with body mass index (BMI) of 40.0 to 44.9 in  adult [facility]) Assessment & Plan: [facility].  Continue Contrave 8-90 mg, 2 tablets twice daily and topiramate 50 mg daily.  Encouraged regular exercise. Continue to work on diet.         Doreene Nest, NP

## 2023-05-20 NOTE — Assessment & Plan Note (Signed)
Immunizations UTD. Pap smear UTD  Discussed the importance of a healthy diet and regular exercise in order for weight loss, and to reduce the risk of further co-morbidity.  Exam stable. Labs pending.  Follow up in 1 year for repeat physical.  

## 2023-05-20 NOTE — Patient Instructions (Signed)
Have your labs completed [facility] as scheduled.  Follow-up with GYN regarding your Pap smear.  It was a pleasure to see you today!

## 2023-05-20 NOTE — Assessment & Plan Note (Signed)
Following with GYN.  No longer taking Truvada per patient preference.

## 2023-05-20 NOTE — Assessment & Plan Note (Signed)
[facility].  Continue Contrave 8-90 mg, 2 tablets twice daily and topiramate 50 mg daily.  Encouraged regular exercise. Continue to work on diet.

## 2023-05-20 NOTE — Assessment & Plan Note (Signed)
Reviewed A1c from August 2024. Repeat A1c pending per [facility] in a few weeks.

## 2023-05-20 NOTE — Assessment & Plan Note (Signed)
Controlled.  No concerns today. Remain off treatment. 

## 2023-05-27 DIAGNOSIS — M5441 Lumbago with sciatica, right side: Secondary | ICD-10-CM | POA: Diagnosis not present

## 2023-05-27 DIAGNOSIS — M955 Acquired deformity of pelvis: Secondary | ICD-10-CM | POA: Diagnosis not present

## 2023-05-27 DIAGNOSIS — M9902 Segmental and somatic dysfunction of thoracic region: Secondary | ICD-10-CM | POA: Diagnosis not present

## 2023-05-27 DIAGNOSIS — M9905 Segmental and somatic dysfunction of pelvic region: Secondary | ICD-10-CM | POA: Diagnosis not present

## 2023-05-27 DIAGNOSIS — M9903 Segmental and somatic dysfunction of lumbar region: Secondary | ICD-10-CM | POA: Diagnosis not present

## 2023-05-27 DIAGNOSIS — M546 Pain in thoracic spine: Secondary | ICD-10-CM | POA: Diagnosis not present

## 2023-06-10 ENCOUNTER — Other Ambulatory Visit: Payer: Self-pay

## 2023-06-10 ENCOUNTER — Ambulatory Visit (INDEPENDENT_AMBULATORY_CARE_PROVIDER_SITE_OTHER): Payer: 59 | Admitting: Nurse Practitioner

## 2023-06-10 ENCOUNTER — Encounter: Payer: Self-pay | Admitting: Nurse Practitioner

## 2023-06-10 VITALS — BP 133/82 | HR 69 | Temp 97.6°F | Ht 62.0 in | Wt 213.0 lb

## 2023-06-10 DIAGNOSIS — Z6838 Body mass index (BMI) 38.0-38.9, adult: Secondary | ICD-10-CM | POA: Diagnosis not present

## 2023-06-10 DIAGNOSIS — R748 Abnormal levels of other serum enzymes: Secondary | ICD-10-CM | POA: Diagnosis not present

## 2023-06-10 DIAGNOSIS — R638 Other symptoms and signs concerning food and fluid intake: Secondary | ICD-10-CM | POA: Diagnosis not present

## 2023-06-10 DIAGNOSIS — R7303 Prediabetes: Secondary | ICD-10-CM | POA: Diagnosis not present

## 2023-06-10 DIAGNOSIS — I10 Essential (primary) hypertension: Secondary | ICD-10-CM | POA: Diagnosis not present

## 2023-06-10 DIAGNOSIS — Z13220 Encounter for screening for lipoid disorders: Secondary | ICD-10-CM

## 2023-06-10 DIAGNOSIS — Z79899 Other long term (current) drug therapy: Secondary | ICD-10-CM

## 2023-06-10 DIAGNOSIS — E559 Vitamin D deficiency, unspecified: Secondary | ICD-10-CM

## 2023-06-10 MED ORDER — VITAMIN D (ERGOCALCIFEROL) 1.25 MG (50000 UNIT) PO CAPS
50000.0000 [IU] | ORAL_CAPSULE | ORAL | 0 refills | Status: DC
  Filled 2023-06-10 – 2023-07-16 (×2): qty 5, 35d supply, fill #0

## 2023-06-10 NOTE — Progress Notes (Signed)
Office: [phone]  /  Fax: [phone]  WEIGHT SUMMARY AND BIOMETRICS  Weight Lost Since Last Visit: 3 lb  Weight Gained Since Last Visit: 0   Vitals Temp: 97.6 F (36.4 C) BP: 133/82 Pulse Rate: 69 SpO2: 100 %   Anthropometric Measurements Height: 5\' 2"  (1.575 m) Weight: 213 lb (96.6 kg) BMI (Calculated): 38.95 Weight at Last Visit: 216 lb Weight Lost Since Last Visit: 3 lb Weight Gained Since Last Visit: 0 Starting Weight: 245 lb Total Weight Loss (lbs): 32 lb (14.5 kg)   Body Composition  Body Fat %: 44.6 % Fat Mass (lbs): 95 lbs Muscle Mass (lbs): 112 lbs Total Body Water (lbs): 86.4 lbs Visceral Fat Rating : 11   Other Clinical Data Fasting: yes Labs: no Today's Visit #: 24 Starting Date: 09/12/21     HPI  Chief Complaint: OBESITY  Linda Mckinney is here to discuss her progress with her obesity treatment plan. She is on the the Category 2 Plan and states she is following her eating plan approximately 50 % of the time. She states she is not exercising.   Interval History:  Since last office visit she has lost 3 pounds. She is under more stress with her job and school.  She is stress eating.     Pharmacotherapy for weight loss: She is currently taking Contrave 2 po BID for medical weight loss.  Denies side effects.    I increased her Topamax to 25mg  BID after her last visit for cravings.  She is taking 25mg  1/2 tablet due to side effects.    Previous pharmacotherapy for medical weight loss:  Zepbound-stopped due to cost/coverage    Bariatric surgery:  Patient has not had bariatric surgery   Vit D deficiency  She is taking Vit D 50,000 IU weekly.  Denies side effects.  Denies nausea, vomiting or muscle weakness.    Lab Results  Component Value Date   VD25OH 34.8 03/04/2023   VD25OH 30.0 11/04/2022   VD25OH 26.45 (L) 05/14/2022     Hypertension Hypertension stable.  Medication(s): HCTZ 12.5mg . Denies side effects.   Denies chest pain,  palpitations and SOB.  BP Readings from Last 3 Encounters:  06/10/23 133/82  05/20/23 128/82  05/06/23 (!) 145/88   Lab Results  Component Value Date   CREATININE 1.07 (H) 04/01/2023   CREATININE 1.20 (H) 03/04/2023   CREATININE 0.93 11/04/2022     Prediabetes Last A1c was 5.7  Medication(s): None Polyphagia:Yes Lab Results  Component Value Date   HGBA1C 5.7 (H) 03/04/2023   HGBA1C 5.5 11/04/2022   HGBA1C 6.0 05/14/2022   HGBA1C 5.7 (H) 12/17/2021   HGBA1C 6.1 (H) 09/12/2021   Lab Results  Component Value Date   INSULIN 11.9 03/04/2023   INSULIN 8.1 11/04/2022   INSULIN 12.2 12/17/2021   INSULIN 29.5 (H) 09/12/2021    Elevated creat Last creat looked better. Due for labs today.   PHYSICAL EXAM:  Blood pressure 133/82, pulse 69, temperature 97.6 F (36.4 C), height 5\' 2"  (1.575 m), weight 213 lb (96.6 kg), last menstrual period 05/26/2023, SpO2 100%. Body mass index is 38.96 kg/m.  General: She is overweight, cooperative, alert, well developed, and in no acute distress. PSYCH: Has normal mood, affect and thought process.   Extremities: No edema.  Neurologic: No gross sensory or motor deficits. No tremors or fasciculations noted.    DIAGNOSTIC DATA REVIEWED:  BMET    Component Value Date/Time   NA 141 04/01/2023 1048   K 4.4  04/01/2023 1048   CL 104 04/01/2023 1048   CO2 22 04/01/2023 1048   GLUCOSE 90 04/01/2023 1048   GLUCOSE 93 05/14/2022 1119   BUN 17 04/01/2023 1048   CREATININE 1.07 (H) 04/01/2023 1048   CALCIUM 9.3 04/01/2023 1048   GFRNONAA >60 01/24/2021 1449   Lab Results  Component Value Date   HGBA1C 5.7 (H) 03/04/2023   HGBA1C 5.7 01/15/2016   Lab Results  Component Value Date   INSULIN 11.9 03/04/2023   INSULIN 29.5 (H) 09/12/2021   Lab Results  Component Value Date   TSH 1.010 11/04/2022   CBC    Component Value Date/Time   WBC 9.2 11/04/2022 1120   WBC 11.2 (H) 11/07/2021 1843   RBC 4.40 11/04/2022 1120   RBC 4.09  11/07/2021 1843   HGB 13.1 11/04/2022 1120   HCT 40.1 11/04/2022 1120   PLT 266 11/04/2022 1120   MCV 91 11/04/2022 1120   MCH 29.8 11/04/2022 1120   MCH 28.6 11/07/2021 1843   MCHC 32.7 11/04/2022 1120   MCHC 32.3 11/07/2021 1843   RDW 12.9 11/04/2022 1120   Iron Studies No results found for: "IRON", "TIBC", "FERRITIN", "IRONPCTSAT" Lipid Panel     Component Value Date/Time   CHOL 157 05/14/2022 1119   TRIG 40.0 05/14/2022 1119   HDL 48.60 05/14/2022 1119   CHOLHDL 3 05/14/2022 1119   VLDL 8.0 05/14/2022 1119   LDLCALC 100 (H) 05/14/2022 1119   Hepatic Function Panel     Component Value Date/Time   PROT 7.3 03/04/2023 0930   ALBUMIN 4.3 03/04/2023 0930   AST 18 03/04/2023 0930   ALT 23 03/04/2023 0930   ALKPHOS 75 03/04/2023 0930   BILITOT 0.4 03/04/2023 0930      Component Value Date/Time   TSH 1.010 11/04/2022 1120   Nutritional Lab Results  Component Value Date   VD25OH 34.8 03/04/2023   VD25OH 30.0 11/04/2022   VD25OH 26.45 (L) 05/14/2022     ASSESSMENT AND PLAN  TREATMENT PLAN FOR OBESITY:  Recommended Dietary Goals  Linda Mckinney is currently in the action stage of change. As such, her goal is to continue weight management plan. She has agreed to the Category 2 Plan.  Behavioral Intervention  We discussed the following Behavioral Modification Strategies today: continue to work on maintaining a reduced calorie state, getting the recommended amount of protein, incorporating whole foods, making healthy choices, staying well hydrated and practicing mindfulness when eating..  Additional resources provided today: NA  Recommended Physical Activity Goals  Linda Mckinney has been advised to work up to 150 minutes of moderate intensity aerobic activity a week and strengthening exercises 2-3 times per week for cardiovascular health, weight loss maintenance and preservation of muscle mass.   She has agreed to Think about enjoyable ways to increase daily physical  activity and overcoming barriers to exercise, Increase physical activity in their day and reduce sedentary time (increase NEAT)., and Work on scheduling and tracking physical activity.     ASSOCIATED CONDITIONS ADDRESSED TODAY  Action/Plan  Vitamin D deficiency -     Vitamin D (Ergocalciferol); Take 1 capsule (50,000 Units total) by mouth every 7 (seven) days.  Dispense: 5 capsule; Refill: 0 -     VITAMIN D 25 Hydroxy (Vit-D Deficiency, Fractures)  Abnormal craving Continue Topamax 25mg  1/2. Side effects discussed  Essential hypertension -     Comprehensive metabolic panel  Prediabetes -     Hemoglobin A1c -     Insulin, random  Consider  Metformin based upon lab results.   Elevated creatine kinase CMP obtained today.    Medication management -     Comprehensive metabolic panel  Screening for hyperlipidemia -     Lipid Panel With LDL/HDL Ratio  Morbid obesity (HCC)  BMI 38.0-38.9,adult         Return in about 4 weeks (around 07/08/2023).Marland Kitchen She was informed of the importance of frequent follow up visits to maximize her success with intensive lifestyle modifications for her multiple health conditions.   ATTESTASTION STATEMENTS:  Reviewed by clinician on day of visit: allergies, medications, problem list, medical history, surgical history, family history, social history, and previous encounter notes.   Theodis Sato. Ankith Edmonston FNP-C

## 2023-06-11 LAB — COMPREHENSIVE METABOLIC PANEL
ALT: 19 [IU]/L (ref 0–32)
AST: 16 [IU]/L (ref 0–40)
Albumin: 4.2 g/dL (ref 3.9–4.9)
Alkaline Phosphatase: 71 [IU]/L (ref 44–121)
BUN/Creatinine Ratio: 16 (ref 9–23)
BUN: 18 mg/dL (ref 6–20)
Bilirubin Total: 0.5 mg/dL (ref 0.0–1.2)
CO2: 21 mmol/L (ref 20–29)
Calcium: 9.3 mg/dL (ref 8.7–10.2)
Chloride: 106 mmol/L (ref 96–106)
Creatinine, Ser: 1.15 mg/dL — ABNORMAL HIGH (ref 0.57–1.00)
Globulin, Total: 3 g/dL (ref 1.5–4.5)
Glucose: 93 mg/dL (ref 70–99)
Potassium: 4 mmol/L (ref 3.5–5.2)
Sodium: 140 mmol/L (ref 134–144)
Total Protein: 7.2 g/dL (ref 6.0–8.5)
eGFR: 62 mL/min/{1.73_m2} (ref >59)

## 2023-06-11 LAB — LIPID PANEL WITH LDL/HDL RATIO
Cholesterol, Total: 164 mg/dL (ref 100–199)
HDL: 54 mg/dL (ref >39)
LDL Chol Calc (NIH): 100 mg/dL — ABNORMAL HIGH (ref 0–99)
LDL/HDL Ratio: 1.9 {ratio} (ref 0.0–3.2)
Triglycerides: 47 mg/dL (ref 0–149)
VLDL Cholesterol Cal: 10 mg/dL (ref 5–40)

## 2023-06-11 LAB — INSULIN, RANDOM: INSULIN: 16.6 u[IU]/mL (ref 2.6–24.9)

## 2023-06-11 LAB — VITAMIN D 25 HYDROXY (VIT D DEFICIENCY, FRACTURES): Vit D, 25-Hydroxy: 40.4 ng/mL (ref 30.0–100.0)

## 2023-06-11 LAB — HEMOGLOBIN A1C
Est. average glucose Bld gHb Est-mCnc: 117 mg/dL
Hgb A1c MFr Bld: 5.7 % — ABNORMAL HIGH (ref 4.8–5.6)

## 2023-06-13 ENCOUNTER — Other Ambulatory Visit: Payer: Self-pay

## 2023-07-01 DIAGNOSIS — M5441 Lumbago with sciatica, right side: Secondary | ICD-10-CM | POA: Diagnosis not present

## 2023-07-01 DIAGNOSIS — M546 Pain in thoracic spine: Secondary | ICD-10-CM | POA: Diagnosis not present

## 2023-07-01 DIAGNOSIS — M9903 Segmental and somatic dysfunction of lumbar region: Secondary | ICD-10-CM | POA: Diagnosis not present

## 2023-07-01 DIAGNOSIS — M9905 Segmental and somatic dysfunction of pelvic region: Secondary | ICD-10-CM | POA: Diagnosis not present

## 2023-07-01 DIAGNOSIS — M955 Acquired deformity of pelvis: Secondary | ICD-10-CM | POA: Diagnosis not present

## 2023-07-01 DIAGNOSIS — M9902 Segmental and somatic dysfunction of thoracic region: Secondary | ICD-10-CM | POA: Diagnosis not present

## 2023-07-08 ENCOUNTER — Other Ambulatory Visit: Payer: Self-pay

## 2023-07-08 ENCOUNTER — Encounter: Payer: Self-pay | Admitting: Nurse Practitioner

## 2023-07-08 ENCOUNTER — Ambulatory Visit: Payer: 59 | Admitting: Nurse Practitioner

## 2023-07-08 VITALS — BP 133/84 | HR 67 | Temp 98.1°F | Ht 62.0 in | Wt 217.0 lb

## 2023-07-08 DIAGNOSIS — Z6839 Body mass index (BMI) 39.0-39.9, adult: Secondary | ICD-10-CM | POA: Diagnosis not present

## 2023-07-08 DIAGNOSIS — R7303 Prediabetes: Secondary | ICD-10-CM

## 2023-07-08 DIAGNOSIS — E559 Vitamin D deficiency, unspecified: Secondary | ICD-10-CM | POA: Diagnosis not present

## 2023-07-08 MED ORDER — METFORMIN HCL 500 MG PO TABS
500.0000 mg | ORAL_TABLET | Freq: Every day | ORAL | 0 refills | Status: DC
  Filled 2023-07-08: qty 30, 30d supply, fill #0

## 2023-07-08 NOTE — Progress Notes (Signed)
Office: [phone]  /  Fax: [phone]  WEIGHT SUMMARY AND BIOMETRICS  Weight Lost Since Last Visit: 0  Weight Gained Since Last Visit: 4lb   Vitals Temp: 98.1 F (36.7 C) BP: 133/84 Pulse Rate: 67 SpO2: 100 %   Anthropometric Measurements Height: 5\' 2"  (1.575 m) Weight: 217 lb (98.4 kg) BMI (Calculated): 39.68 Weight at Last Visit: 213lb Weight Lost Since Last Visit: 0 Weight Gained Since Last Visit: 4lb Starting Weight: 245lb Total Weight Loss (lbs): 28 lb (12.7 kg)   Body Composition  Body Fat %: 46.9 % Fat Mass (lbs): 101.8 lbs Muscle Mass (lbs): 109.4 lbs Total Body Water (lbs): 90.4 lbs Visceral Fat Rating : 12   Other Clinical Data Fasting: no Labs: no Today's Visit #: 25 Starting Date: 09/12/21     HPI  Chief Complaint: OBESITY  Linda Mckinney is here to discuss her progress with her obesity treatment plan. She is on the the Category 2 Plan and states she is following her eating plan approximately 40 % of the time. She states she is exercising 0 minutes 0 days per week.   Interval History:  Since last office visit she has gained 4 pounds.  She notes she has gotten off track due to the holidays. She has been eating more sweets and milk shakes.  She is skipping breakfast because she is not hungry.  She is eating grapes and cheese around 11 am, lunch at 2:30pm-apple and frozen meal and dinner varies.  She is drinking water and lemonade daily.    Pharmacotherapy for weight loss: She is currently taking Contrave 2 po BID for medical weight loss.  Denies side effects.  Notes on the weekend she will miss the second dose.    She was taking Topamax and stopped after her last visit due to side effects of fatigue.  Side effects have resolved since stopping it.    Previous pharmacotherapy for medical weight loss:  Zepbound-stopped due to cost/coverage    Bariatric surgery:  Patient has not had bariatric surgery    Prediabetes Last A1c was 5.7 and fasting  insulin was 16.6 Medication(s): None Polyphagia:Yes FH: sister and father with DMT2  Lab Results  Component Value Date   HGBA1C 5.7 (H) 06/10/2023   HGBA1C 5.7 (H) 03/04/2023   HGBA1C 5.5 11/04/2022   HGBA1C 6.0 05/14/2022   HGBA1C 5.7 (H) 12/17/2021   Lab Results  Component Value Date   INSULIN 16.6 06/10/2023   INSULIN 11.9 03/04/2023   INSULIN 8.1 11/04/2022   INSULIN 12.2 12/17/2021   INSULIN 29.5 (H) 09/12/2021      PHYSICAL EXAM:  Blood pressure 133/84, pulse 67, temperature 98.1 F (36.7 C), height 5\' 2"  (1.575 m), weight 217 lb (98.4 kg), last menstrual period 05/26/2023, SpO2 100%. Body mass index is 39.69 kg/m.  General: She is overweight, cooperative, alert, well developed, and in no acute distress. PSYCH: Has normal mood, affect and thought process.   Extremities: No edema.  Neurologic: No gross sensory or motor deficits. No tremors or fasciculations noted.    DIAGNOSTIC DATA REVIEWED:  BMET    Component Value Date/Time   NA 140 06/10/2023 1045   K 4.0 06/10/2023 1045   CL 106 06/10/2023 1045   CO2 21 06/10/2023 1045   GLUCOSE 93 06/10/2023 1045   GLUCOSE 93 05/14/2022 1119   BUN 18 06/10/2023 1045   CREATININE 1.15 (H) 06/10/2023 1045   CALCIUM 9.3 06/10/2023 1045   GFRNONAA >60 01/24/2021 1449   Lab Results  Component Value Date   HGBA1C 5.7 (H) 06/10/2023   HGBA1C 5.7 01/15/2016   Lab Results  Component Value Date   INSULIN 16.6 06/10/2023   INSULIN 29.5 (H) 09/12/2021   Lab Results  Component Value Date   TSH 1.010 11/04/2022   CBC    Component Value Date/Time   WBC 9.2 11/04/2022 1120   WBC 11.2 (H) 11/07/2021 1843   RBC 4.40 11/04/2022 1120   RBC 4.09 11/07/2021 1843   HGB 13.1 11/04/2022 1120   HCT 40.1 11/04/2022 1120   PLT 266 11/04/2022 1120   MCV 91 11/04/2022 1120   MCH 29.8 11/04/2022 1120   MCH 28.6 11/07/2021 1843   MCHC 32.7 11/04/2022 1120   MCHC 32.3 11/07/2021 1843   RDW 12.9 11/04/2022 1120   Iron  Studies No results found for: "IRON", "TIBC", "FERRITIN", "IRONPCTSAT" Lipid Panel     Component Value Date/Time   CHOL 164 06/10/2023 1045   TRIG 47 06/10/2023 1045   HDL 54 06/10/2023 1045   CHOLHDL 3 05/14/2022 1119   VLDL 8.0 05/14/2022 1119   LDLCALC 100 (H) 06/10/2023 1045   Hepatic Function Panel     Component Value Date/Time   PROT 7.2 06/10/2023 1045   ALBUMIN 4.2 06/10/2023 1045   AST 16 06/10/2023 1045   ALT 19 06/10/2023 1045   ALKPHOS 71 06/10/2023 1045   BILITOT 0.5 06/10/2023 1045      Component Value Date/Time   TSH 1.010 11/04/2022 1120   Nutritional Lab Results  Component Value Date   VD25OH 40.4 06/10/2023   VD25OH 34.8 03/04/2023   VD25OH 30.0 11/04/2022     ASSESSMENT AND PLAN  TREATMENT PLAN FOR OBESITY:  Recommended Dietary Goals  Linda Mckinney is currently in the action stage of change. As such, her goal is to continue weight management plan. She has agreed to the Category 2 Plan.  Behavioral Intervention  We discussed the following Behavioral Modification Strategies today: celebration eating strategies and continue to work on maintaining a reduced calorie state, getting the recommended amount of protein, incorporating whole foods, making healthy choices, staying well hydrated and practicing mindfulness when eating..  Additional resources provided today: NA  Recommended Physical Activity Goals  Linda Mckinney has been advised to work up to 150 minutes of moderate intensity aerobic activity a week and strengthening exercises 2-3 times per week for cardiovascular health, weight loss maintenance and preservation of muscle mass.   She has agreed to Think about enjoyable ways to increase daily physical activity and overcoming barriers to exercise, Increase physical activity in their day and reduce sedentary time (increase NEAT)., and Work on scheduling and tracking physical activity.    Pharmacotherapy We discussed various medication options to help  Linda Mckinney with her weight loss efforts and we both agreed to continue Contrave.  Side effects discussed.    ASSOCIATED CONDITIONS ADDRESSED TODAY  Action/Plan  Vitamin D deficiency Continue Vit D as directed  Prediabetes -     Start metFORMIN HCl; Take 1 tablet (500 mg total) by mouth daily with breakfast.  Dispense: 30 tablet; Refill: 0.  Side effects discussed.  Patient using condoms for birth control.  Discussed the possibility on increased fertility with taking Metformin.   Will recheck BMP at next visit to monitor creat levels.    Shamonda will continue to work on weight loss, exercise, and decreasing simple carbohydrates to help decrease the risk of diabetes.    Morbid obesity (HCC)  BMI 39.0-39.9,adult     Labs reviewed  in chart with patient from 06/10/23    Return in about 4 weeks (around 08/05/2023).Marland Kitchen She was informed of the importance of frequent follow up visits to maximize her success with intensive lifestyle modifications for her multiple health conditions.   ATTESTASTION STATEMENTS:  Reviewed by clinician on day of visit: allergies, medications, problem list, medical history, surgical history, family history, social history, and previous encounter notes.   Theodis Sato. Nasier Thumm FNP-C

## 2023-07-16 ENCOUNTER — Other Ambulatory Visit (HOSPITAL_COMMUNITY): Payer: Self-pay

## 2023-07-17 ENCOUNTER — Other Ambulatory Visit (HOSPITAL_BASED_OUTPATIENT_CLINIC_OR_DEPARTMENT_OTHER): Payer: Self-pay

## 2023-08-04 ENCOUNTER — Ambulatory Visit: Payer: BC Managed Care – PPO | Admitting: Obstetrics and Gynecology

## 2023-08-04 ENCOUNTER — Other Ambulatory Visit (HOSPITAL_COMMUNITY)
Admission: RE | Admit: 2023-08-04 | Discharge: 2023-08-04 | Disposition: A | Discharge status: Home / Self Care | Payer: BC Managed Care – PPO | Source: Ambulatory Visit | Attending: Obstetrics and Gynecology | Admitting: Obstetrics and Gynecology

## 2023-08-04 VITALS — BP 129/80 | HR 84 | Ht 62.0 in | Wt 226.6 lb

## 2023-08-04 DIAGNOSIS — Z124 Encounter for screening for malignant neoplasm of cervix: Secondary | ICD-10-CM | POA: Diagnosis present

## 2023-08-04 DIAGNOSIS — Z01419 Encounter for gynecological examination (general) (routine) without abnormal findings: Secondary | ICD-10-CM | POA: Diagnosis present

## 2023-08-04 DIAGNOSIS — Z206 Contact with and (suspected) exposure to human immunodeficiency virus [HIV]: Secondary | ICD-10-CM | POA: Diagnosis present

## 2023-08-04 DIAGNOSIS — E049 Nontoxic goiter, unspecified: Secondary | ICD-10-CM

## 2023-08-04 DIAGNOSIS — Z9229 Personal history of other drug therapy: Secondary | ICD-10-CM | POA: Diagnosis not present

## 2023-08-04 NOTE — Progress Notes (Unsigned)
Patient presents for Annual.  LMP: 07/27/23 Last pap: Date: 2021-WNL No Hx of Abnormal pap  Contraception:  condoms  Mammogram: Due, last mammogram: Never  STD Screening: Declines Flu Vaccine : N/A  CC:  Annual   Wants to discuss Injection for partner being HIV positive as Truvada was making her sick and was advised to stop it

## 2023-08-04 NOTE — Progress Notes (Unsigned)
HPI: Linda Mckinney is a 38 y.o. female who presents to the [facility] pharmacy clinic for Apretude administration and HIV PrEP follow up.  Insured   []    Uninsured  []    Patient Active Problem List   Diagnosis Date Noted   Class 3 severe obesity due to excess calories with body mass index (BMI) of 40.0 to 44.9 in adult (HCC) 05/20/2023   Abnormal craving 06/26/2022   Exposure to HIV 06/26/2022   Vitamin D deficiency 09/13/2021   Tibial tendonitis, posterior, right 05/09/2021   Anxiety 05/05/2020   Carpal tunnel syndrome on both sides 04/12/2019   GERD (gastroesophageal reflux disease) 03/23/2018   Prediabetes 01/30/2017   Preventative health care 01/24/2016   Essential hypertension 07/18/2015   Morbid obesity (HCC) 07/18/2015    Patient's Medications  New Prescriptions   No medications on file  Previous Medications   DOCUSATE SODIUM (COLACE) 100 MG CAPSULE    Take 1 capsule (100 mg total) by mouth 2 (two) times daily.   EMTRICITABINE-TENOFOVIR (TRUVADA) 200-300 MG TABLET    Take 1 tablet by mouth daily.   HYDROCHLOROTHIAZIDE (HYDRODIURIL) 12.5 MG TABLET    Take 1 tablet (12.5 mg total) by mouth daily for blood pressure.   METFORMIN (GLUCOPHAGE) 500 MG TABLET    Take 1 tablet (500 mg total) by mouth daily with breakfast.   NALTREXONE-BUPROPION HCL ER (CONTRAVE) 8-90 MG TB12    Take 2 tablets by mouth 2 (two) times daily.   OMEPRAZOLE (PRILOSEC) 20 MG CAPSULE    Take 20 mg by mouth daily.   ONDANSETRON (ZOFRAN-ODT) 4 MG DISINTEGRATING TABLET    Take 1 tablet (4 mg total) by mouth every 6 (six) hours as needed for nausea.   POLYETHYLENE GLYCOL POWDER (GLYCOLAX/MIRALAX) 17 GM/SCOOP POWDER    Take 17 g by mouth daily as needed.   VITAMIN D, ERGOCALCIFEROL, (DRISDOL) 1.25 MG (50000 UNIT) CAPS CAPSULE    Take 1 capsule (50,000 Units total) by mouth every 7 (seven) days.  Modified Medications   No medications on file  Discontinued Medications   No medications on file     Allergies: No Known Allergies  Labs: No results found for: "HIV1RNAQUANT", "HIV1RNAVL", "CD4TABS"  RPR and STI Lab Results  Component Value Date   LABRPR Non Reactive 06/11/2022   LABRPR Non Reactive 04/06/2018   LABRPR Non Reactive 04/03/2017   LABRPR NON REAC 04/20/2014    STI Results GC CT  04/06/2018 12:00 AM Negative  Negative   04/03/2017 12:00 AM Negative  Negative     Hepatitis B Lab Results  Component Value Date   HEPBSAG Negative 04/06/2018   Hepatitis C No results found for: "HEPCAB", "HCVRNAPCRQN" Hepatitis A No results found for: "HAV" Lipids: Lab Results  Component Value Date   CHOL 164 06/10/2023   TRIG 47 06/10/2023   HDL 54 06/10/2023   CHOLHDL 3 05/14/2022   VLDL 8.0 05/14/2022   LDLCALC 100 (H) 06/10/2023    Current PrEP Regimen: None  TARGET DATE: N/a  Assessment:   HPI: Linda Mckinney is a 38 y.o. female who presents to the [facility] pharmacy clinic to discuss and initiate PrEP.  Insured   []    Uninsured  []    Patient Active Problem List   Diagnosis Date Noted   Class 3 severe obesity due to excess calories with body mass index (BMI) of 40.0 to 44.9 in adult [facility]) 05/20/2023   Abnormal craving 06/26/2022   Exposure to HIV 06/26/2022  Vitamin D deficiency 09/13/2021   Tibial tendonitis, posterior, right 05/09/2021   Anxiety 05/05/2020   Carpal tunnel syndrome on both sides 04/12/2019   GERD (gastroesophageal reflux disease) 03/23/2018   Prediabetes 01/30/2017   Preventative health care 01/24/2016   Essential hypertension 07/18/2015   Morbid obesity (HCC) 07/18/2015    Patient's Medications  New Prescriptions   No medications on file  Previous Medications   DOCUSATE SODIUM (COLACE) 100 MG CAPSULE    Take 1 capsule (100 mg total) by mouth 2 (two) times daily.   EMTRICITABINE-TENOFOVIR (TRUVADA) 200-300 MG TABLET    Take 1 tablet by mouth daily.   HYDROCHLOROTHIAZIDE (HYDRODIURIL) 12.5 MG TABLET    Take 1 tablet (12.5 mg  total) by mouth daily for blood pressure.   METFORMIN (GLUCOPHAGE) 500 MG TABLET    Take 1 tablet (500 mg total) by mouth daily with breakfast.   NALTREXONE-BUPROPION HCL ER (CONTRAVE) 8-90 MG TB12    Take 2 tablets by mouth 2 (two) times daily.   OMEPRAZOLE (PRILOSEC) 20 MG CAPSULE    Take 20 mg by mouth daily.   ONDANSETRON (ZOFRAN-ODT) 4 MG DISINTEGRATING TABLET    Take 1 tablet (4 mg total) by mouth every 6 (six) hours as needed for nausea.   POLYETHYLENE GLYCOL POWDER (GLYCOLAX/MIRALAX) 17 GM/SCOOP POWDER    Take 17 g by mouth daily as needed.   VITAMIN D, ERGOCALCIFEROL, (DRISDOL) 1.25 MG (50000 UNIT) CAPS CAPSULE    Take 1 capsule (50,000 Units total) by mouth every 7 (seven) days.  Modified Medications   No medications on file  Discontinued Medications   No medications on file        No data to display          Labs:  SCr: Lab Results  Component Value Date   CREATININE 1.15 (H) 06/10/2023   CREATININE 1.07 (H) 04/01/2023   CREATININE 1.20 (H) 03/04/2023   CREATININE 0.93 11/04/2022   CREATININE 1.02 (H) 06/11/2022   HIV Lab Results  Component Value Date   HIV Non Reactive 06/11/2022   HIV Non Reactive 04/06/2018   HIV Non Reactive 04/03/2017   HIV NONREACTIVE 04/20/2014   Hepatitis B Lab Results  Component Value Date   HEPBSAG Negative 04/06/2018   Hepatitis C No results found for: "HEPCAB", "HCVRNAPCRQN" Hepatitis A No results found for: "HAV" RPR and STI Lab Results  Component Value Date   LABRPR Non Reactive 06/11/2022   LABRPR Non Reactive 04/06/2018   LABRPR Non Reactive 04/03/2017   LABRPR NON REAC 04/20/2014    STI Results GC CT  04/06/2018 12:00 AM Negative  Negative   04/03/2017 12:00 AM Negative  Negative     Assessment: Linda Mckinney presents today to discuss initiation of Apretude for PrEP. She was previously taking Truvada, but would like to switch to injectable PrEP due to nausea, reportedly from Truvada. She reports that her partner is  HIV positive, and *** currently well controlled. Most labs obtained at PCP recently, but will obtain HIV Ab. Hepatitis A/B serologies are pending through primary provider.  Counseled that Apretude is one intramuscular injection in the gluteal muscle for each visit. Explained that the second injection is 30 days after the initial injection then every 2 months thereafter. Discussed follow up appointments moving forward. Screened for acute HIV symptoms such as fatigue, muscle aches, rash, sore throat, lymphadenopathy, headache, night sweats, nausea/vomiting/diarrhea, and fever. Denies any symptoms.  Explained that showing up to injection appointments is very important and warned that  if appointments are missed, protection will be minimal and the risk of acquiring HIV becomes much higher. Counseled on possible side effects associated with the injections such as injection site pain, which is usually mild to moderate in nature, injection site nodules, and injection site reactions.   We will get working on approval for Apretude through her insurance, and plan for follow up when it is approved.  >> *** consider monthly injections due to BMI >40?  Plan: - HIV ab - Follow up Apretude approval through insurance - Will follow up when Apretude approved. - Call with any issues or questions  Lora Paula, PharmD PGY-2 Infectious Diseases Pharmacy Resident [facility] 08/04/2023 7:26 PM

## 2023-08-05 ENCOUNTER — Other Ambulatory Visit: Payer: Self-pay

## 2023-08-05 ENCOUNTER — Other Ambulatory Visit (HOSPITAL_COMMUNITY): Payer: Self-pay

## 2023-08-05 ENCOUNTER — Ambulatory Visit: Payer: BC Managed Care – PPO | Admitting: Nurse Practitioner

## 2023-08-05 ENCOUNTER — Ambulatory Visit (INDEPENDENT_AMBULATORY_CARE_PROVIDER_SITE_OTHER): Payer: BC Managed Care – PPO | Admitting: Pharmacist

## 2023-08-05 ENCOUNTER — Encounter: Payer: Self-pay | Admitting: Nurse Practitioner

## 2023-08-05 ENCOUNTER — Telehealth: Payer: Self-pay

## 2023-08-05 VITALS — BP 126/73 | HR 75 | Temp 97.9°F | Ht 62.0 in | Wt 217.0 lb

## 2023-08-05 DIAGNOSIS — Z6839 Body mass index (BMI) 39.0-39.9, adult: Secondary | ICD-10-CM

## 2023-08-05 DIAGNOSIS — E559 Vitamin D deficiency, unspecified: Secondary | ICD-10-CM

## 2023-08-05 DIAGNOSIS — R7303 Prediabetes: Secondary | ICD-10-CM | POA: Diagnosis not present

## 2023-08-05 DIAGNOSIS — Z2981 Encounter for HIV pre-exposure prophylaxis: Secondary | ICD-10-CM | POA: Diagnosis not present

## 2023-08-05 LAB — RPR+HBSAG+HCVAB+...
HIV Screen 4th Generation wRfx: NONREACTIVE
Hep C Virus Ab: NONREACTIVE
Hepatitis B Surface Ag: NEGATIVE
RPR Ser Ql: NONREACTIVE

## 2023-08-05 LAB — TSH RFX ON ABNORMAL TO FREE T4: TSH: 1.18 u[IU]/mL (ref 0.450–4.500)

## 2023-08-05 MED ORDER — METFORMIN HCL 500 MG PO TABS
500.0000 mg | ORAL_TABLET | Freq: Every day | ORAL | 0 refills | Status: DC
  Filled 2023-08-05: qty 30, 30d supply, fill #0

## 2023-08-05 MED ORDER — TIRZEPATIDE-WEIGHT MANAGEMENT 2.5 MG/0.5ML ~~LOC~~ SOAJ
2.5000 mg | SUBCUTANEOUS | 0 refills | Status: DC
  Filled 2023-08-05 – 2023-08-18 (×4): qty 2, 28d supply, fill #0

## 2023-08-05 MED ORDER — VITAMIN D (ERGOCALCIFEROL) 1.25 MG (50000 UNIT) PO CAPS
50000.0000 [IU] | ORAL_CAPSULE | ORAL | 0 refills | Status: DC
  Filled 2023-08-05: qty 5, 35d supply, fill #0

## 2023-08-05 NOTE — Progress Notes (Signed)
HPI: Linda Mckinney is a 38 y.o. female who presents to the [facility] pharmacy clinic for potential Apretude initiation and HIV PrEP follow up.  Insured   [x]    Uninsured  []    Patient Active Problem List   Diagnosis Date Noted   Class 3 severe obesity due to excess calories with body mass index (BMI) of 40.0 to 44.9 in adult (HCC) 05/20/2023   Abnormal craving 06/26/2022   Exposure to HIV 06/26/2022   Vitamin D deficiency 09/13/2021   Tibial tendonitis, posterior, right 05/09/2021   Anxiety 05/05/2020   Carpal tunnel syndrome on both sides 04/12/2019   GERD (gastroesophageal reflux disease) 03/23/2018   Prediabetes 01/30/2017   Preventative health care 01/24/2016   Essential hypertension 07/18/2015   Morbid obesity (HCC) 07/18/2015    Patient's Medications  New Prescriptions   No medications on file  Previous Medications   DOCUSATE SODIUM (COLACE) 100 MG CAPSULE    Take 1 capsule (100 mg total) by mouth 2 (two) times daily.   EMTRICITABINE-TENOFOVIR (TRUVADA) 200-300 MG TABLET    Take 1 tablet by mouth daily.   HYDROCHLOROTHIAZIDE (HYDRODIURIL) 12.5 MG TABLET    Take 1 tablet (12.5 mg total) by mouth daily for blood pressure.   METFORMIN (GLUCOPHAGE) 500 MG TABLET    Take 1 tablet (500 mg total) by mouth daily with breakfast.   NALTREXONE-BUPROPION HCL ER (CONTRAVE) 8-90 MG TB12    Take 2 tablets by mouth 2 (two) times daily.   OMEPRAZOLE (PRILOSEC) 20 MG CAPSULE    Take 20 mg by mouth daily.   ONDANSETRON (ZOFRAN-ODT) 4 MG DISINTEGRATING TABLET    Take 1 tablet (4 mg total) by mouth every 6 (six) hours as needed for nausea.   POLYETHYLENE GLYCOL POWDER (GLYCOLAX/MIRALAX) 17 GM/SCOOP POWDER    Take 17 g by mouth daily as needed.   TIRZEPATIDE (ZEPBOUND) 2.5 MG/0.5ML PEN    Inject 2.5 mg into the skin once a week.   VITAMIN D, ERGOCALCIFEROL, (DRISDOL) 1.25 MG (50000 UNIT) CAPS CAPSULE    Take 1 capsule (50,000 Units total) by mouth every 7 (seven) days.  Modified Medications    No medications on file  Discontinued Medications   No medications on file    Allergies: No Known Allergies  Labs: No results found for: "HIV1RNAQUANT", "HIV1RNAVL", "CD4TABS"  RPR and STI Lab Results  Component Value Date   LABRPR Non Reactive 08/04/2023   LABRPR Non Reactive 06/11/2022   LABRPR Non Reactive 04/06/2018   LABRPR Non Reactive 04/03/2017   LABRPR NON REAC 04/20/2014    STI Results GC CT  04/06/2018 12:00 AM Negative  Negative   04/03/2017 12:00 AM Negative  Negative     Hepatitis B Lab Results  Component Value Date   HEPBSAG Negative 08/04/2023   Hepatitis C No results found for: "HEPCAB", "HCVRNAPCRQN" Hepatitis A No results found for: "HAV" Lipids: Lab Results  Component Value Date   CHOL 164 06/10/2023   TRIG 47 06/10/2023   HDL 54 06/10/2023   CHOLHDL 3 05/14/2022   VLDL 8.0 05/14/2022   LDLCALC 100 (H) 06/10/2023    Assessment: Lysa presents today to discuss PrEP options. They have history with Truvada with last fill 09/03/22 30DS. Patient shared past intolerance experienced with Truvada, specifically GI upset. They share interest to learn more about Apretude after hearing about it on a commercial. Patient also shared that her spouse is HIV positive but currently has undetectable HIV RNA and is currently being followed at  [facility] on [place]. Discussed U=U with patient. They were informed that as long as their partner stays undetectable, their risk of contracting HIV is extremely low. After our discussion, patient decided to not continue PrEP at this time. Patient shared that her partner shares their after visit summaries and lab values with her. Teddie will follow her spouse's HIV status and will contact [facility] pharmacy clinic if they'd like to initiate PrEP in the future. We are happy to start Apretude with Jyl in the future if she decides to change her mind.   Plan: - Follow up with any questions or concerns  Dimple Casey. Edwena Blow, PharmD  Candidate [employer]

## 2023-08-05 NOTE — Progress Notes (Signed)
NEW REFERRAL TO CPP CLINIC  

## 2023-08-05 NOTE — Telephone Encounter (Signed)
[facility] Patient Product/process development scientist completed.    The patient is insured through [employer] .  Apretude  $0.00 [place] we will have to put a override to attest to Prep for the copay to be $0.00 Truvada $0.00  We will continue to follow to see if copay assistance is needed.  Clearance Coots, CPhT Specialty Pharmacy Patient [facility] [place] [facility] Phone: [phone] Fax:  [phone]

## 2023-08-05 NOTE — Progress Notes (Signed)
Office: [phone]  /  Fax: [phone]  WEIGHT SUMMARY AND BIOMETRICS  Weight Lost Since Last Visit: 0  Weight Gained Since Last Visit: 0   Vitals Temp: 97.9 F (36.6 C) BP: 126/73 Pulse Rate: 75 SpO2: 99 %   Anthropometric Measurements Height: 5\' 2"  (1.575 m) Weight: 217 lb (98.4 kg) BMI (Calculated): 39.68 Weight at Last Visit: 217lb Weight Lost Since Last Visit: 0 Weight Gained Since Last Visit: 0 Starting Weight: 245lb Total Weight Loss (lbs): 28 lb (12.7 kg)   Body Composition  Body Fat %: 45.1 % Fat Mass (lbs): 98.2 lbs Muscle Mass (lbs): 113.4 lbs Total Body Water (lbs): 86.8 lbs Visceral Fat Rating : 12   Other Clinical Data Fasting: no Labs: no Today's Visit #: 29 Starting Date: 09/12/21     HPI  Chief Complaint: OBESITY  Linda Mckinney is here to discuss her progress with her obesity treatment plan. She is on the the Category 2 Plan and states she is following her eating plan approximately 50 % of the time. She states she is exercising 0 minutes 0 days per week.   Interval History:  Since last office visit she has maintained her weight.  She is struggling with polyphagia and cravings.    Pharmacotherapy for weight loss: She has not been taking Contrave (has been out for the past 2 weeks) for medical weight loss due to needing a refill.  Denies side effects.    Previous pharmacotherapy for medical weight loss:  Zepbound-stopped due to cost/coverage-did well She took Topamax and stopped due to side effects of fatigue.    Bariatric surgery:  Patient has not had bariatric surgery    Prediabetes Last A1c was 5.7  Medication(s): Metformin 500mg  daily.  Denies side effects Polyphagia:Yes Lab Results  Component Value Date   HGBA1C 5.7 (H) 06/10/2023   HGBA1C 5.7 (H) 03/04/2023   HGBA1C 5.5 11/04/2022   HGBA1C 6.0 05/14/2022   HGBA1C 5.7 (H) 12/17/2021   Lab Results  Component Value Date   INSULIN 16.6 06/10/2023   INSULIN 11.9  03/04/2023   INSULIN 8.1 11/04/2022   INSULIN 12.2 12/17/2021   INSULIN 29.5 (H) 09/12/2021   Vit D deficiency  She is taking Vit D 50,000 IU weekly.  Denies side effects.  Denies nausea, vomiting or muscle weakness.    Lab Results  Component Value Date   VD25OH 40.4 06/10/2023   VD25OH 34.8 03/04/2023   VD25OH 30.0 11/04/2022     PHYSICAL EXAM:  Blood pressure 126/73, pulse 75, temperature 97.9 F (36.6 C), height 5\' 2"  (1.575 m), weight 217 lb (98.4 kg), SpO2 99%. Body mass index is 39.69 kg/m.  General: She is overweight, cooperative, alert, well developed, and in no acute distress. PSYCH: Has normal mood, affect and thought process.   Extremities: No edema.  Neurologic: No gross sensory or motor deficits. No tremors or fasciculations noted.    DIAGNOSTIC DATA REVIEWED:  BMET    Component Value Date/Time   NA 140 06/10/2023 1045   K 4.0 06/10/2023 1045   CL 106 06/10/2023 1045   CO2 21 06/10/2023 1045   GLUCOSE 93 06/10/2023 1045   GLUCOSE 93 05/14/2022 1119   BUN 18 06/10/2023 1045   CREATININE 1.15 (H) 06/10/2023 1045   CALCIUM 9.3 06/10/2023 1045   GFRNONAA >60 01/24/2021 1449   Lab Results  Component Value Date   HGBA1C 5.7 (H) 06/10/2023   HGBA1C 5.7 01/15/2016   Lab Results  Component Value Date   INSULIN  16.6 06/10/2023   INSULIN 29.5 (H) 09/12/2021   Lab Results  Component Value Date   TSH 1.180 08/04/2023   CBC    Component Value Date/Time   WBC 9.2 11/04/2022 1120   WBC 11.2 (H) 11/07/2021 1843   RBC 4.40 11/04/2022 1120   RBC 4.09 11/07/2021 1843   HGB 13.1 11/04/2022 1120   HCT 40.1 11/04/2022 1120   PLT 266 11/04/2022 1120   MCV 91 11/04/2022 1120   MCH 29.8 11/04/2022 1120   MCH 28.6 11/07/2021 1843   MCHC 32.7 11/04/2022 1120   MCHC 32.3 11/07/2021 1843   RDW 12.9 11/04/2022 1120   Iron Studies No results found for: "IRON", "TIBC", "FERRITIN", "IRONPCTSAT" Lipid Panel     Component Value Date/Time   CHOL 164 06/10/2023  1045   TRIG 47 06/10/2023 1045   HDL 54 06/10/2023 1045   CHOLHDL 3 05/14/2022 1119   VLDL 8.0 05/14/2022 1119   LDLCALC 100 (H) 06/10/2023 1045   Hepatic Function Panel     Component Value Date/Time   PROT 7.2 06/10/2023 1045   ALBUMIN 4.2 06/10/2023 1045   AST 16 06/10/2023 1045   ALT 19 06/10/2023 1045   ALKPHOS 71 06/10/2023 1045   BILITOT 0.5 06/10/2023 1045      Component Value Date/Time   TSH 1.180 08/04/2023 1547   TSH 1.010 11/04/2022 1120   Nutritional Lab Results  Component Value Date   VD25OH 40.4 06/10/2023   VD25OH 34.8 03/04/2023   VD25OH 30.0 11/04/2022     ASSESSMENT AND PLAN  TREATMENT PLAN FOR OBESITY:  Recommended Dietary Goals  Linda Mckinney is currently in the action stage of change. As such, her goal is to continue weight management plan. She has agreed to following a lower carbohydrate, vegetable and lean protein rich diet plan.  Behavioral Intervention  We discussed the following Behavioral Modification Strategies today: increasing lean protein intake to established goals, decreasing simple carbohydrates , increasing vegetables, increasing water intake , work on meal planning and preparation, planning for success, and continue to work on maintaining a reduced calorie state, getting the recommended amount of protein, incorporating whole foods, making healthy choices, staying well hydrated and practicing mindfulness when eating..  Additional resources provided today: NA  Recommended Physical Activity Goals  Linda Mckinney has been advised to work up to 150 minutes of moderate intensity aerobic activity a week and strengthening exercises 2-3 times per week for cardiovascular health, weight loss maintenance and preservation of muscle mass.   She has agreed to Think about enjoyable ways to increase daily physical activity and overcoming barriers to exercise, Increase physical activity in their day and reduce sedentary time (increase NEAT)., and Work on  scheduling and tracking physical activity.    Pharmacotherapy We discussed various medication options to help Linda Mckinney with her weight loss efforts and we both agreed to start Zepbound 2.5mg .  Side effects discussed.  Using condoms for protection.  Contraindications:  Pancreatitis (active gallstones) Medullary thyroid cancer High triglycerides (>500)-will need labs prior to starting Multiple Endocrine Neoplasia syndrome type 2 (MEN 2) Trying to get pregnant Breastfeeding Use with caution with taking insulin or sulfonylureas (will need to monitor blood sugars for hypoglycemia)  ASSOCIATED CONDITIONS ADDRESSED TODAY  Action/Plan  Prediabetes -     Continue metFORMIN HCl; Take 1 tablet (500 mg total) by mouth daily with breakfast.  Dispense: 30 tablet; Refill: 0.  Side effects discussed  Vitamin D deficiency -     Vitamin D (Ergocalciferol); Take 1 capsule (  50,000 Units total) by mouth every 7 (seven) days.  Dispense: 5 capsule; Refill: 0  Low Vitamin D level contributes to fatigue and are associated with obesity, breast, and colon cancer. She agrees to continue to take prescription Vitamin D @50 ,000 IU every week and will follow-up for routine testing of Vitamin D, at least 2-3 times per year to avoid over-replacement.   Morbid obesity (HCC) -     Tirzepatide-Weight Management; Inject 2.5 mg into the skin once a week.  Dispense: 2 mL; Refill: 0  BMI 39.0-39.9,adult         Return in about 4 weeks (around 09/02/2023).Marland Kitchen She was informed of the importance of frequent follow up visits to maximize her success with intensive lifestyle modifications for her multiple health conditions.   ATTESTASTION STATEMENTS:  Reviewed by clinician on day of visit: allergies, medications, problem list, medical history, surgical history, family history, social history, and previous encounter notes.     Theodis Sato. Rewa Weissberg FNP-C

## 2023-08-05 NOTE — Patient Instructions (Signed)

## 2023-08-06 NOTE — Progress Notes (Signed)
Obstetrics and Gynecology New Patient Evaluation  Appointment Date: 08/04/2023  OBGYN Clinic: [facility]  Primary Care Provider: Doreene Nest  Chief Complaint:  Chief Complaint  Patient presents with   Annual Exam    History of Present Illness: Linda Mckinney is a 38 y.o. G0P0000 (LMP: just finished), seen for the above chief complaint. Her past medical history is significant for HTN, partner is HIV+, BMI 39, pre-diabetes.    Patient prescribed truvada in the past for PreP but stopped due to GI side effets  Review of Systems: Pertinent items are noted in HPI.   Patient Active Problem List   Diagnosis Date Noted   Class 3 severe obesity due to excess calories with body mass index (BMI) of 40.0 to 44.9 in adult (HCC) 05/20/2023   Abnormal craving 06/26/2022   Exposure to HIV 06/26/2022   Vitamin D deficiency 09/13/2021   Tibial tendonitis, posterior, right 05/09/2021   Anxiety 05/05/2020   Carpal tunnel syndrome on both sides 04/12/2019   GERD (gastroesophageal reflux disease) 03/23/2018   Prediabetes 01/30/2017   Preventative health care 01/24/2016   Essential hypertension 07/18/2015   Morbid obesity (HCC) 07/18/2015    Past Medical History:  Past Medical History:  Diagnosis Date   Anxiety    GERD (gastroesophageal reflux disease)    Hypertension    Pre-diabetes     Past Surgical History:  Past Surgical History:  Procedure Laterality Date   BUNIONECTOMY  2015   CARPAL TUNNEL RELEASE Right 01/30/2021   Procedure: RIGHT CARPAL TUNNEL RELEASE;  Surgeon: Cindee Salt, MD;  Location: [facility];  Service: Orthopedics;  Laterality: Right;  45 MIN   CHOLECYSTECTOMY      Past Obstetrical History:  OB History  Gravida Para Term Preterm AB Living  0 0 0 0 0 0  SAB IAB Ectopic Multiple Live Births  0 0 0 0 0   Past Gynecological History: As per HPI. Periods: qmonth, regular, approximately one week History of Pap  Smear(s): Yes.   Last pap 2021, which was negative She is currently using condoms for contraception.   Social History:  Social History   Socioeconomic History   Marital status: Married    Spouse name: Not on file   Number of children: 0   Years of education: Not on file   Highest education level: Associate degree: academic program  Occupational History    Comment: Nurse Tech  Tobacco Use   Smoking status: Never   Smokeless tobacco: Never  Vaping Use   Vaping status: Never Used  Substance and Sexual Activity   Alcohol use: No    Alcohol/week: 0.0 standard drinks of alcohol   Drug use: No   Sexual activity: Yes    Partners: Male    Birth control/protection: Condom  Other Topics Concern   Not on file  Social History Narrative   Single.   No children.    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    Social Drivers of Corporate investment banker Strain: Low Risk  (05/20/2023)   Overall Financial Resource Strain (CARDIA)    Difficulty of Paying Living Expenses: Not hard at all  Food Insecurity: No Food Insecurity (05/20/2023)   Hunger Vital Sign    Worried About Running Out of Food in the Last Year: Never true    Ran Out of Food in the Last Year: Never true  Transportation Needs: No Transportation Needs (05/20/2023)  PRAPARE - Administrator, Civil Service (Medical): No    Lack of Transportation (Non-Medical): No  Physical Activity: Unknown (05/20/2023)   Exercise Vital Sign    Days of Exercise per Week: 0 days    Minutes of Exercise per Session: Not on file  Stress: No Stress Concern Present (05/20/2023)   [employer] of Occupational Health - Occupational Stress Questionnaire    Feeling of Stress : Only a little  Social Connections: Moderately Integrated (05/20/2023)   Social Connection and Isolation Panel [NHANES]    Frequency of Communication with Friends and Family: More than three times a week    Frequency of Social Gatherings with  Friends and Family: Once a week    Attends Religious Services: More than 4 times per year    Active Member of [employer] or Organizations: No    Attends Engineer, structural: Not on file    Marital Status: Married  Catering manager Violence: Not on file    Family History:  Family History  Problem Relation Age of Onset   Hypertension Mother    Depression Mother    Sleep apnea Mother    Obesity Mother    Diabetes Father    Stroke Father    Heart disease Father    Obesity Father    Hypertension Sister    Cancer Maternal Grandmother 40       breast    Medications [name] had no medications administered during this visit. Current Outpatient Medications  Medication Sig Dispense Refill   docusate sodium (COLACE) 100 MG capsule Take 1 capsule (100 mg total) by mouth 2 (two) times daily. 10 capsule 0   hydrochlorothiazide (HYDRODIURIL) 12.5 MG tablet Take 1 tablet (12.5 mg total) by mouth daily for blood pressure. 90 tablet 3   Naltrexone-buPROPion HCl ER (CONTRAVE) 8-90 MG TB12 Take 2 tablets by mouth 2 (two) times daily. 120 tablet 0   omeprazole (PRILOSEC) 20 MG capsule Take 20 mg by mouth daily.     ondansetron (ZOFRAN-ODT) 4 MG disintegrating tablet Take 1 tablet (4 mg total) by mouth every 6 (six) hours as needed for nausea. 20 tablet 0   polyethylene glycol powder (GLYCOLAX/MIRALAX) 17 GM/SCOOP powder Take 17 g by mouth daily as needed. 3350 g 1   metFORMIN (GLUCOPHAGE) 500 MG tablet Take 1 tablet (500 mg total) by mouth daily with breakfast. 30 tablet 0   tirzepatide (ZEPBOUND) 2.5 MG/0.5ML Pen Inject 2.5 mg into the skin once a week. 2 mL 0   Vitamin D, Ergocalciferol, (DRISDOL) 1.25 MG (50000 UNIT) CAPS capsule Take 1 capsule (50,000 Units total) by mouth every 7 (seven) days. 5 capsule 0   No current facility-administered medications for this visit.    Allergies Patient has no known allergies.   Physical Exam:  BP 129/80   Pulse 84   Ht 5\' 2"  (1.575 m)    Wt 226 lb 9.6 oz (102.8 kg)   BMI 41.45 kg/m  Body mass index is 41.45 kg/m. General appearance: Well nourished, well developed female in no acute distress.  Neck:  ?enlarged thyroid Cardiovascular: normal s1 and s2.  No murmurs, rubs or gallops. Respiratory:  Clear to auscultation bilateral. Normal respiratory effort Abdomen: positive bowel sounds and no masses, hernias; diffusely non tender to palpation, non distended Breasts: breasts appear normal, no suspicious masses, no skin or nipple changes or axillary nodes. Neuro/Psych:  Normal mood and affect.  Skin:  Warm and dry.  Lymphatic:  No  inguinal lymphadenopathy.   Cervical and breast exam performed in the presence of a chaperone Pelvic exam: is not limited by body habitus EGBUS: within normal limits Vagina: within normal limits and with no blood or discharge in the vault Cervix: normal appearing cervix without tenderness, discharge or lesions. Uterus:  nonenlarged and non tender Adnexa:  normal adnexa and no mass, fullness, tenderness Rectovaginal: deferred  Laboratory: none  Radiology: none  Assessment: patient doing well  Plan:  1. Exposure to HIV (Primary) Patient states partner on medicines and VL undetectable. Recommend ID referral to talk about potential options - Cytology - PAP - Ambulatory referral to Infectious Disease - RPR+HBsAg+HCVAb+...  2. Well woman exam with routine gynecological exam - Cytology - PAP - RPR+HBsAg+HCVAb+... - MM 3D SCREENING MAMMOGRAM BILATERAL BREAST; Future  3. History of administration of pre-exposure prophylaxis for HIV - Ambulatory referral to Infectious Disease  4. Cervical cancer screening - Cytology - PAP  5. Enlarged thyroid On exam.  - TSH Rfx on Abnormal to Free T4 - US THYROID; Future  Return in about 1 year (around 08/03/2024).  Future Appointments  Date Time Provider Department Center  08/18/2023  2:00 PM [facility] 2 [facility]  09/02/2023 10:45 AM Irene Limbo, FNP PCW-[facility] None  09/15/2023  2:10 PM GI-[facility] MM 2 GI-[facility] GI-BREAST CE    Cornelia Copa MD Attending [facility])

## 2023-08-07 LAB — CYTOLOGY - PAP
Chlamydia: NEGATIVE
Comment: NEGATIVE
Comment: NEGATIVE
Comment: NEGATIVE
Comment: NORMAL
Diagnosis: NEGATIVE
High risk HPV: NEGATIVE
Neisseria Gonorrhea: NEGATIVE
Trichomonas: NEGATIVE

## 2023-08-08 ENCOUNTER — Encounter: Payer: Self-pay | Admitting: Obstetrics and Gynecology

## 2023-08-14 ENCOUNTER — Telehealth: Payer: BC Managed Care – PPO | Admitting: Family Medicine

## 2023-08-14 DIAGNOSIS — R6889 Other general symptoms and signs: Secondary | ICD-10-CM

## 2023-08-14 NOTE — Progress Notes (Signed)
E visit for Flu like symptoms   We are sorry that you are not feeling well.  Here is how we plan to help! Based on what you have shared with me it looks like you may have flu-like symptoms that should be watched but do not seem to indicate anti-viral treatment.   Influenza or "the flu" is   an infection caused by a respiratory virus. The flu virus is highly contagious and persons who did not receive their yearly flu vaccination may "catch" the flu from close contact.  Based upon your symptoms and potential risk factors I recommend that you follow the flu symptoms recommendation that I have listed below.  ANYONE WHO HAS FLU SYMPTOMS SHOULD: Stay home. The flu is highly contagious and going out or to work exposes others! Be sure to drink plenty of fluids. Water is fine as well as fruit juices, sodas and electrolyte beverages. You may want to stay away from caffeine or alcohol. If you are nauseated, try taking small sips of liquids. How do you know if you are getting enough fluid? Your urine should be a pale yellow or almost colorless. Get rest. Taking a steamy shower or using a humidifier may help nasal congestion and ease sore throat pain. Using a saline nasal spray works much the same way. Cough drops, hard candies and sore throat lozenges may ease your cough. Line up a caregiver. Have someone check on you regularly.   GET HELP RIGHT AWAY IF: You cannot keep down liquids or your medications. You become short of breath Your fell like you are going to pass out or loose consciousness. Your symptoms persist after you have completed your treatment plan MAKE SURE YOU  Understand these instructions. Will watch your condition. Will get help right away if you are not doing well or get worse.  Your e-visit answers were reviewed by a board certified advanced clinical practitioner to complete your personal care plan.  Depending on the condition, your plan could have included both over the counter or  prescription medications.  If there is a problem please reply  once you have received a response from your provider.  Your safety is important to [place].  If you have drug allergies check your prescription carefully.    You can use MyChart to ask questions about today's visit, request a non-urgent call back, or ask for a work or school excuse for 24 hours related to this e-Visit. If it has been greater than 24 hours you will need to follow up with your provider, or enter a new e-Visit to address those concerns.  You will get an e-mail in the next two days asking about your experience.  I hope that your e-visit has been valuable and will speed your recovery. Thank you for using e-visits.  I provided 5 minutes of non face-to-face time during this encounter for chart review, medication and order placement, as well as and documentation.

## 2023-08-18 ENCOUNTER — Other Ambulatory Visit (HOSPITAL_COMMUNITY): Payer: Self-pay

## 2023-08-18 ENCOUNTER — Other Ambulatory Visit: Payer: Self-pay

## 2023-08-18 ENCOUNTER — Ambulatory Visit (HOSPITAL_COMMUNITY)
Admission: RE | Admit: 2023-08-18 | Discharge: 2023-08-18 | Disposition: A | Discharge status: Home / Self Care | Payer: BC Managed Care – PPO | Source: Ambulatory Visit | Attending: Obstetrics and Gynecology | Admitting: Obstetrics and Gynecology

## 2023-08-18 ENCOUNTER — Other Ambulatory Visit: Payer: Self-pay | Admitting: Nurse Practitioner

## 2023-08-18 DIAGNOSIS — R7303 Prediabetes: Secondary | ICD-10-CM

## 2023-08-18 DIAGNOSIS — E049 Nontoxic goiter, unspecified: Secondary | ICD-10-CM | POA: Insufficient documentation

## 2023-08-18 MED ORDER — TIRZEPATIDE 2.5 MG/0.5ML ~~LOC~~ SOAJ
2.5000 mg | SUBCUTANEOUS | 0 refills | Status: DC
  Filled 2023-08-18: qty 2, 28d supply, fill #0

## 2023-08-19 ENCOUNTER — Telehealth: Payer: Self-pay

## 2023-08-19 ENCOUNTER — Other Ambulatory Visit: Payer: Self-pay

## 2023-08-19 ENCOUNTER — Other Ambulatory Visit (HOSPITAL_COMMUNITY): Payer: Self-pay

## 2023-08-19 NOTE — Telephone Encounter (Signed)
PA submitted through Cover My Meds for [facility]. Awaiting insurance determination. Key: [id number]

## 2023-08-19 NOTE — Telephone Encounter (Signed)
 Per Cover My Meds:   Your plan only covers this drug when it is used for certain health conditions. Covered use is for type 2 diabetes mellitus. Your plan does not cover the drug for your health condition that your doctor told us you have. We reviewed the information we had. Your request has been denied. Your doctor can send [place] any new or missing information for [place] to review. For this drug, you may have to meet other criteria. You can request the drug policy for more details. You can also request other plan documents for your review.

## 2023-08-19 NOTE — Addendum Note (Signed)
Addended by: Pajaro Bing on: 08/19/2023 04:20 PM   Modules accepted: Orders

## 2023-09-02 ENCOUNTER — Ambulatory Visit: Payer: BC Managed Care – PPO | Admitting: Nurse Practitioner

## 2023-09-02 ENCOUNTER — Other Ambulatory Visit (HOSPITAL_COMMUNITY): Payer: Self-pay

## 2023-09-02 ENCOUNTER — Other Ambulatory Visit: Payer: Self-pay

## 2023-09-02 ENCOUNTER — Encounter: Payer: Self-pay | Admitting: Nurse Practitioner

## 2023-09-02 VITALS — BP 115/55 | HR 80 | Temp 98.1°F | Ht 62.0 in | Wt 213.0 lb

## 2023-09-02 DIAGNOSIS — Z6838 Body mass index (BMI) 38.0-38.9, adult: Secondary | ICD-10-CM

## 2023-09-02 DIAGNOSIS — E559 Vitamin D deficiency, unspecified: Secondary | ICD-10-CM

## 2023-09-02 DIAGNOSIS — E66812 Obesity, class 2: Secondary | ICD-10-CM | POA: Diagnosis not present

## 2023-09-02 DIAGNOSIS — R7303 Prediabetes: Secondary | ICD-10-CM | POA: Diagnosis not present

## 2023-09-02 MED ORDER — VITAMIN D (ERGOCALCIFEROL) 1.25 MG (50000 UNIT) PO CAPS
50000.0000 [IU] | ORAL_CAPSULE | ORAL | 0 refills | Status: DC
  Filled 2023-09-02: qty 4, 28d supply, fill #0

## 2023-09-02 MED ORDER — QSYMIA 7.5-46 MG PO CP24
1.0000 | ORAL_CAPSULE | Freq: Every day | ORAL | 0 refills | Status: DC
  Filled 2023-09-02: qty 30, 30d supply, fill #0

## 2023-09-02 MED ORDER — METFORMIN HCL 500 MG PO TABS
500.0000 mg | ORAL_TABLET | Freq: Every day | ORAL | 0 refills | Status: DC
  Filled 2023-09-02: qty 30, 30d supply, fill #0

## 2023-09-02 NOTE — Progress Notes (Signed)
Office: [phone]  /  Fax: [phone]  WEIGHT SUMMARY AND BIOMETRICS  Weight Lost Since Last Visit: 4lb  Weight Gained Since Last Visit: 0lb   Vitals Temp: 98.1 F (36.7 C) BP: (!) 115/55 Pulse Rate: 80 SpO2: 100 %   Anthropometric Measurements Height: 5\' 2"  (1.575 m) Weight: 213 lb (96.6 kg) BMI (Calculated): 38.95 Weight at Last Visit: 217lb Weight Lost Since Last Visit: 4lb Weight Gained Since Last Visit: 0lb Starting Weight: 245lb Total Weight Loss (lbs): 32 lb (14.5 kg)   Body Composition  Body Fat %: 44.6 % Fat Mass (lbs): 95 lbs Muscle Mass (lbs): 112.2 lbs Total Body Water (lbs): 84.2 lbs Visceral Fat Rating : 11   Other Clinical Data Fasting: Yes Labs: No Today's Visit #: 30 Starting Date: 09/12/21     HPI  Chief Complaint: OBESITY  Alan is here to discuss her progress with her obesity treatment plan. She is on the following a lower carbohydrate, vegetable and lean protein rich diet plan and states she is following her eating plan approximately 75 % of the time. She states she is exercising 0 minutes 0 days per week.   Interval History:  Since last office visit she has lost 4 pounds.     Pharmacotherapy for weight loss: She is not currently taking medications  for medical weight loss.     Previous pharmacotherapy for medical weight loss:   -Zepbound-stopped due to cost/coverage-did well -She took Topamax and stopped due to side effects of fatigue.    Bariatric surgery:  Patient has not had bariatric surgery    Vit D deficiency  She is taking Vit D 50,000 IU weekly.  Denies side effects.  Denies nausea, vomiting or muscle weakness.    Lab Results  Component Value Date   VD25OH 40.4 06/10/2023   VD25OH 34.8 03/04/2023   VD25OH 30.0 11/04/2022     Prediabetes Last A1c was 5.7  Medication(s): Metformin 500mg  daily.  Denies side effects/  Polyphagia:Yes Lab Results  Component Value Date   HGBA1C 5.7 (H) 06/10/2023    HGBA1C 5.7 (H) 03/04/2023   HGBA1C 5.5 11/04/2022   HGBA1C 6.0 05/14/2022   HGBA1C 5.7 (H) 12/17/2021   Lab Results  Component Value Date   INSULIN 16.6 06/10/2023   INSULIN 11.9 03/04/2023   INSULIN 8.1 11/04/2022   INSULIN 12.2 12/17/2021   INSULIN 29.5 (H) 09/12/2021      PHYSICAL EXAM:  Blood pressure (!) 115/55, pulse 80, temperature 98.1 F (36.7 C), height 5\' 2"  (1.575 m), weight 213 lb (96.6 kg), last menstrual period 08/30/2023, SpO2 100%. Body mass index is 38.96 kg/m.  General: She is overweight, cooperative, alert, well developed, and in no acute distress. PSYCH: Has normal mood, affect and thought process.   Extremities: No edema.  Neurologic: No gross sensory or motor deficits. No tremors or fasciculations noted.    DIAGNOSTIC DATA REVIEWED:  BMET    Component Value Date/Time   NA 140 06/10/2023 1045   K 4.0 06/10/2023 1045   CL 106 06/10/2023 1045   CO2 21 06/10/2023 1045   GLUCOSE 93 06/10/2023 1045   GLUCOSE 93 05/14/2022 1119   BUN 18 06/10/2023 1045   CREATININE 1.15 (H) 06/10/2023 1045   CALCIUM 9.3 06/10/2023 1045   GFRNONAA >60 01/24/2021 1449   Lab Results  Component Value Date   HGBA1C 5.7 (H) 06/10/2023   HGBA1C 5.7 01/15/2016   Lab Results  Component Value Date   INSULIN 16.6 06/10/2023  INSULIN 29.5 (H) 09/12/2021   Lab Results  Component Value Date   TSH 1.180 08/04/2023   CBC    Component Value Date/Time   WBC 9.2 11/04/2022 1120   WBC 11.2 (H) 11/07/2021 1843   RBC 4.40 11/04/2022 1120   RBC 4.09 11/07/2021 1843   HGB 13.1 11/04/2022 1120   HCT 40.1 11/04/2022 1120   PLT 266 11/04/2022 1120   MCV 91 11/04/2022 1120   MCH 29.8 11/04/2022 1120   MCH 28.6 11/07/2021 1843   MCHC 32.7 11/04/2022 1120   MCHC 32.3 11/07/2021 1843   RDW 12.9 11/04/2022 1120   Iron Studies No results found for: "IRON", "TIBC", "FERRITIN", "IRONPCTSAT" Lipid Panel     Component Value Date/Time   CHOL 164 06/10/2023 1045   TRIG 47  06/10/2023 1045   HDL 54 06/10/2023 1045   CHOLHDL 3 05/14/2022 1119   VLDL 8.0 05/14/2022 1119   LDLCALC 100 (H) 06/10/2023 1045   Hepatic Function Panel     Component Value Date/Time   PROT 7.2 06/10/2023 1045   ALBUMIN 4.2 06/10/2023 1045   AST 16 06/10/2023 1045   ALT 19 06/10/2023 1045   ALKPHOS 71 06/10/2023 1045   BILITOT 0.5 06/10/2023 1045      Component Value Date/Time   TSH 1.180 08/04/2023 1547   TSH 1.010 11/04/2022 1120   Nutritional Lab Results  Component Value Date   VD25OH 40.4 06/10/2023   VD25OH 34.8 03/04/2023   VD25OH 30.0 11/04/2022     ASSESSMENT AND PLAN  TREATMENT PLAN FOR OBESITY:  Recommended Dietary Goals  Keyonda is currently in the action stage of change. As such, her goal is to continue weight management plan. She has agreed to following a lower carbohydrate, vegetable and lean protein rich diet plan.  Behavioral Intervention  We discussed the following Behavioral Modification Strategies today: increasing lean protein intake to established goals, decreasing simple carbohydrates , increasing vegetables, increasing water intake , work on meal planning and preparation, reading food labels , keeping healthy foods at home, continue to work on implementation of reduced calorie nutritional plan, continue to practice mindfulness when eating, and continue to work on maintaining a reduced calorie state, getting the recommended amount of protein, incorporating whole foods, making healthy choices, staying well hydrated and practicing mindfulness when eating..  Additional resources provided today: NA  Recommended Physical Activity Goals  Sabeen has been advised to work up to 150 minutes of moderate intensity aerobic activity a week and strengthening exercises 2-3 times per week for cardiovascular health, weight loss maintenance and preservation of muscle mass.   She has agreed to Think about enjoyable ways to increase daily physical activity and  overcoming barriers to exercise, Increase physical activity in their day and reduce sedentary time (increase NEAT)., and Work on scheduling and tracking physical activity.    Pharmacotherapy We discussed various medication options to help Francis with her weight loss efforts and we both agreed to start Qsymia dose 2.  Side effects discussed.  Patient is currently using condoms for birth control protection.  We discussed extensively not to get pregnant while she is taking Qsymia due to teratogenic side effects with taking Topamax.  Patient verbalizes understanding. Discussed with Dr. Manson Passey.    ASSOCIATED CONDITIONS ADDRESSED TODAY  Action/Plan  Vitamin D deficiency -     continue Vitamin D (Ergocalciferol); Take 1 capsule (50,000 Units total) by mouth every 7 (seven) days.  Dispense: 5 capsule; Refill: 0  Prediabetes -  continue metFORMIN HCl; Take 1 tablet (500 mg total) by mouth daily with breakfast.  Dispense: 30 tablet; Refill: 0  Class 2 severe obesity due to excess calories with serious comorbidity and body mass index (BMI) of 38.0 to 38.9 in adult [facility]) -     Start Qsymia; Take one po daily  Dispense: 30 capsule; Refill: 0. Side effects discussed           Return in about 4 weeks (around 09/30/2023).Marland Kitchen She was informed of the importance of frequent follow up visits to maximize her success with intensive lifestyle modifications for her multiple health conditions.   ATTESTASTION STATEMENTS:  Reviewed by clinician on day of visit: allergies, medications, problem list, medical history, surgical history, family history, social history, and previous encounter notes.     Theodis Sato. Elanor Cale FNP-C

## 2023-09-02 NOTE — Patient Instructions (Signed)
 What is Qsymia and how does it work?  Qsymia is a prescription only medicine to help with your weight loss. It is a combination of two medicines that are low dose, long-acting: Phentermine & Topiramate. Qsymia contains low dose Phentermine which is a stimulant medicine that could affect your heart rate and blood pressure Qsymia is designed to help you feel satisfied faster that will help you to decrease portion size. Also, it helps curb late night snacking habits. Some food you usually enjoy may start to taste differently which will help you make healthier food choices.  This medicine will be most effective when combined with a reduced calorie diet and physical activity.  How should I take Qsymia? Take daily in the morning with breakfast. Swallow the extended-release capsule whole. Do not crush, break, or chew it.  If you miss a dose, take it as soon as possible. If it is after 12pm, skip the missed dose and go back to your regular dosing schedule. Do not take extra medicine to make up for the missed dose. You have received two separate prescriptions today. You will initially take a lower dose of 3.75mg /23mg  for 14 days then increase to a higher dose of 7.5mg /46mg  for maintenance for 30 days. There are 4 total dosing options. Your provider will discuss any need to go up to a higher dose during your office visits.  If you are taking Levothyroxine, take the Levothyroxine 1 hours before breakfast and the take the Qsymia 1 hour after breakfast. Do not stop taking Qsymia without talking to your provider. Stopping Qsymia suddenly can cause serious side effects, such as seizures and headaches.   What should I avoid while taking Qsymia? Limit caffeine to 1 small cup daily. Examples are soda, coffee, tea, herbal tea, energy drinks, and chocolate Avoid decongestant medicines like Sudafed, Mucinex-D, and Zyrtec-D. Qsymia may cause you to feel dizzy, drowsy, or confused, or to have trouble thinking  or speaking. Do not drive or do anything else that could be dangerous until you know how this medicine affects you.  Women who can become pregnant: Use effective birth control (contraception) consistently while taking Qsymia. If you miss a menstrual period, STOP Qsymia and call our office immediately. Pregnancy tests will be performed at your appointment if indicated.  What side effects may I notice when taking Qsymia? Side effects that usually do not require medical attention (report to our office if they continue or are bothersome): Dry mouth (drink at least 64 oz of fluid daily) Constipation (you may take over the counter laxative if needed) Metallic taste in your mouth when drinking carbonated beverages Numbness or tingling in the hands, arms, feet or face that lasts more than a week Headache Sudden changes in vision Mental fuzziness (problems with concentration, attention, memory or speech) Trouble sleeping (insomnia) Side effects that you should report to our office as soon as possible: Increases in heart rate and/or palpitations (feeling like your heart is racing or pounding in your chest that lasts several minutes) Chest pain Increased blood pressure Dizziness or feeling faint Shortness of breath Irritability Feeling anxious, agitated, restless, or nervous Depression or severe changes in mood Problems urinating Unusual swelling of the legs Vomiting   Other important information You will be asked to sign an informed consent prior to starting Qysmia Qsymia is a federally controlled substance. Keep Qsymia in a safe place to prevent misuse and abuse. Selling or giving away Qsymia may harm others, and is against the law.  Your prescription  will be sent to the pharmacy during your visit.  Refills will require an office visit. Your insurance may not cover the cost of this medicine. Our office will complete a pre-authorization if required by your insurance.

## 2023-09-03 ENCOUNTER — Other Ambulatory Visit: Payer: Self-pay

## 2023-09-03 ENCOUNTER — Other Ambulatory Visit (HOSPITAL_COMMUNITY): Payer: Self-pay

## 2023-09-09 ENCOUNTER — Other Ambulatory Visit: Payer: Self-pay

## 2023-09-09 ENCOUNTER — Other Ambulatory Visit (HOSPITAL_COMMUNITY): Payer: Self-pay

## 2023-09-15 ENCOUNTER — Ambulatory Visit
Admission: RE | Admit: 2023-09-15 | Discharge: 2023-09-15 | Disposition: A | Discharge status: Home / Self Care | Payer: BC Managed Care – PPO | Source: Ambulatory Visit | Attending: Obstetrics and Gynecology

## 2023-09-15 DIAGNOSIS — Z01419 Encounter for gynecological examination (general) (routine) without abnormal findings: Secondary | ICD-10-CM

## 2023-09-19 ENCOUNTER — Other Ambulatory Visit: Payer: Self-pay | Admitting: Obstetrics and Gynecology

## 2023-09-19 DIAGNOSIS — R928 Other abnormal and inconclusive findings on diagnostic imaging of breast: Secondary | ICD-10-CM

## 2023-09-30 ENCOUNTER — Other Ambulatory Visit (HOSPITAL_COMMUNITY): Payer: Self-pay

## 2023-09-30 ENCOUNTER — Encounter: Payer: Self-pay | Admitting: Nurse Practitioner

## 2023-09-30 ENCOUNTER — Other Ambulatory Visit: Payer: Self-pay

## 2023-09-30 ENCOUNTER — Ambulatory Visit: Payer: BC Managed Care – PPO | Admitting: Nurse Practitioner

## 2023-09-30 VITALS — BP 128/84 | HR 72 | Temp 97.9°F | Ht 62.0 in | Wt 215.0 lb

## 2023-09-30 DIAGNOSIS — E559 Vitamin D deficiency, unspecified: Secondary | ICD-10-CM | POA: Diagnosis not present

## 2023-09-30 DIAGNOSIS — Z6839 Body mass index (BMI) 39.0-39.9, adult: Secondary | ICD-10-CM | POA: Diagnosis not present

## 2023-09-30 DIAGNOSIS — R7303 Prediabetes: Secondary | ICD-10-CM | POA: Diagnosis not present

## 2023-09-30 DIAGNOSIS — E6609 Other obesity due to excess calories: Secondary | ICD-10-CM

## 2023-09-30 DIAGNOSIS — E66812 Obesity, class 2: Secondary | ICD-10-CM

## 2023-09-30 MED ORDER — METFORMIN HCL 500 MG PO TABS
500.0000 mg | ORAL_TABLET | Freq: Every day | ORAL | 0 refills | Status: DC
  Filled 2023-09-30 – 2023-10-07 (×2): qty 30, 30d supply, fill #0

## 2023-09-30 MED ORDER — VITAMIN D (ERGOCALCIFEROL) 1.25 MG (50000 UNIT) PO CAPS
50000.0000 [IU] | ORAL_CAPSULE | ORAL | 0 refills | Status: DC
  Filled 2023-09-30 – 2023-10-07 (×2): qty 4, 28d supply, fill #0

## 2023-09-30 MED ORDER — QSYMIA 7.5-46 MG PO CP24
1.0000 | ORAL_CAPSULE | Freq: Every day | ORAL | 0 refills | Status: DC
Start: 2023-09-30 — End: 2023-10-28
  Filled 2023-09-30 – 2023-10-07 (×2): qty 30, 30d supply, fill #0

## 2023-09-30 NOTE — Progress Notes (Signed)
 Office: [phone]  /  Fax: [phone]  WEIGHT SUMMARY AND BIOMETRICS  Weight Lost Since Last Visit: 0lb  Weight Gained Since Last Visit: 2lb   Vitals Temp: 97.9 F (36.6 C) BP: 128/84 Pulse Rate: 72 SpO2: 100 %   Anthropometric Measurements Height: 5\' 2"  (1.575 m) Weight: 215 lb (97.5 kg) BMI (Calculated): 39.31 Weight at Last Visit: 213lb Weight Lost Since Last Visit: 0lb Weight Gained Since Last Visit: 2lb Starting Weight: 245lb Total Weight Loss (lbs): 30 lb (13.6 kg)   Body Composition  Body Fat %: 44.9 % Fat Mass (lbs): 96.8 lbs Muscle Mass (lbs): 112.8 lbs Total Body Water (lbs): 84.6 lbs Visceral Fat Rating : 12   Other Clinical Data Fasting: Yes Labs: No Today's Visit #: 31 Starting Date: 09/12/21     HPI  Chief Complaint: OBESITY  Tiffine is here to discuss her progress with her obesity treatment plan. She is on the following a lower carbohydrate, vegetable and lean protein rich diet plan and states she is following her eating plan approximately 65 % of the time. She states she is exercising 0 minutes 0 days per week.   Interval History:  Since last office visit she has gained 2 pounds. Since starting Qsymia she does notice a chance with taste of certain foods. Her work schedule has changed since her last visit. 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 She is drinking water daily and occ sugar free lemonade.     Pharmacotherapy for weight loss: She is currently taking Qsymia dose 2 for medical weight loss (x 3 weeks).  Denies side effects.  Denies chest pains, shortness of breath or palpitations.   Previous pharmacotherapy for medical weight loss:   -Zepbound-stopped due to cost/coverage-did well -She took Topamax and stopped due to side effects of fatigue.    Bariatric surgery:  Patient has not had bariatric surgery   Vit  D deficiency  She is taking Vit D 50,000 IU weekly.  Denies side effects.  Denies nausea, vomiting or muscle weakness.    Lab Results  Component Value Date   VD25OH 40.4 06/10/2023   VD25OH 34.8 03/04/2023   VD25OH 30.0 11/04/2022     Prediabetes Last A1c was 5.7  Medication(s): Metformin 500mg  daily. Denies side effects.   Polyphagia:Yes Lab Results  Component Value Date   HGBA1C 5.7 (H) 06/10/2023   HGBA1C 5.7 (H) 03/04/2023   HGBA1C 5.5 11/04/2022   HGBA1C 6.0 05/14/2022   HGBA1C 5.7 (H) 12/17/2021   Lab Results  Component Value Date   INSULIN 16.6 06/10/2023   INSULIN 11.9 03/04/2023   INSULIN 8.1 11/04/2022   INSULIN 12.2 12/17/2021   INSULIN 29.5 (H) 09/12/2021    PHYSICAL EXAM:  Blood pressure 128/84, pulse 72, temperature 97.9 F (36.6 C), height 5\' 2"  (1.575 m), weight 215 lb (97.5 kg), last menstrual period 09/28/2023, SpO2 100%. Body mass index is 39.32 kg/m.  General: She is overweight, cooperative, alert, well developed, and in no acute distress. PSYCH: Has normal mood, affect and thought process.   Extremities: No edema.  Neurologic: No gross sensory or motor deficits. No tremors or fasciculations noted.    DIAGNOSTIC DATA REVIEWED:  BMET    Component Value Date/Time   NA 140 06/10/2023 1045   K 4.0 06/10/2023 1045   CL 106 06/10/2023 1045   CO2  21 06/10/2023 1045   GLUCOSE 93 06/10/2023 1045   GLUCOSE 93 05/14/2022 1119   BUN 18 06/10/2023 1045   CREATININE 1.15 (H) 06/10/2023 1045   CALCIUM 9.3 06/10/2023 1045   GFRNONAA >60 01/24/2021 1449   Lab Results  Component Value Date   HGBA1C 5.7 (H) 06/10/2023   HGBA1C 5.7 01/15/2016   Lab Results  Component Value Date   INSULIN 16.6 06/10/2023   INSULIN 29.5 (H) 09/12/2021   Lab Results  Component Value Date   TSH 1.180 08/04/2023   CBC    Component Value Date/Time   WBC 9.2 11/04/2022 1120   WBC 11.2 (H) 11/07/2021 1843   RBC 4.40 11/04/2022 1120   RBC 4.09 11/07/2021 1843    HGB 13.1 11/04/2022 1120   HCT 40.1 11/04/2022 1120   PLT 266 11/04/2022 1120   MCV 91 11/04/2022 1120   MCH 29.8 11/04/2022 1120   MCH 28.6 11/07/2021 1843   MCHC 32.7 11/04/2022 1120   MCHC 32.3 11/07/2021 1843   RDW 12.9 11/04/2022 1120   Iron Studies No results found for: "IRON", "TIBC", "FERRITIN", "IRONPCTSAT" Lipid Panel     Component Value Date/Time   CHOL 164 06/10/2023 1045   TRIG 47 06/10/2023 1045   HDL 54 06/10/2023 1045   CHOLHDL 3 05/14/2022 1119   VLDL 8.0 05/14/2022 1119   LDLCALC 100 (H) 06/10/2023 1045   Hepatic Function Panel     Component Value Date/Time   PROT 7.2 06/10/2023 1045   ALBUMIN 4.2 06/10/2023 1045   AST 16 06/10/2023 1045   ALT 19 06/10/2023 1045   ALKPHOS 71 06/10/2023 1045   BILITOT 0.5 06/10/2023 1045      Component Value Date/Time   TSH 1.180 08/04/2023 1547   TSH 1.010 11/04/2022 1120   Nutritional Lab Results  Component Value Date   VD25OH 40.4 06/10/2023   VD25OH 34.8 03/04/2023   VD25OH 30.0 11/04/2022     ASSESSMENT AND PLAN  TREATMENT PLAN FOR OBESITY:  Recommended Dietary Goals  Chong is currently in the action stage of change. As such, her goal is to continue weight management plan. She has agreed to following a lower carbohydrate, vegetable and lean protein rich diet plan.  Behavioral Intervention  We discussed the following Behavioral Modification Strategies today: increasing lean protein intake to established goals, decreasing simple carbohydrates , increasing vegetables, increasing water intake , work on meal planning and preparation, reading food labels , keeping healthy foods at home, avoiding temptations and identifying enticing environmental cues, continue to work on implementation of reduced calorie nutritional plan, continue to practice mindfulness when eating, planning for success, and continue to work on maintaining a reduced calorie state, getting the recommended amount of protein, incorporating  whole foods, making healthy choices, staying well hydrated and practicing mindfulness when eating..  Additional resources provided today: NA  Recommended Physical Activity Goals  Jayleen has been advised to work up to 150 minutes of moderate intensity aerobic activity a week and strengthening exercises 2-3 times per week for cardiovascular health, weight loss maintenance and preservation of muscle mass.   She has agreed to Think about enjoyable ways to increase daily physical activity and overcoming barriers to exercise, Increase physical activity in their day and reduce sedentary time (increase NEAT)., and Work on scheduling and tracking physical activity.    Pharmacotherapy We discussed various medication options to help Maiya with her weight loss efforts and we both agreed to continue Qsymia dose 2.  Side effects discussed.  Patient is not to get pregnant while taking due to Topamax and birth defects.  ASSOCIATED CONDITIONS ADDRESSED TODAY  Action/Plan  Vitamin D deficiency -     Vitamin D (Ergocalciferol); Take 1 capsule (50,000 Units total) by mouth every 7 (seven) days.  Dispense: 5 capsule; Refill: 0  Prediabetes -     metFORMIN HCl; Take 1 tablet (500 mg total) by mouth daily with breakfast.  Dispense: 30 tablet; Refill: 0  Class 2 obesity due to excess calories without serious comorbidity with body mass index (BMI) of 39.0 to 39.9 in adult -     Qsymia; Take one capsule by mouth daily  Dispense: 30 capsule; Refill: 0      Will repeat labs in April or May   Return in about 4 weeks (around 10/28/2023).Marland Kitchen She was informed of the importance of frequent follow up visits to maximize her success with intensive lifestyle modifications for her multiple health conditions.   ATTESTASTION STATEMENTS:  Reviewed by clinician on day of visit: allergies, medications, problem list, medical history, surgical history, family history, social history, and previous encounter notes.      Theodis Sato. Velma Agnes FNP-C

## 2023-10-01 ENCOUNTER — Other Ambulatory Visit: Payer: Self-pay

## 2023-10-02 ENCOUNTER — Other Ambulatory Visit (HOSPITAL_COMMUNITY): Payer: Self-pay

## 2023-10-04 ENCOUNTER — Other Ambulatory Visit (HOSPITAL_COMMUNITY): Payer: Self-pay

## 2023-10-06 ENCOUNTER — Ambulatory Visit
Admission: RE | Admit: 2023-10-06 | Discharge: 2023-10-06 | Disposition: A | Discharge status: Home / Self Care | Source: Ambulatory Visit | Attending: Obstetrics and Gynecology

## 2023-10-06 ENCOUNTER — Other Ambulatory Visit: Payer: Self-pay | Admitting: Obstetrics and Gynecology

## 2023-10-06 DIAGNOSIS — R928 Other abnormal and inconclusive findings on diagnostic imaging of breast: Secondary | ICD-10-CM

## 2023-10-06 DIAGNOSIS — N6320 Unspecified lump in the left breast, unspecified quadrant: Secondary | ICD-10-CM

## 2023-10-07 ENCOUNTER — Other Ambulatory Visit (HOSPITAL_COMMUNITY): Payer: Self-pay

## 2023-10-14 ENCOUNTER — Other Ambulatory Visit (HOSPITAL_COMMUNITY): Payer: Self-pay

## 2023-10-14 ENCOUNTER — Ambulatory Visit
Admission: RE | Admit: 2023-10-14 | Discharge: 2023-10-14 | Disposition: A | Discharge status: Home / Self Care | Source: Ambulatory Visit | Attending: Obstetrics and Gynecology

## 2023-10-14 ENCOUNTER — Ambulatory Visit
Admission: RE | Admit: 2023-10-14 | Discharge: 2023-10-14 | Disposition: A | Discharge status: Home / Self Care | Source: Ambulatory Visit | Attending: Obstetrics and Gynecology | Admitting: Obstetrics and Gynecology

## 2023-10-14 DIAGNOSIS — R928 Other abnormal and inconclusive findings on diagnostic imaging of breast: Secondary | ICD-10-CM

## 2023-10-14 DIAGNOSIS — N6320 Unspecified lump in the left breast, unspecified quadrant: Secondary | ICD-10-CM

## 2023-10-14 HISTORY — PX: BREAST BIOPSY: SHX20

## 2023-10-15 LAB — SURGICAL PATHOLOGY

## 2023-10-28 ENCOUNTER — Other Ambulatory Visit: Payer: Self-pay

## 2023-10-28 ENCOUNTER — Ambulatory Visit: Admitting: Nurse Practitioner

## 2023-10-28 ENCOUNTER — Other Ambulatory Visit (HOSPITAL_COMMUNITY): Payer: Self-pay

## 2023-10-28 ENCOUNTER — Encounter: Payer: Self-pay | Admitting: Nurse Practitioner

## 2023-10-28 VITALS — BP 120/73 | HR 78 | Temp 97.6°F | Ht 62.0 in | Wt 213.0 lb

## 2023-10-28 DIAGNOSIS — R7303 Prediabetes: Secondary | ICD-10-CM

## 2023-10-28 DIAGNOSIS — E559 Vitamin D deficiency, unspecified: Secondary | ICD-10-CM | POA: Diagnosis not present

## 2023-10-28 DIAGNOSIS — Z6839 Body mass index (BMI) 39.0-39.9, adult: Secondary | ICD-10-CM

## 2023-10-28 DIAGNOSIS — M79672 Pain in left foot: Secondary | ICD-10-CM | POA: Diagnosis not present

## 2023-10-28 DIAGNOSIS — E66812 Obesity, class 2: Secondary | ICD-10-CM

## 2023-10-28 DIAGNOSIS — Z79899 Other long term (current) drug therapy: Secondary | ICD-10-CM

## 2023-10-28 MED ORDER — VITAMIN D (ERGOCALCIFEROL) 1.25 MG (50000 UNIT) PO CAPS
50000.0000 [IU] | ORAL_CAPSULE | ORAL | 0 refills | Status: DC
  Filled 2023-10-28: qty 5, 30d supply, fill #0
  Filled 2023-10-30: qty 4, 28d supply, fill #0

## 2023-10-28 MED ORDER — METFORMIN HCL 500 MG PO TABS
500.0000 mg | ORAL_TABLET | Freq: Every day | ORAL | 0 refills | Status: DC
  Filled 2023-10-28 – 2023-10-30 (×2): qty 30, 30d supply, fill #0

## 2023-10-28 MED ORDER — QSYMIA 7.5-46 MG PO CP24
1.0000 | ORAL_CAPSULE | Freq: Every day | ORAL | 0 refills | Status: DC
  Filled 2023-10-28 – 2023-11-12 (×3): qty 30, 30d supply, fill #0

## 2023-10-28 NOTE — Progress Notes (Signed)
 Office: [phone]  /  Fax: [phone]  WEIGHT SUMMARY AND BIOMETRICS  Weight Lost Since Last Visit: 2lb  Weight Gained Since Last Visit: 0lb   Vitals Temp: 97.6 F (36.4 C) BP: 120/73 Pulse Rate: 78 SpO2: 100 %   Anthropometric Measurements Height: 5\' 2"  (1.575 m) Weight: 213 lb (96.6 kg) BMI (Calculated): 38.95 Weight at Last Visit: 215lb Weight Lost Since Last Visit: 2lb Weight Gained Since Last Visit: 0lb Starting Weight: 245lb Total Weight Loss (lbs): 32 lb (14.5 kg)   Body Composition  Body Fat %: 44.9 % Fat Mass (lbs): 96 lbs Muscle Mass (lbs): 111.8 lbs Total Body Water (lbs): 82.8 lbs Visceral Fat Rating : 12   Other Clinical Data Fasting: No Labs: No Today's Visit #: 32 Starting Date: 09/12/21     HPI  Chief Complaint: OBESITY  Linda Mckinney is here to discuss her progress with her obesity treatment plan. She is on the practicing portion control and making smarter food choices, such as increasing vegetables and decreasing simple carbohydrates and states she is following her eating plan approximately 50 % of the time. She states she is exercising 0 minutes 0 days per week.   Interval History:  Since last office visit she has lost 2 pounds.  She is struggling with cravings and feels that has gotten worse.  She has been working one job since her last visit. She has had more time to meal prep and focus on herself.  She is drinking water daily.  She recently applied to the LPN program.  She is struggling with left foot pain.  Notes is worse based upon what she eats.  Has happened twice since her last visit.  She is concerned she has gout and is requesting labs today.  Denies trauma/injury to the foot.    Pharmacotherapy for weight loss: She is currently taking Qsymia dose 2 for medical weight loss.  Denies side effects.  Denies chest pains, shortness of breath or palpitations.   Previous pharmacotherapy for medical weight loss:   -Zepbound-stopped due  to cost/coverage-did well -She took Topamax and stopped due to side effects of fatigue.    Bariatric surgery:  Patient has not had bariatric surgery    Prediabetes Last A1c was 5.7  Medication(s): Metformin 500mg .  Denies side effects.   Polyphagia:Yes Lab Results  Component Value Date   HGBA1C 5.7 (H) 06/10/2023   HGBA1C 5.7 (H) 03/04/2023   HGBA1C 5.5 11/04/2022   HGBA1C 6.0 05/14/2022   HGBA1C 5.7 (H) 12/17/2021   Lab Results  Component Value Date   INSULIN 16.6 06/10/2023   INSULIN 11.9 03/04/2023   INSULIN 8.1 11/04/2022   INSULIN 12.2 12/17/2021   INSULIN 29.5 (H) 09/12/2021   Vit D deficiency  She is taking Vit D 50,000 IU weekly.  Denies side effects.  Denies nausea, vomiting or muscle weakness.    Lab Results  Component Value Date   VD25OH 40.4 06/10/2023   VD25OH 34.8 03/04/2023   VD25OH 30.0 11/04/2022     PHYSICAL EXAM:  Blood pressure 120/73, pulse 78, temperature 97.6 F (36.4 C), height 5\' 2"  (1.575 m), weight 213 lb (96.6 kg), last menstrual period 09/28/2023, SpO2 100%. Body mass index is 38.96 kg/m.  General: She is overweight, cooperative, alert, well developed, and in no acute distress. PSYCH: Has normal mood, affect and thought process.   Extremities: No edema.  Neurologic: No gross sensory or motor deficits. No tremors or fasciculations noted.    DIAGNOSTIC DATA REVIEWED:  BMET    Component Value Date/Time   NA 140 06/10/2023 1045   K 4.0 06/10/2023 1045   CL 106 06/10/2023 1045   CO2 21 06/10/2023 1045   GLUCOSE 93 06/10/2023 1045   GLUCOSE 93 05/14/2022 1119   BUN 18 06/10/2023 1045   CREATININE 1.15 (H) 06/10/2023 1045   CALCIUM 9.3 06/10/2023 1045   GFRNONAA >60 01/24/2021 1449   Lab Results  Component Value Date   HGBA1C 5.7 (H) 06/10/2023   HGBA1C 5.7 01/15/2016   Lab Results  Component Value Date   INSULIN 16.6 06/10/2023   INSULIN 29.5 (H) 09/12/2021   Lab Results  Component Value Date   TSH 1.180 08/04/2023    CBC    Component Value Date/Time   WBC 9.2 11/04/2022 1120   WBC 11.2 (H) 11/07/2021 1843   RBC 4.40 11/04/2022 1120   RBC 4.09 11/07/2021 1843   HGB 13.1 11/04/2022 1120   HCT 40.1 11/04/2022 1120   PLT 266 11/04/2022 1120   MCV 91 11/04/2022 1120   MCH 29.8 11/04/2022 1120   MCH 28.6 11/07/2021 1843   MCHC 32.7 11/04/2022 1120   MCHC 32.3 11/07/2021 1843   RDW 12.9 11/04/2022 1120   Iron Studies No results found for: "IRON", "TIBC", "FERRITIN", "IRONPCTSAT" Lipid Panel     Component Value Date/Time   CHOL 164 06/10/2023 1045   TRIG 47 06/10/2023 1045   HDL 54 06/10/2023 1045   CHOLHDL 3 05/14/2022 1119   VLDL 8.0 05/14/2022 1119   LDLCALC 100 (H) 06/10/2023 1045   Hepatic Function Panel     Component Value Date/Time   PROT 7.2 06/10/2023 1045   ALBUMIN 4.2 06/10/2023 1045   AST 16 06/10/2023 1045   ALT 19 06/10/2023 1045   ALKPHOS 71 06/10/2023 1045   BILITOT 0.5 06/10/2023 1045      Component Value Date/Time   TSH 1.180 08/04/2023 1547   TSH 1.010 11/04/2022 1120   Nutritional Lab Results  Component Value Date   VD25OH 40.4 06/10/2023   VD25OH 34.8 03/04/2023   VD25OH 30.0 11/04/2022     ASSESSMENT AND PLAN  TREATMENT PLAN FOR OBESITY:  Recommended Dietary Goals  Linda Mckinney is currently in the action stage of change. As such, her goal is to continue weight management plan. She has agreed to practicing portion control and making smarter food choices, such as increasing vegetables and decreasing simple carbohydrates.  Behavioral Intervention  We discussed the following Behavioral Modification Strategies today: increasing lean protein intake to established goals, decreasing simple carbohydrates , increasing vegetables, increasing fiber rich foods, increasing water intake , work on meal planning and preparation, reading food labels , keeping healthy foods at home, and continue to work on maintaining a reduced calorie state, getting the recommended  amount of protein, incorporating whole foods, making healthy choices, staying well hydrated and practicing mindfulness when eating..  Additional resources provided today: NA  Recommended Physical Activity Goals  Linda Mckinney has been advised to work up to 150 minutes of moderate intensity aerobic activity a week and strengthening exercises 2-3 times per week for cardiovascular health, weight loss maintenance and preservation of muscle mass.   She has agreed to Think about enjoyable ways to increase daily physical activity and overcoming barriers to exercise, Increase physical activity in their day and reduce sedentary time (increase NEAT)., and Work on scheduling and tracking physical activity.    Pharmacotherapy We discussed various medication options to help Simrah with her weight loss efforts and we both  agreed to continue Qsymia dose 2.  Side effects discussed.  ASSOCIATED CONDITIONS ADDRESSED TODAY  Action/Plan  Vitamin D deficiency -     Vitamin D (Ergocalciferol); Take 1 capsule (50,000 Units total) by mouth every 7 (seven) days.  Dispense: 5 capsule; Refill: 0  Prediabetes -     metFORMIN HCl; Take 1 tablet (500 mg total) by mouth daily with breakfast.  Dispense: 30 tablet; Refill: 0  Left foot pain -     Uric acid  Medication management -     Comprehensive metabolic panel with GFR  Class 2 obesity due to excess calories without serious comorbidity with body mass index (BMI) of 39.0 to 39.9 in adult -     Qsymia; Take one capsule by mouth daily  Dispense: 30 capsule; Refill: 0     Options discussed today: Increasing Qsymia to dose 3 Increasing Metformin to 500mg  BID Starting a GLP-1    Return in about 4 weeks (around 11/25/2023).Aaron Aas She was informed of the importance of frequent follow up visits to maximize her success with intensive lifestyle modifications for her multiple health conditions.   ATTESTASTION STATEMENTS:  Reviewed by clinician on day of visit: allergies,  medications, problem list, medical history, surgical history, family history, social history, and previous encounter notes.     Crist Dominion. Dantre Yearwood FNP-C

## 2023-10-28 NOTE — Patient Instructions (Signed)

## 2023-10-29 ENCOUNTER — Encounter: Payer: Self-pay | Admitting: Nurse Practitioner

## 2023-10-29 LAB — COMPREHENSIVE METABOLIC PANEL WITH GFR
ALT: 17 IU/L (ref 0–32)
AST: 17 IU/L (ref 0–40)
Albumin: 4.2 g/dL (ref 3.9–4.9)
Alkaline Phosphatase: 64 IU/L (ref 44–121)
BUN/Creatinine Ratio: 18 (ref 9–23)
BUN: 17 mg/dL (ref 6–24)
Bilirubin Total: 0.6 mg/dL (ref 0.0–1.2)
CO2: 22 mmol/L (ref 20–29)
Calcium: 9.6 mg/dL (ref 8.7–10.2)
Chloride: 106 mmol/L (ref 96–106)
Creatinine, Ser: 0.96 mg/dL (ref 0.57–1.00)
Globulin, Total: 2.9 g/dL (ref 1.5–4.5)
Glucose: 92 mg/dL (ref 70–99)
Potassium: 4.3 mmol/L (ref 3.5–5.2)
Sodium: 139 mmol/L (ref 134–144)
Total Protein: 7.1 g/dL (ref 6.0–8.5)
eGFR: 77 mL/min/{1.73_m2} (ref >59)

## 2023-10-29 LAB — URIC ACID: Uric Acid: 6.3 mg/dL — ABNORMAL HIGH (ref 2.6–6.2)

## 2023-10-29 NOTE — Progress Notes (Signed)
LMVM for patient to return call. 

## 2023-10-29 NOTE — Progress Notes (Signed)
 Patient informed. She has an appointment scheduled with her PCP 11/12/23.

## 2023-10-30 ENCOUNTER — Other Ambulatory Visit (HOSPITAL_COMMUNITY): Payer: Self-pay

## 2023-11-12 ENCOUNTER — Other Ambulatory Visit: Payer: Self-pay

## 2023-11-12 ENCOUNTER — Ambulatory Visit (INDEPENDENT_AMBULATORY_CARE_PROVIDER_SITE_OTHER)
Admission: RE | Admit: 2023-11-12 | Discharge: 2023-11-12 | Disposition: A | Discharge status: Home / Self Care | Source: Ambulatory Visit | Attending: Primary Care | Admitting: Primary Care

## 2023-11-12 ENCOUNTER — Encounter: Payer: Self-pay | Admitting: Primary Care

## 2023-11-12 ENCOUNTER — Ambulatory Visit: Admitting: Primary Care

## 2023-11-12 ENCOUNTER — Other Ambulatory Visit (HOSPITAL_COMMUNITY): Payer: Self-pay

## 2023-11-12 VITALS — BP 122/82 | HR 83 | Temp 98.0°F | Ht 62.0 in | Wt 221.0 lb

## 2023-11-12 DIAGNOSIS — G8929 Other chronic pain: Secondary | ICD-10-CM

## 2023-11-12 DIAGNOSIS — M79672 Pain in left foot: Secondary | ICD-10-CM | POA: Diagnosis not present

## 2023-11-12 LAB — C-REACTIVE PROTEIN: CRP: <1 mg/dL (ref 0.5–20.0)

## 2023-11-12 LAB — URIC ACID: Uric Acid, Serum: 6.5 mg/dL (ref 2.4–7.0)

## 2023-11-12 LAB — SEDIMENTATION RATE: Sed Rate: 17 mm/h (ref 0–20)

## 2023-11-12 NOTE — Patient Instructions (Signed)
Stop by the lab and xray prior to leaving today. I will notify you of your results once received.   It was a pleasure to see you today!   

## 2023-11-12 NOTE — Assessment & Plan Note (Signed)
 Differentials include arthritis/inflammation, tendonitis, gout. Less likely gout given description, but will keep on differentials list.  Checking labs today including repeat uric acid, CCP, RF, CRP, sed rate. Plain films of the left foot ordered and pending.  Consider podiatry referral.

## 2023-11-12 NOTE — Progress Notes (Signed)
 Subjective:    Patient ID: Linda Mckinney, female    DOB: Dec 05, 1985, 38 y.o.   MRN: [id number]  Foot Pain Associated symptoms include arthralgias. Pertinent negatives include no joint swelling.    Linda Mckinney is a very pleasant 38 y.o. female with a history of hypertension, prediabetes who presents today to discuss foot pain.   [facility]. Labs collected included uric acid and CMP due to left foot pain. Uric acid was mildly elevated at 6.3 so she was instructed to follow up with PCP.   Today she discusses intermittent left dorsal foot pain which began about 6 months ago. She notices her symptoms when eating pastries or donuts, but also with walking. She had to wear different shoes for a while due to tenderness to the top of her foot.   She has a history of bunionectomy years ago to the left medial foot. She has a family history of gout in her mother. She denies numbness, color changes, injury, a diet with wine/beer, organ meats., shellfish. Her last "flare" of pain was 2 weeks ago. She has no pain today.    Review of Systems  Musculoskeletal:  Positive for arthralgias. Negative for joint swelling.  Skin:  Negative for color change.         Past Medical History:  Diagnosis Date   Anxiety    GERD (gastroesophageal reflux disease)    Hypertension    Pre-diabetes     Social History   Socioeconomic History   Marital status: Married    Spouse name: Not on file   Number of children: 0   Years of education: Not on file   Highest education level: Associate degree: occupational, Scientist, product/process development, or vocational program  Occupational History    Comment: Nurse Tech  Tobacco Use   Smoking status: Never   Smokeless tobacco: Never  Vaping Use   Vaping status: Never Used  Substance and Sexual Activity   Alcohol use: No    Alcohol/week: 0.0 standard drinks of alcohol   Drug use: No   Sexual activity: Yes    Partners: Male     Birth control/protection: Condom  Other Topics Concern   Not on file  Social History Narrative   Single.   No children.    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    Social Drivers of Corporate investment banker Strain: Low Risk  (11/09/2023)   Overall Financial Resource Strain (CARDIA)    Difficulty of Paying Living Expenses: Not hard at all  Food Insecurity: No Food Insecurity (11/09/2023)   Hunger Vital Sign    Worried About Running Out of Food in the Last Year: Never true    Ran Out of Food in the Last Year: Never true  Transportation Needs: No Transportation Needs (11/09/2023)   PRAPARE - Administrator, Civil Service (Medical): No    Lack of Transportation (Non-Medical): No  Physical Activity: Unknown (11/09/2023)   Exercise Vital Sign    Days of Exercise per Week: 0 days    Minutes of Exercise per Session: Not on file  Stress: No Stress Concern Present (11/09/2023)   [employer] of Occupational Health - Occupational Stress Questionnaire    Feeling of Stress : Only a little  Social Connections: Socially Integrated (11/09/2023)   Social Connection and Isolation Panel [NHANES]    Frequency of Communication with Friends and Family: More than three times  a week    Frequency of Social Gatherings with Friends and Family: Once a week    Attends Religious Services: More than 4 times per year    Active Member of [employer] or Organizations: Yes    Attends Banker Meetings: Never    Marital Status: Married  Catering manager Violence: Not on file    Past Surgical History:  Procedure Laterality Date   BREAST BIOPSY Left 10/14/2023   US  LT BREAST BX W LOC DEV 1ST LESION IMG BX SPEC US  GUIDE 10/14/2023 [facility]   BUNIONECTOMY  2015   CARPAL TUNNEL RELEASE Right 01/30/2021   Procedure: RIGHT CARPAL TUNNEL RELEASE;  Surgeon: Lyanne Sample, MD;  Location:  [facility];  Service: Orthopedics;  Laterality: Right;  45 MIN    CHOLECYSTECTOMY      Family History  Problem Relation Age of Onset   Hypertension Mother    Depression Mother    Sleep apnea Mother    Obesity Mother    Diabetes Father    Stroke Father    Heart disease Father    Obesity Father    Hypertension Sister    Breast cancer Maternal Grandmother 40    No Known Allergies  Current Outpatient Medications on File Prior to Visit  Medication Sig Dispense Refill   docusate sodium  (COLACE) 100 MG capsule Take 1 capsule (100 mg total) by mouth 2 (two) times daily. 10 capsule 0   hydrochlorothiazide  (HYDRODIURIL ) 12.5 MG tablet Take 1 tablet (12.5 mg total) by mouth daily for blood pressure. 90 tablet 3   metFORMIN  (GLUCOPHAGE ) 500 MG tablet Take 1 tablet (500 mg total) by mouth daily with breakfast. 30 tablet 0   omeprazole (PRILOSEC) 20 MG capsule Take 20 mg by mouth daily.     Phentermine -Topiramate  (QSYMIA ) 7.5-46 MG CP24 Take one capsule by mouth daily 30 capsule 0   polyethylene glycol powder (GLYCOLAX /MIRALAX ) 17 GM/SCOOP powder Take 17 g by mouth daily as needed. 3350 g 1   Vitamin D , Ergocalciferol , (DRISDOL ) 1.25 MG (50000 UNIT) CAPS capsule Take 1 capsule (50,000 Units total) by mouth every 7 (seven) days. 5 capsule 0   ondansetron  (ZOFRAN -ODT) 4 MG disintegrating tablet Take 1 tablet (4 mg total) by mouth every 6 (six) hours as needed for nausea. (Patient not taking: Reported on 11/12/2023) 20 tablet 0   No current facility-administered medications on file prior to visit.    BP 122/82 (BP Location: Right Arm, Patient Position: Sitting, Cuff Size: Large)   Pulse 83   Temp 98 F (36.7 C) (Temporal)   Ht 5\' 2"  (1.575 m)   Wt 221 lb (100.2 kg)   SpO2 99%   BMI 40.42 kg/m  Objective:   Physical Exam Cardiovascular:     Rate and Rhythm: Normal rate.  Pulmonary:     Effort: Pulmonary effort is normal.  Musculoskeletal:     Left foot: Normal range of motion. No swelling, deformity, bunion, tenderness or bony tenderness. Normal  pulse.       Feet:  Skin:    General: Skin is warm and dry.     Findings: No erythema.  Neurological:     Mental Status: She is alert.           Assessment & Plan:  Chronic foot pain, left Assessment & Plan: Differentials include arthritis/inflammation, tendonitis, gout. Less likely gout given description, but will keep on differentials list.  Checking labs today including repeat uric acid, CCP, RF, CRP, sed rate. Plain  films of the left foot ordered and pending.  Consider podiatry referral.  Orders: -     Uric acid -     Sedimentation rate -     Cyclic citrul peptide antibody, IgG -     Rheumatoid factor -     C-reactive protein -     DG Foot Complete Left        Gabriel John, NP

## 2023-11-15 LAB — RHEUMATOID FACTOR: Rheumatoid fact SerPl-aCnc: <10 [IU]/mL (ref <14)

## 2023-11-15 LAB — CYCLIC CITRUL PEPTIDE ANTIBODY, IGG: Cyclic Citrullin Peptide Ab: <16 U

## 2023-11-21 ENCOUNTER — Ambulatory Visit (INDEPENDENT_AMBULATORY_CARE_PROVIDER_SITE_OTHER): Payer: BC Managed Care – PPO | Admitting: Endocrinology

## 2023-11-21 ENCOUNTER — Encounter: Payer: Self-pay | Admitting: Endocrinology

## 2023-11-21 VITALS — BP 130/80 | HR 70 | Resp 20 | Ht 62.0 in | Wt 214.2 lb

## 2023-11-21 DIAGNOSIS — E042 Nontoxic multinodular goiter: Secondary | ICD-10-CM

## 2023-11-21 DIAGNOSIS — E041 Nontoxic single thyroid nodule: Secondary | ICD-10-CM

## 2023-11-21 NOTE — Progress Notes (Signed)
 Outpatient Endocrinology Note [place] Roylee Chaffin, MD   Patient's Name: Linda Mckinney    DOB: [birth year]    MRN: [id number]  REASON OF VISIT: New consult for thyroid  nodules   PCP: Gabriel John, NP  REFERRING PROVIDER:  Gabriel John, NP  HISTORY OF PRESENT ILLNESS:   Linda Mckinney is a 38 y.o. old female with past medical history as listed below is presented for new consult for thyroid  nodules.  Pertinent Thyroid  History:  Patient was found to have enlarged thyroid  on physical exam by primary care provider and subsequently had ultrasound thyroid  in August 18, 2023 showed bilateral thyroid  nodules right superior thyroid  nodule heterogeneous/hypoechoic measuring 1.7 cm in maximal dimension, solid and left isthmus complex nodule measuring 1.4 cm. - Patient denies compressive symptoms including dysphagia, shortness of breath, neck pain or voice changes. - Patient denies hypo or hyperthyroid symptoms including heat or cold intolerance, sweating, palpitation, significant weight loss or gain, fatigue, change in bowel movements, hair loss, skin changes. - Patient denies family h/o thyroid  cancer.  Patient has family history of thyroid  nodules with mother and father however no thyroid  cancer. - Patient denies h/o significant radiation exposure or radiation treatment in the neck. - Thyroid  US  on August 18, 2023: reviewed images.   CLINICAL DATA:  enlarged thyroid  on physical exam   EXAM: THYROID  ULTRASOUND   TECHNIQUE: Ultrasound examination of the thyroid  gland and adjacent soft tissues was performed.   COMPARISON:  None Available.   FINDINGS: Parenchymal Echotexture: Mildly heterogenous   Isthmus: 5 mm   Right lobe: 3.9 x 1.9 x 1.8 cm   Left lobe: 4.5 x 1.6 x 1.9 cm   _________________________________________________________   Estimated total number of nodules >/= 1 cm: 2   Number of spongiform nodules >/=  2 cm not described below (TR1): 0   Number of mixed  cystic and solid nodules >/= 1.5 cm not described below (TR2): 0   _________________________________________________________   Nodule # 1:   Location: Right; Superior   Maximum size: 1.7 cm; Other 2 dimensions: 1.2 x 1.0 cm   Composition: solid/almost completely solid (2)   Echogenicity: hypoechoic (2)   Shape: not taller-than-wide (0)   Margins: ill-defined (0)   Echogenic foci: none (0)   ACR TI-RADS total points: 4.   ACR TI-RADS risk category: TR4 (4-6 points).   ACR TI-RADS recommendations:   **Given size (>/= 1.5 cm) and appearance, fine needle aspiration of this moderately suspicious nodule should be considered based on TI-RADS criteria.   _________________________________________________________   Nodule # 2:   Location: Isthmus; left   Maximum size: 1.4 cm; Other 2 dimensions: 1.2 x 1.2 cm   Composition: mixed cystic and solid (1)   Echogenicity: isoechoic (1)   Shape: not taller-than-wide (0)   Margins: ill-defined (0)   Echogenic foci: none (0)   ACR TI-RADS total points: 2.   ACR TI-RADS risk category: TR2 (2 points).   ACR TI-RADS recommendations:   This nodule does NOT meet TI-RADS criteria for biopsy or dedicated follow-up.   _________________________________________________________   Nodule 3 represents a mixed cystic/solid TR 2 type nodule measuring only 8 mm. No follow-up or biopsy recommended.   No regional adenopathy.   IMPRESSION: 1.7 cm right superior TR 4 nodule meets criteria for biopsy as above.   - Labs: She is euthyroid not on thyroid  medication.  Latest Reference Range & Units 09/12/21 10:07 11/04/22 11:20 08/04/23 15:47  TSH 0.450 - 4.500 uIU/mL 1.140  1.010 1.180  Triiodothyronine,Free,Serum 2.0 - 4.4 pg/mL 2.7    T4,Free(Direct) 0.82 - 1.77 ng/dL 2.84     Interval history  Patient presented to establish endocrinology care for evaluation and management of recently diagnosed multiple thyroid  nodules.  REVIEW OF  SYSTEMS:  As per history of present illness.   PAST MEDICAL HISTORY: Past Medical History:  Diagnosis Date   Anxiety    GERD (gastroesophageal reflux disease)    Hypertension    Pre-diabetes     PAST SURGICAL HISTORY: Past Surgical History:  Procedure Laterality Date   BREAST BIOPSY Left 10/14/2023   US  LT BREAST BX W LOC DEV 1ST LESION IMG BX SPEC US  GUIDE 10/14/2023 [facility]   BUNIONECTOMY  2015   CARPAL TUNNEL RELEASE Right 01/30/2021   Procedure: RIGHT CARPAL TUNNEL RELEASE;  Surgeon: Lyanne Sample, MD;  Location: [facility];  Service: Orthopedics;  Laterality: Right;  45 MIN   CHOLECYSTECTOMY      ALLERGIES: No Known Allergies  FAMILY HISTORY:  Family History  Problem Relation Age of Onset   Hypertension Mother    Depression Mother    Sleep apnea Mother    Obesity Mother    Diabetes Father    Stroke Father    Heart disease Father    Obesity Father    Hypertension Sister    Breast cancer Maternal Grandmother 98    SOCIAL HISTORY: Social History   Socioeconomic History   Marital status: Married    Spouse name: Not on file   Number of children: 0   Years of education: Not on file   Highest education level: Associate degree: occupational, Scientist, product/process development, or vocational program  Occupational History    Comment: Nurse Tech  Tobacco Use   Smoking status: Never   Smokeless tobacco: Never  Vaping Use   Vaping status: Never Used  Substance and Sexual Activity   Alcohol use: No    Alcohol/week: 0.0 standard drinks of alcohol   Drug use: No   Sexual activity: Yes    Partners: Male    Birth control/protection: Condom  Other Topics Concern   Not on file  Social History Narrative   Single.   No children.    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    Social Drivers of Corporate investment banker Strain: Low Risk  (11/09/2023)   Overall Financial Resource Strain (CARDIA)    Difficulty of Paying Living Expenses: Not hard at  all  Food Insecurity: No Food Insecurity (11/09/2023)   Hunger Vital Sign    Worried About Running Out of Food in the Last Year: Never true    Ran Out of Food in the Last Year: Never true  Transportation Needs: No Transportation Needs (11/09/2023)   PRAPARE - Administrator, Civil Service (Medical): No    Lack of Transportation (Non-Medical): No  Physical Activity: Unknown (11/09/2023)   Exercise Vital Sign    Days of Exercise per Week: 0 days    Minutes of Exercise per Session: Not on file  Stress: No Stress Concern Present (11/09/2023)   [employer] of Occupational Health - Occupational Stress Questionnaire    Feeling of Stress : Only a little  Social Connections: Socially Integrated (11/09/2023)   Social Connection and Isolation Panel [NHANES]    Frequency of Communication with Friends and Family: More than three times a week    Frequency of Social Gatherings with Friends and Family: Once a  week    Attends Religious Services: More than 4 times per year    Active Member of Clubs or Organizations: Yes    Attends Banker Meetings: Never    Marital Status: Married    MEDICATIONS:  Current Outpatient Medications  Medication Sig Dispense Refill   docusate sodium  (COLACE) 100 MG capsule Take 1 capsule (100 mg total) by mouth 2 (two) times daily. 10 capsule 0   hydrochlorothiazide  (HYDRODIURIL ) 12.5 MG tablet Take 1 tablet (12.5 mg total) by mouth daily for blood pressure. 90 tablet 3   metFORMIN  (GLUCOPHAGE ) 500 MG tablet Take 1 tablet (500 mg total) by mouth daily with breakfast. 30 tablet 0   omeprazole (PRILOSEC) 20 MG capsule Take 20 mg by mouth daily.     ondansetron  (ZOFRAN -ODT) 4 MG disintegrating tablet Take 1 tablet (4 mg total) by mouth every 6 (six) hours as needed for nausea. 20 tablet 0   Phentermine -Topiramate  (QSYMIA ) 7.5-46 MG CP24 Take one capsule by mouth daily 30 capsule 0   polyethylene glycol powder (GLYCOLAX /MIRALAX ) 17 GM/SCOOP powder  Take 17 g by mouth daily as needed. 3350 g 1   Vitamin D , Ergocalciferol , (DRISDOL ) 1.25 MG (50000 UNIT) CAPS capsule Take 1 capsule (50,000 Units total) by mouth every 7 (seven) days. 5 capsule 0   No current facility-administered medications for this visit.    PHYSICAL EXAM: Vitals:   11/21/23 0904  BP: 130/80  Pulse: 70  Resp: 20  SpO2: 99%  Weight: 214 lb 3.2 oz (97.2 kg)  Height: 5\' 2"  (1.575 m)   Body mass index is 39.18 kg/m.  Wt Readings from Last 3 Encounters:  11/21/23 214 lb 3.2 oz (97.2 kg)  11/12/23 221 lb (100.2 kg)  10/28/23 213 lb (96.6 kg)     General: Well developed, well nourished female in no apparent distress.  HEENT: AT/[place], no external lesions. Hearing intact to the spoken word Eyes: EOMI. No stare, proptosis. Conjunctiva clear and no icterus. Neck: Trachea midline, neck supple with mild appreciable thyromegaly or no lymphadenopathy and b/l palpable thyroid  nodules Lungs: Clear to auscultation, no wheeze. Respirations not labored Heart: S1S2, Regular in rate and rhythm.  Abdomen: Soft, non tender, non distended Neurologic: Alert, oriented, normal speech, deep tendon biceps reflexes normal,  no gross focal neurological deficit Extremities: No pedal pitting edema, no tremors of outstretched hands Skin: Warm, color good.  Psychiatric: Does not appear depressed or anxious  PERTINENT HISTORIC LABORATORY AND IMAGING STUDIES:  All pertinent laboratory results were reviewed. Please see HPI also for further details.   TSH  Date Value Ref Range Status  08/04/2023 1.180 0.450 - 4.500 uIU/mL Final  11/04/2022 1.010 0.450 - 4.500 uIU/mL Final  09/12/2021 1.140 0.450 - 4.500 uIU/mL Final     ASSESSMENT / PLAN  1. Right thyroid  nodule   2. Multiple thyroid  nodules    - Patient was found to have enlarged thyroid  on physical exam, subsequently had ultrasound thyroid  in February 2025 showed bilateral thyroid  nodules including a right solid/heterogenous thyroid   nodule measuring 1.7 cm mildly hypoechoic and left isthmus complex thyroid  nodule measuring 1.4 cm. -Based on ATA guideline right solid thyroid  nodule meets criteria for FNA. - No compressive symptoms, euthyroid, no family h/o thyroid  cancer, no h/o radiation exposure.  Plan: - Will refer to IR for FNA of right thyroid  nodule measuring 1.7 cm. - Provided cytology is benign will repeat ultrasound thyroid  in 1 year around spring 2026.  Thyroid  nodule / FNA talk: -Approximately 5%  of individuals have palpable thyroid  nodules and 30% or more of adults have non-palpable nodules. The prevalence of nodules increases with age, so that perhaps 50% of individuals older than 40 years of age have nodules. Many of these nodules will be well under 1cm in size.  -The incidence of thyroid  cancer is low, but rising. The prevalence of thyroid  cancer is low compared with the prevalence of thyroid  nodule.  -There have been a number of studies that have estimated risk of malignancy in thyroid  nodule. The estimated risk varies with size and other characteristics of nodules selected for biopsy, the technique used for the biopsy, the institution and its referral patterns, and the characteristics of the local population. Most studies have estimated malignancy risk in nodules that are palpable or > 1cm on U/S to be in the range of 3-15%.    - In general, options for further evaluation include: - Follow with serial U/S - Fine needle aspiration biopsy - Thyroidectomy - In general the criteria for FNA biopsy includes:  - All palpable nodules - Generally nodules > 1 and for some nodules up to > 1 to 2 cm, based on other criteria.  - Rarely Nodules > 8-52mm in two or more dimensions with high risk sonographic features, or occuring in patients with high risk historical features. Nodules not meeting the above criteria can generally be followed with serial ultrasounds.  -Thyroid  FNA is required for further evaluation of nodule  to see if suspicious for cancer which may require further surgical treatment. -There are false positive and false negative results with FNA and need for repeat FNA or surgical resection in some cases and also possibility of missing a diagnosis of Cancer in 5-15% of cases. -discussed the risk and benefits of procedure and potential complications such as bleeding, pain in neck, swelling in neck in rare cases due to hematoma formation and causing respiratory compromise, possibility of infection etc. -discussed the need for continued follow up even if the nodule were found to be benign on FNA.  -discussed that the only near 100% method of ruling out thyroid  cancer is by surgical resection. -patient agreeable for Thyroid  FNA.   You may read about thyroid  nodule from this link of American Thyroid  Association.  https://www.thyroid .org/thyroid -nodules/  I provided ATA handout about thyroid  nodules.  Diagnoses and all orders for this visit:  Right thyroid  nodule -     US  FNA BX THYROID  1ST LESION AFIRMA; Future  Multiple thyroid  nodules   DISPOSITION Follow up in clinic in 12 months suggested.  All questions answered and patient verbalized understanding of the plan.  [place] Duward Allbritton, MD [facility] Endocrinology [facility] [address] [place], [address] [place], [place] [address] Phone # [phone]  At least part of this note was generated using voice recognition software. Inadvertent word errors may have occurred, which were not recognized during the proofreading process.

## 2023-11-25 ENCOUNTER — Encounter: Payer: Self-pay | Admitting: Nurse Practitioner

## 2023-11-25 ENCOUNTER — Ambulatory Visit: Admitting: Nurse Practitioner

## 2023-11-25 VITALS — BP 126/84 | HR 69 | Temp 97.9°F | Ht 62.0 in | Wt 210.0 lb

## 2023-11-25 DIAGNOSIS — E66812 Obesity, class 2: Secondary | ICD-10-CM | POA: Diagnosis not present

## 2023-11-25 DIAGNOSIS — Z6838 Body mass index (BMI) 38.0-38.9, adult: Secondary | ICD-10-CM | POA: Diagnosis not present

## 2023-11-25 DIAGNOSIS — E041 Nontoxic single thyroid nodule: Secondary | ICD-10-CM

## 2023-11-25 NOTE — Progress Notes (Signed)
 Office: [phone]  /  Fax: [phone]  WEIGHT SUMMARY AND BIOMETRICS  Weight Lost Since Last Visit: 3lb  Weight Gained Since Last Visit: 0lb   Vitals Temp: 97.9 F (36.6 C) BP: 126/84 Pulse Rate: 69 SpO2: 100 %   Anthropometric Measurements Height: 5\' 2"  (1.575 m) Weight: 210 lb (95.3 kg) BMI (Calculated): 38.4 Weight at Last Visit: 213lb Weight Lost Since Last Visit: 3lb Weight Gained Since Last Visit: 0lb Starting Weight: 245lb Total Weight Loss (lbs): 35 lb (15.9 kg)   Body Composition  Body Fat %: 44.2 % Fat Mass (lbs): 92.8 lbs Muscle Mass (lbs): 111.2 lbs Total Body Water (lbs): 81.8 lbs Visceral Fat Rating : 11   Other Clinical Data Fasting: Yes Labs: Yes Today's Visit #: 20 Starting Date: 09/12/21     HPI  Chief Complaint: OBESITY  Linda Mckinney is here to discuss her progress with her obesity treatment plan. She is on the practicing portion control and making smarter food choices, such as increasing vegetables and decreasing simple carbohydrates and states she is following her eating plan approximately 80 % of the time. She states she is exercising 0 minutes 0 days per week.   Interval History:  Since last office visit she has lost 3 pounds.  She is not skipping meals but has to make herself eat. She is eating a protein with each meal.  She is drinking water and a protein shake daily.    Pharmacotherapy for weight loss: She is currently taking Qsymia  dose 2 for medical weight loss.  Denies side effects.  Denies chest pains, shortness of breath or palpitations.  She is also taking her sister's Mounjaro  5mg  x 2 doses (gave to her after her last visit).  She gave her a total of 8 injections. Reports side effects of nausea and reflux that is improving.    Previous pharmacotherapy for medical weight loss:   -Zepbound -stopped due to cost/coverage-did well -She took Topamax  and stopped due to side effects of fatigue.    Bariatric surgery:  Patient  has not had bariatric surgery    Thyroid  nodule Saw endo on 11/21/23 and has appt for biopsy on 12/22/23  PHYSICAL EXAM:  Blood pressure 126/84, pulse 69, temperature 97.9 F (36.6 C), height 5\' 2"  (1.575 m), weight 210 lb (95.3 kg), last menstrual period 11/22/2023, SpO2 100%. Body mass index is 38.41 kg/m.  General: She is overweight, cooperative, alert, well developed, and in no acute distress. PSYCH: Has normal mood, affect and thought process.   Extremities: No edema.  Neurologic: No gross sensory or motor deficits. No tremors or fasciculations noted.    DIAGNOSTIC DATA REVIEWED:  BMET    Component Value Date/Time   NA 139 10/28/2023 1204   K 4.3 10/28/2023 1204   CL 106 10/28/2023 1204   CO2 22 10/28/2023 1204   GLUCOSE 92 10/28/2023 1204   GLUCOSE 93 05/14/2022 1119   BUN 17 10/28/2023 1204   CREATININE 0.96 10/28/2023 1204   CALCIUM 9.6 10/28/2023 1204   GFRNONAA >60 01/24/2021 1449   Lab Results  Component Value Date   HGBA1C 5.7 (H) 06/10/2023   HGBA1C 5.7 01/15/2016   Lab Results  Component Value Date   INSULIN  16.6 06/10/2023   INSULIN  29.5 (H) 09/12/2021   Lab Results  Component Value Date   TSH 1.180 08/04/2023   CBC    Component Value Date/Time   WBC 9.2 11/04/2022 1120   WBC 11.2 (H) 11/07/2021 1843   RBC 4.40 11/04/2022 1120  RBC 4.09 11/07/2021 1843   HGB 13.1 11/04/2022 1120   HCT 40.1 11/04/2022 1120   PLT 266 11/04/2022 1120   MCV 91 11/04/2022 1120   MCH 29.8 11/04/2022 1120   MCH 28.6 11/07/2021 1843   MCHC 32.7 11/04/2022 1120   MCHC 32.3 11/07/2021 1843   RDW 12.9 11/04/2022 1120   Iron Studies No results found for: "IRON", "TIBC", "FERRITIN", "IRONPCTSAT" Lipid Panel     Component Value Date/Time   CHOL 164 06/10/2023 1045   TRIG 47 06/10/2023 1045   HDL 54 06/10/2023 1045   CHOLHDL 3 05/14/2022 1119   VLDL 8.0 05/14/2022 1119   LDLCALC 100 (H) 06/10/2023 1045   Hepatic Function Panel     Component Value Date/Time    PROT 7.1 10/28/2023 1204   ALBUMIN 4.2 10/28/2023 1204   AST 17 10/28/2023 1204   ALT 17 10/28/2023 1204   ALKPHOS 64 10/28/2023 1204   BILITOT 0.6 10/28/2023 1204      Component Value Date/Time   TSH 1.180 08/04/2023 1547   TSH 1.010 11/04/2022 1120   Nutritional Lab Results  Component Value Date   VD25OH 40.4 06/10/2023   VD25OH 34.8 03/04/2023   VD25OH 30.0 11/04/2022     ASSESSMENT AND PLAN  TREATMENT PLAN FOR OBESITY:  Recommended Dietary Goals  Linda Mckinney is currently in the action stage of change. As such, her goal is to continue weight management plan. She has agreed to practicing portion control and making smarter food choices, such as increasing vegetables and decreasing simple carbohydrates.  Behavioral Intervention  We discussed the following Behavioral Modification Strategies today: increasing lean protein intake to established goals, decreasing simple carbohydrates , increasing fiber rich foods, increasing water intake , and continue to work on maintaining a reduced calorie state, getting the recommended amount of protein, incorporating whole foods, making healthy choices, staying well hydrated and practicing mindfulness when eating..  Additional resources provided today: NA  Recommended Physical Activity Goals  Linda Mckinney has been advised to work up to 150 minutes of moderate intensity aerobic activity a week and strengthening exercises 2-3 times per week for cardiovascular health, weight loss maintenance and preservation of muscle mass.   She has agreed to Think about enjoyable ways to increase daily physical activity and overcoming barriers to exercise, Increase physical activity in their day and reduce sedentary time (increase NEAT)., and Work on scheduling and tracking physical activity.    Plans to start walking with her family  Pharmacotherapy We discussed various medication options to help Linda Mckinney with her weight loss efforts and we both agreed to stop  Qsymia  while taking Mounjaro . She plans to continue what her sister has given her.  Will restart if she decides not to continue Mounjaro /Zepbound  and based upon thyroid  biopsy results.  She did not tell endo that she was taking Mounjaro .  I've asked her to reach out to endo to let him know.    ASSOCIATED CONDITIONS ADDRESSED TODAY  Action/Plan  Thyroid  nodule Keep follow up appt with endo and biopsy.    Class 2 obesity due to excess calories without serious comorbidity with body mass index (BMI) of 38.0 to 38.9 in adult Stop Qsymia .          Return in about 4 weeks (around 12/23/2023).Aaron Aas She was informed of the importance of frequent follow up visits to maximize her success with intensive lifestyle modifications for her multiple health conditions.   ATTESTASTION STATEMENTS:  Reviewed by clinician on day of visit: allergies, medications, problem  list, medical history, surgical history, family history, social history, and previous encounter notes.   Time spent on visit including pre-visit chart review and post-visit care and charting was 30 minutes discussing meal plan, Qsymia , Mounjaro  and exercise.    Crist Dominion. Tomi Paddock FNP-C

## 2023-12-15 ENCOUNTER — Ambulatory Visit
Admission: RE | Admit: 2023-12-15 | Discharge: 2023-12-15 | Disposition: A | Discharge status: Home / Self Care | Source: Ambulatory Visit | Attending: Endocrinology | Admitting: Endocrinology

## 2023-12-15 ENCOUNTER — Other Ambulatory Visit (HOSPITAL_COMMUNITY)
Admission: RE | Admit: 2023-12-15 | Discharge: 2023-12-15 | Disposition: A | Discharge status: Home / Self Care | Source: Ambulatory Visit | Attending: Student | Admitting: Student

## 2023-12-15 DIAGNOSIS — E041 Nontoxic single thyroid nodule: Secondary | ICD-10-CM | POA: Diagnosis present

## 2023-12-17 ENCOUNTER — Ambulatory Visit: Payer: Self-pay | Admitting: Endocrinology

## 2023-12-17 ENCOUNTER — Other Ambulatory Visit: Payer: Self-pay | Admitting: Endocrinology

## 2023-12-17 DIAGNOSIS — E041 Nontoxic single thyroid nodule: Secondary | ICD-10-CM

## 2023-12-17 LAB — CYTOLOGY - NON PAP

## 2023-12-22 ENCOUNTER — Inpatient Hospital Stay: Admission: RE | Admit: 2023-12-22 | Source: Ambulatory Visit

## 2023-12-30 ENCOUNTER — Other Ambulatory Visit (HOSPITAL_COMMUNITY): Payer: Self-pay

## 2023-12-30 ENCOUNTER — Ambulatory Visit: Admitting: Nurse Practitioner

## 2023-12-30 ENCOUNTER — Other Ambulatory Visit: Payer: Self-pay

## 2023-12-30 ENCOUNTER — Encounter: Payer: Self-pay | Admitting: Nurse Practitioner

## 2023-12-30 VITALS — BP 120/85 | HR 63 | Temp 97.6°F | Ht 62.0 in | Wt 208.0 lb

## 2023-12-30 DIAGNOSIS — E559 Vitamin D deficiency, unspecified: Secondary | ICD-10-CM | POA: Diagnosis not present

## 2023-12-30 DIAGNOSIS — E6609 Other obesity due to excess calories: Secondary | ICD-10-CM

## 2023-12-30 DIAGNOSIS — E041 Nontoxic single thyroid nodule: Secondary | ICD-10-CM

## 2023-12-30 DIAGNOSIS — R7303 Prediabetes: Secondary | ICD-10-CM | POA: Diagnosis not present

## 2023-12-30 DIAGNOSIS — E66812 Obesity, class 2: Secondary | ICD-10-CM

## 2023-12-30 DIAGNOSIS — Z6838 Body mass index (BMI) 38.0-38.9, adult: Secondary | ICD-10-CM

## 2023-12-30 DIAGNOSIS — I10 Essential (primary) hypertension: Secondary | ICD-10-CM

## 2023-12-30 MED ORDER — METFORMIN HCL 500 MG PO TABS
500.0000 mg | ORAL_TABLET | Freq: Every day | ORAL | 0 refills | Status: DC
  Filled 2023-12-30: qty 30, 30d supply, fill #0

## 2023-12-30 MED ORDER — VITAMIN D (ERGOCALCIFEROL) 1.25 MG (50000 UNIT) PO CAPS
50000.0000 [IU] | ORAL_CAPSULE | ORAL | 0 refills | Status: DC
  Filled 2023-12-30: qty 4, 28d supply, fill #0

## 2023-12-30 NOTE — Progress Notes (Signed)
 Office: [phone]  /  Fax: [phone]  WEIGHT SUMMARY AND BIOMETRICS  Weight Lost Since Last Visit: 2lb  Weight Gained Since Last Visit: 0lb   Vitals Temp: 97.6 F (36.4 C) BP: 120/85 Pulse Rate: 63 SpO2: 100 %   Anthropometric Measurements Height: 5' 2 (1.575 m) Weight: 208 lb (94.3 kg) BMI (Calculated): 38.03 Weight at Last Visit: 210lb Weight Lost Since Last Visit: 2lb Weight Gained Since Last Visit: 0lb Starting Weight: 245lb Total Weight Loss (lbs): 37 lb (16.8 kg)   Body Composition  Body Fat %: 45.1 % Fat Mass (lbs): 94 lbs Muscle Mass (lbs): 108.6 lbs Total Body Water (lbs): 82.6 lbs Visceral Fat Rating : 11   Other Clinical Data Fasting: Yes Labs: No Today's Visit #: 34 Starting Date: 09/12/21     HPI  Chief Complaint: OBESITY  Linda Mckinney is here to discuss her progress with her obesity treatment plan. She is on the practicing portion control and making smarter food choices, such as increasing vegetables and decreasing simple carbohydrates and states she is following her eating plan approximately 80 % of the time. She states she is exercising 30 minutes 1 days per week.   Interval History:  Since last office visit she has lost 2 pounds. It has been over 20 years since she weighed < 200 lbs. She has decreased from an XL-2X to a medium in scrub pants. She is not skipping meals and is getting a protein with each meal.  She is drinking water, a protein shake and tea daily.    Pharmacotherapy for weight loss: She is currently taking Mounjaro  5mg  off label for medical weight loss.  Denies side effects.    She is not currently taking Qsymia  since she is taking Mounjaro    Previous pharmacotherapy for medical weight loss:   -Zepbound -stopped due to cost/coverage-did well -She took Topamax  and stopped due to side effects of fatigue.    Bariatric surgery:  Patient has not had bariatric surgery   Thyroid  nodule biopsy on 12/22/23-benign follicular  nodule-has follow up visit scheduled with endo May 2026  Vit D deficiency  She is taking Vit D 50,000 IU weekly.  Denies side effects.  Denies nausea, vomiting or muscle weakness.    Lab Results  Component Value Date   VD25OH 40.4 06/10/2023   VD25OH 34.8 03/04/2023   VD25OH 30.0 11/04/2022    Prediabetes Last A1c was 5.7  Medication(s): Metformin  500mg .  Denies side effects.  Polyphagia:Yes Lab Results  Component Value Date   HGBA1C 5.7 (H) 06/10/2023   HGBA1C 5.7 (H) 03/04/2023   HGBA1C 5.5 11/04/2022   HGBA1C 6.0 05/14/2022   HGBA1C 5.7 (H) 12/17/2021   Lab Results  Component Value Date   INSULIN  16.6 06/10/2023   INSULIN  11.9 03/04/2023   INSULIN  8.1 11/04/2022   INSULIN  12.2 12/17/2021   INSULIN  29.5 (H) 09/12/2021   Hypertension Hypertension stable.  Medication(s): HCTZ 12.5mg .  Feels that her BP is dropping due to some dizziness.   Denies chest pain, palpitations and SOB.  BP Readings from Last 3 Encounters:  12/30/23 120/85  11/25/23 126/84  11/21/23 130/80   Lab Results  Component Value Date   CREATININE 0.96 10/28/2023   CREATININE 1.15 (H) 06/10/2023   CREATININE 1.07 (H) 04/01/2023    PHYSICAL EXAM:  Blood pressure 120/85, pulse 63, temperature 97.6 F (36.4 C), height 5' 2 (1.575 m), weight 208 lb (94.3 kg), last menstrual period 12/23/2023, SpO2 100%. Body mass index is 38.04 kg/m.  General:  She is overweight, cooperative, alert, well developed, and in no acute distress. PSYCH: Has normal mood, affect and thought process.   Extremities: No edema.  Neurologic: No gross sensory or motor deficits. No tremors or fasciculations noted.    DIAGNOSTIC DATA REVIEWED:  BMET    Component Value Date/Time   NA 139 10/28/2023 1204   K 4.3 10/28/2023 1204   CL 106 10/28/2023 1204   CO2 22 10/28/2023 1204   GLUCOSE 92 10/28/2023 1204   GLUCOSE 93 05/14/2022 1119   BUN 17 10/28/2023 1204   CREATININE 0.96 10/28/2023 1204   CALCIUM 9.6 10/28/2023  1204   GFRNONAA >60 01/24/2021 1449   Lab Results  Component Value Date   HGBA1C 5.7 (H) 06/10/2023   HGBA1C 5.7 01/15/2016   Lab Results  Component Value Date   INSULIN  16.6 06/10/2023   INSULIN  29.5 (H) 09/12/2021   Lab Results  Component Value Date   TSH 1.180 08/04/2023   CBC    Component Value Date/Time   WBC 9.2 11/04/2022 1120   WBC 11.2 (H) 11/07/2021 1843   RBC 4.40 11/04/2022 1120   RBC 4.09 11/07/2021 1843   HGB 13.1 11/04/2022 1120   HCT 40.1 11/04/2022 1120   PLT 266 11/04/2022 1120   MCV 91 11/04/2022 1120   MCH 29.8 11/04/2022 1120   MCH 28.6 11/07/2021 1843   MCHC 32.7 11/04/2022 1120   MCHC 32.3 11/07/2021 1843   RDW 12.9 11/04/2022 1120   Iron Studies No results found for: IRON, TIBC, FERRITIN, IRONPCTSAT Lipid Panel     Component Value Date/Time   CHOL 164 06/10/2023 1045   TRIG 47 06/10/2023 1045   HDL 54 06/10/2023 1045   CHOLHDL 3 05/14/2022 1119   VLDL 8.0 05/14/2022 1119   LDLCALC 100 (H) 06/10/2023 1045   Hepatic Function Panel     Component Value Date/Time   PROT 7.1 10/28/2023 1204   ALBUMIN 4.2 10/28/2023 1204   AST 17 10/28/2023 1204   ALT 17 10/28/2023 1204   ALKPHOS 64 10/28/2023 1204   BILITOT 0.6 10/28/2023 1204      Component Value Date/Time   TSH 1.180 08/04/2023 1547   TSH 1.010 11/04/2022 1120   Nutritional Lab Results  Component Value Date   VD25OH 40.4 06/10/2023   VD25OH 34.8 03/04/2023   VD25OH 30.0 11/04/2022     ASSESSMENT AND PLAN  TREATMENT PLAN FOR OBESITY:  Recommended Dietary Goals  Janielle is currently in the action stage of change. As such, her goal is to continue weight management plan. She has agreed to practicing portion control and making smarter food choices, such as increasing vegetables and decreasing simple carbohydrates.  Behavioral Intervention  We discussed the following Behavioral Modification Strategies today: increasing lean protein intake to established goals,  decreasing simple carbohydrates , increasing vegetables, increasing fiber rich foods, increasing water intake , and continue to work on maintaining a reduced calorie state, getting the recommended amount of protein, incorporating whole foods, making healthy choices, staying well hydrated and practicing mindfulness when eating..  Additional resources provided today: NA  Recommended Physical Activity Goals  Gregory has been advised to work up to 150 minutes of moderate intensity aerobic activity a week and strengthening exercises 2-3 times per week for cardiovascular health, weight loss maintenance and preservation of muscle mass.   She has agreed to Think about enjoyable ways to increase daily physical activity and overcoming barriers to exercise, Increase physical activity in their day and reduce sedentary time (increase  NEAT)., and continue to gradually increase the amount and intensity of exercise routine   Pharmacotherapy Patient has one  more Mounjaro  injection.  Plans to restart Qsymia  after the last Mounjaro  injection.    ASSOCIATED CONDITIONS ADDRESSED TODAY  Action/Plan  Prediabetes -     metFORMIN  HCl; Take 1 tablet (500 mg total) by mouth daily with breakfast.  Dispense: 30 tablet; Refill: 0  Audrena will continue to work on weight loss, exercise, and decreasing simple carbohydrates to help decrease the risk of diabetes.    Vitamin D  deficiency -     Vitamin D  (Ergocalciferol ); Take 1 capsule (50,000 Units total) by mouth every 7 (seven) days.  Dispense: 5 capsule; Refill: 0  Thyroid  nodule Continue to follow up with endo  Essential hypertension Stop hydrochlorothiazide .  Will monitor BP at home this week, will send me BP readings next week.  Will re add hydrochlorothiazide  back if BP increases.    Class 2 obesity due to excess calories without serious comorbidity with body mass index (BMI) of 38.0 to 38.9 in adult         Return in about 4 weeks (around 01/27/2024).Aaron Aas  She was informed of the importance of frequent follow up visits to maximize her success with intensive lifestyle modifications for her multiple health conditions.   ATTESTASTION STATEMENTS:  Reviewed by clinician on day of visit: allergies, medications, problem list, medical history, surgical history, family history, social history, and previous encounter notes.     Crist Dominion. Laycee Fitzsimmons FNP-C

## 2024-01-10 ENCOUNTER — Other Ambulatory Visit (HOSPITAL_COMMUNITY): Payer: Self-pay

## 2024-01-27 ENCOUNTER — Ambulatory Visit: Admitting: Nurse Practitioner

## 2024-01-27 ENCOUNTER — Other Ambulatory Visit: Payer: Self-pay

## 2024-01-27 ENCOUNTER — Other Ambulatory Visit (HOSPITAL_COMMUNITY): Payer: Self-pay

## 2024-01-27 ENCOUNTER — Encounter: Payer: Self-pay | Admitting: Nurse Practitioner

## 2024-01-27 VITALS — BP 121/82 | HR 67 | Temp 98.1°F | Ht 62.0 in | Wt 209.0 lb

## 2024-01-27 DIAGNOSIS — E66812 Obesity, class 2: Secondary | ICD-10-CM | POA: Diagnosis not present

## 2024-01-27 DIAGNOSIS — E6609 Other obesity due to excess calories: Secondary | ICD-10-CM

## 2024-01-27 DIAGNOSIS — I10 Essential (primary) hypertension: Secondary | ICD-10-CM

## 2024-01-27 DIAGNOSIS — Z6838 Body mass index (BMI) 38.0-38.9, adult: Secondary | ICD-10-CM | POA: Diagnosis not present

## 2024-01-27 MED ORDER — PHENTERMINE-TOPIRAMATE ER 7.5-46 MG PO CP24
1.0000 | ORAL_CAPSULE | Freq: Every day | ORAL | 0 refills | Status: DC
Start: 2024-01-27 — End: 2024-05-25
  Filled 2024-01-27: qty 30, 30d supply, fill #0

## 2024-01-27 NOTE — Progress Notes (Signed)
 Office: [phone]  /  Fax: [phone]  WEIGHT SUMMARY AND BIOMETRICS  Weight Lost Since Last Visit: 0lb  Weight Gained Since Last Visit: 1lb   Vitals Temp: 98.1 F (36.7 C) BP: 121/82 Pulse Rate: 67 SpO2: 100 %   Anthropometric Measurements Height: 5' 2 (1.575 m) Weight: 209 lb (94.8 kg) BMI (Calculated): 38.22 Weight at Last Visit: 208lb Weight Lost Since Last Visit: 0lb Weight Gained Since Last Visit: 1lb Starting Weight: 245lb Total Weight Loss (lbs): 36 lb (16.3 kg)   Body Composition  Body Fat %: 44.1 % Fat Mass (lbs): 92.4 lbs Muscle Mass (lbs): 111.2 lbs Total Body Water (lbs): 82 lbs Visceral Fat Rating : 11   Other Clinical Data Fasting: Yes Labs: No Today's Visit #: 35 Starting Date: 09/12/21     HPI  Chief Complaint: OBESITY  Nirel is here to discuss her progress with her obesity treatment plan. She is on the practicing portion control and making smarter food choices, such as increasing vegetables and decreasing simple carbohydrates and states she is following her eating plan approximately 50 % of the time. She states she is exercising 0 minutes 0 days per week.   Interval History:  Since last office visit she has gained 1 pound.  She notes since stopping Mounjaro , she has been struggling with eating more and snacking more-especially junk food. She is drinking water daily.     Pharmacotherapy for weight loss: She is not currently taking any medications for medical weight loss.    Previous pharmacotherapy for medical weight loss:   -Zepbound -stopped due to cost/coverage-did well -She took Topamax  and stopped due to side effects of fatigue.  -Mounjaro   -Qsymia -stopped when she started Mounjaro    Bariatric surgery:  Patient has not had bariatric surgery   Hypertension Hypertension stable.  Medication(s): restarted taking hydrochlorothiazide  12.5mg  since her last visit due to retaining fluid/LEE.  Denies side effects.  Denies  dizziness since restarting hydrochlorothiazide   Denies chest pain, palpitations and SOB.  BP Readings from Last 3 Encounters:  01/27/24 121/82  12/30/23 120/85  11/25/23 126/84   Lab Results  Component Value Date   CREATININE 0.96 10/28/2023   CREATININE 1.15 (H) 06/10/2023   CREATININE 1.07 (H) 04/01/2023       PHYSICAL EXAM:  Blood pressure 121/82, pulse 67, temperature 98.1 F (36.7 C), height 5' 2 (1.575 m), weight 209 lb (94.8 kg), last menstrual period 01/20/2024, SpO2 100%. Body mass index is 38.23 kg/m.  General: She is overweight, cooperative, alert, well developed, and in no acute distress. PSYCH: Has normal mood, affect and thought process.   Extremities: No edema.  Neurologic: No gross sensory or motor deficits. No tremors or fasciculations noted.    DIAGNOSTIC DATA REVIEWED:  BMET    Component Value Date/Time   NA 139 10/28/2023 1204   K 4.3 10/28/2023 1204   CL 106 10/28/2023 1204   CO2 22 10/28/2023 1204   GLUCOSE 92 10/28/2023 1204   GLUCOSE 93 05/14/2022 1119   BUN 17 10/28/2023 1204   CREATININE 0.96 10/28/2023 1204   CALCIUM 9.6 10/28/2023 1204   GFRNONAA >60 01/24/2021 1449   Lab Results  Component Value Date   HGBA1C 5.7 (H) 06/10/2023   HGBA1C 5.7 01/15/2016   Lab Results  Component Value Date   INSULIN  16.6 06/10/2023   INSULIN  29.5 (H) 09/12/2021   Lab Results  Component Value Date   TSH 1.180 08/04/2023   CBC    Component Value Date/Time   WBC  9.2 11/04/2022 1120   WBC 11.2 (H) 11/07/2021 1843   RBC 4.40 11/04/2022 1120   RBC 4.09 11/07/2021 1843   HGB 13.1 11/04/2022 1120   HCT 40.1 11/04/2022 1120   PLT 266 11/04/2022 1120   MCV 91 11/04/2022 1120   MCH 29.8 11/04/2022 1120   MCH 28.6 11/07/2021 1843   MCHC 32.7 11/04/2022 1120   MCHC 32.3 11/07/2021 1843   RDW 12.9 11/04/2022 1120   Iron Studies No results found for: IRON, TIBC, FERRITIN, IRONPCTSAT Lipid Panel     Component Value Date/Time   CHOL 164  06/10/2023 1045   TRIG 47 06/10/2023 1045   HDL 54 06/10/2023 1045   CHOLHDL 3 05/14/2022 1119   VLDL 8.0 05/14/2022 1119   LDLCALC 100 (H) 06/10/2023 1045   Hepatic Function Panel     Component Value Date/Time   PROT 7.1 10/28/2023 1204   ALBUMIN 4.2 10/28/2023 1204   AST 17 10/28/2023 1204   ALT 17 10/28/2023 1204   ALKPHOS 64 10/28/2023 1204   BILITOT 0.6 10/28/2023 1204      Component Value Date/Time   TSH 1.180 08/04/2023 1547   TSH 1.010 11/04/2022 1120   Nutritional Lab Results  Component Value Date   VD25OH 40.4 06/10/2023   VD25OH 34.8 03/04/2023   VD25OH 30.0 11/04/2022     ASSESSMENT AND PLAN  TREATMENT PLAN FOR OBESITY:  Recommended Dietary Goals  Francyne is currently in the action stage of change. As such, her goal is to continue weight management plan. She has agreed to practicing portion control and making smarter food choices, such as increasing vegetables and decreasing simple carbohydrates.  Increase protein intake, decrease carbs and increase water intake.    Behavioral Intervention  We discussed the following Behavioral Modification Strategies today: increasing lean protein intake to established goals, decreasing simple carbohydrates , increasing vegetables, increasing fiber rich foods, increasing water intake , and continue to work on maintaining a reduced calorie state, getting the recommended amount of protein, incorporating whole foods, making healthy choices, staying well hydrated and practicing mindfulness when eating..  Additional resources provided today: NA  Recommended Physical Activity Goals  Annalicia has been advised to work up to 150 minutes of moderate intensity aerobic activity a week and strengthening exercises 2-3 times per week for cardiovascular health, weight loss maintenance and preservation of muscle mass.   She has agreed to Think about enjoyable ways to increase daily physical activity and overcoming barriers to exercise,  Increase physical activity in their day and reduce sedentary time (increase NEAT)., and Work on scheduling and tracking physical activity.    Pharmacotherapy We discussed various medication options to help Zakkiyya with her weight loss efforts and we both agreed to start Qsymia  dose 2.  Side effects discussed.  Patient knows not to get pregnant while taking due to possibility of birth defects associated with topamax .  ASSOCIATED CONDITIONS ADDRESSED TODAY  Action/Plan  Essential hypertension Currently doing well with HCTZ.  Will continue to monitor.  Class 2 obesity due to excess calories without serious comorbidity with body mass index (BMI) of 38.0 to 38.9 in adult -     Start Phentermine -Topiramate  ER; Take 1 capsule by mouth daily.  Dispense: 30 capsule; Refill: 0         Return in about 4 weeks (around 02/24/2024).SABRA She was informed of the importance of frequent follow up visits to maximize her success with intensive lifestyle modifications for her multiple health conditions.   ATTESTASTION STATEMENTS:  Reviewed by clinician on day of visit: allergies, medications, problem list, medical history, surgical history, family history, social history, and previous encounter notes.     Corean SAUNDERS. Jerzie Bieri FNP-C

## 2024-01-29 ENCOUNTER — Other Ambulatory Visit (HOSPITAL_COMMUNITY): Payer: Self-pay

## 2024-02-03 ENCOUNTER — Encounter: Payer: Self-pay | Admitting: Nurse Practitioner

## 2024-02-03 ENCOUNTER — Other Ambulatory Visit: Payer: Self-pay | Admitting: Nurse Practitioner

## 2024-02-03 ENCOUNTER — Other Ambulatory Visit (HOSPITAL_COMMUNITY): Payer: Self-pay

## 2024-02-03 DIAGNOSIS — R7303 Prediabetes: Secondary | ICD-10-CM

## 2024-02-03 MED ORDER — METFORMIN HCL 500 MG PO TABS
500.0000 mg | ORAL_TABLET | Freq: Every day | ORAL | 0 refills | Status: DC
Start: 2024-02-03 — End: 2024-03-16
  Filled 2024-02-03: qty 30, 30d supply, fill #0

## 2024-02-03 NOTE — Telephone Encounter (Signed)
 Last Appt 01/27/24, Next Appt 03/01/24  Ok to refill Metformin 

## 2024-02-11 ENCOUNTER — Other Ambulatory Visit (HOSPITAL_COMMUNITY): Payer: Self-pay

## 2024-02-24 ENCOUNTER — Encounter: Payer: Self-pay | Admitting: Endocrinology

## 2024-02-24 DIAGNOSIS — E041 Nontoxic single thyroid nodule: Secondary | ICD-10-CM

## 2024-02-24 DIAGNOSIS — E042 Nontoxic multinodular goiter: Secondary | ICD-10-CM

## 2024-02-25 NOTE — Telephone Encounter (Signed)
 Lets do ultrasound to monitor thyroid  nodules.  I placed the order.

## 2024-02-27 ENCOUNTER — Ambulatory Visit
Admission: RE | Admit: 2024-02-27 | Discharge: 2024-02-27 | Disposition: A | Discharge status: Home / Self Care | Source: Ambulatory Visit | Attending: Endocrinology | Admitting: Endocrinology

## 2024-02-27 DIAGNOSIS — E042 Nontoxic multinodular goiter: Secondary | ICD-10-CM

## 2024-02-27 DIAGNOSIS — E041 Nontoxic single thyroid nodule: Secondary | ICD-10-CM

## 2024-03-01 ENCOUNTER — Encounter: Payer: Self-pay | Admitting: Nurse Practitioner

## 2024-03-01 ENCOUNTER — Ambulatory Visit: Admitting: Nurse Practitioner

## 2024-03-01 VITALS — BP 115/81 | HR 75 | Temp 98.4°F | Ht 62.0 in | Wt 213.0 lb

## 2024-03-01 DIAGNOSIS — Z6838 Body mass index (BMI) 38.0-38.9, adult: Secondary | ICD-10-CM

## 2024-03-01 DIAGNOSIS — R7303 Prediabetes: Secondary | ICD-10-CM | POA: Diagnosis not present

## 2024-03-01 DIAGNOSIS — R5383 Other fatigue: Secondary | ICD-10-CM

## 2024-03-01 DIAGNOSIS — E66812 Obesity, class 2: Secondary | ICD-10-CM

## 2024-03-01 DIAGNOSIS — I10 Essential (primary) hypertension: Secondary | ICD-10-CM

## 2024-03-01 DIAGNOSIS — E559 Vitamin D deficiency, unspecified: Secondary | ICD-10-CM | POA: Diagnosis not present

## 2024-03-01 DIAGNOSIS — E041 Nontoxic single thyroid nodule: Secondary | ICD-10-CM

## 2024-03-01 NOTE — Progress Notes (Signed)
 Office: [phone]  /  Fax: [phone]  WEIGHT SUMMARY AND BIOMETRICS  Weight Lost Since Last Visit: 0lb  Weight Gained Since Last Visit: 4lb   Vitals Temp: 98.4 F (36.9 C) BP: 115/81 Pulse Rate: 75 SpO2: 99 %   Anthropometric Measurements Height: 5' 2 (1.575 m) Weight: 213 lb (96.6 kg) BMI (Calculated): 38.95 Weight at Last Visit: 209lb Weight Lost Since Last Visit: 0lb Weight Gained Since Last Visit: 4lb Starting Weight: 245lb Total Weight Loss (lbs): 32 lb (14.5 kg)   Body Composition  Body Fat %: 44.2 % Fat Mass (lbs): 94.2 lbs Muscle Mass (lbs): 113 lbs Total Body Water (lbs): 82.4 lbs Visceral Fat Rating : 11   Other Clinical Data Fasting: Yes Labs: No Today's Visit #: 36 Starting Date: 09/12/21     HPI  Chief Complaint: OBESITY  Linda Mckinney is here to discuss her progress with her obesity treatment plan. She is on the practicing portion control and making smarter food choices, such as increasing vegetables and decreasing simple carbohydrates and states she is following her eating plan approximately 60 % of the time. She states she is exercising 0 minutes 0 days per week.   Interval History:  Since last office visit she has gained 4 lbs.  She recently returned from her aunt's funeral yesterday.  She is drinking a protein shake for breakfast 3 days per week (when she goes to work) and the days she doesn't go to work she eats 2 eggs, 1/2 bagel, fruit and 3 pieces of bacon.  For lunch, frozen meal and for dinner varies-protein with a carb.  She snacks on cookies.  She is struggling with cravings and finds she is overeating.  She had an episode at work the other day where she became hot and was sweating and tuned red in the face.  She felt like she was going to pass out but didn't. She notes fatigue.  Denies any further episodes.  She had a protein shake that am and then felt better when she ate a yogurt. She skipped her Qsymia  for 2 days after this  episode and restarted taking it today.  She is drinking water 64 oz.    LMP first week in August  Pharmacotherapy for weight loss: She is currently taking Qsymia  7.5-46mg   for medical weight loss.    Previous pharmacotherapy for medical weight loss:   -Zepbound -stopped due to cost/coverage-did well -She took Topamax  and stopped due to side effects of fatigue.  -Mounjaro   -Qsymia -stopped when she started Mounjaro    Bariatric surgery:  Patient has not had bariatric surgery   Prediabetes Last A1c was 5.7  Medication(s): Metformin  500mg . Polyphagia:Yes Lab Results  Component Value Date   HGBA1C 5.7 (H) 06/10/2023   HGBA1C 5.7 (H) 03/04/2023   HGBA1C 5.5 11/04/2022   HGBA1C 6.0 05/14/2022   HGBA1C 5.7 (H) 12/17/2021   Lab Results  Component Value Date   INSULIN  16.6 06/10/2023   INSULIN  11.9 03/04/2023   INSULIN  8.1 11/04/2022   INSULIN  12.2 12/17/2021   INSULIN  29.5 (H) 09/12/2021   Vit D deficiency  She is taking Vit D 50,000 IU weekly.  Denies side effects.  Denies nausea, vomiting or muscle weakness.    Lab Results  Component Value Date   VD25OH 40.4 06/10/2023   VD25OH 34.8 03/04/2023   VD25OH 30.0 11/04/2022     Hypertension Hypertension stable.  Medication(s): hydrochlorothiazide  12.5mg .   Denies chest pain, palpitations and SOB.  BP Readings from Last 3 Encounters:  03/01/24 115/81  01/27/24 121/82  12/30/23 120/85   Lab Results  Component Value Date   CREATININE 0.96 10/28/2023   CREATININE 1.15 (H) 06/10/2023   CREATININE 1.07 (H) 04/01/2023     Thyroid  nodule She reached out to Dr. Mercie on 02/24/24 with complaints of feeling like she has a knot in her throat.  She had a thyroid  US  on 02/27/24.  Waiting for results.    PHYSICAL EXAM:  Blood pressure 115/81, pulse 75, temperature 98.4 F (36.9 C), height 5' 2 (1.575 m), weight 213 lb (96.6 kg), last menstrual period 02/16/2024, SpO2 99%. Body mass index is 38.96 kg/m.  General: She is  overweight, cooperative, alert, well developed, and in no acute distress. PSYCH: Has normal mood, affect and thought process.   Extremities: No edema.  Neurologic: No gross sensory or motor deficits. No tremors or fasciculations noted.    DIAGNOSTIC DATA REVIEWED:  BMET    Component Value Date/Time   NA 139 10/28/2023 1204   K 4.3 10/28/2023 1204   CL 106 10/28/2023 1204   CO2 22 10/28/2023 1204   GLUCOSE 92 10/28/2023 1204   GLUCOSE 93 05/14/2022 1119   BUN 17 10/28/2023 1204   CREATININE 0.96 10/28/2023 1204   CALCIUM 9.6 10/28/2023 1204   GFRNONAA >60 01/24/2021 1449   Lab Results  Component Value Date   HGBA1C 5.7 (H) 06/10/2023   HGBA1C 5.7 01/15/2016   Lab Results  Component Value Date   INSULIN  16.6 06/10/2023   INSULIN  29.5 (H) 09/12/2021   Lab Results  Component Value Date   TSH 1.180 08/04/2023   CBC    Component Value Date/Time   WBC 9.2 11/04/2022 1120   WBC 11.2 (H) 11/07/2021 1843   RBC 4.40 11/04/2022 1120   RBC 4.09 11/07/2021 1843   HGB 13.1 11/04/2022 1120   HCT 40.1 11/04/2022 1120   PLT 266 11/04/2022 1120   MCV 91 11/04/2022 1120   MCH 29.8 11/04/2022 1120   MCH 28.6 11/07/2021 1843   MCHC 32.7 11/04/2022 1120   MCHC 32.3 11/07/2021 1843   RDW 12.9 11/04/2022 1120   Iron Studies No results found for: IRON, TIBC, FERRITIN, IRONPCTSAT Lipid Panel     Component Value Date/Time   CHOL 164 06/10/2023 1045   TRIG 47 06/10/2023 1045   HDL 54 06/10/2023 1045   CHOLHDL 3 05/14/2022 1119   VLDL 8.0 05/14/2022 1119   LDLCALC 100 (H) 06/10/2023 1045   Hepatic Function Panel     Component Value Date/Time   PROT 7.1 10/28/2023 1204   ALBUMIN 4.2 10/28/2023 1204   AST 17 10/28/2023 1204   ALT 17 10/28/2023 1204   ALKPHOS 64 10/28/2023 1204   BILITOT 0.6 10/28/2023 1204      Component Value Date/Time   TSH 1.180 08/04/2023 1547   TSH 1.010 11/04/2022 1120   Nutritional Lab Results  Component Value Date   VD25OH 40.4  06/10/2023   VD25OH 34.8 03/04/2023   VD25OH 30.0 11/04/2022     ASSESSMENT AND PLAN  TREATMENT PLAN FOR OBESITY:  Recommended Dietary Goals  Jon is currently in the action stage of change. As such, her goal is to continue weight management plan. She has agreed to track and will review macros at next visit.  Behavioral Intervention  We discussed the following Behavioral Modification Strategies today: increasing lean protein intake to established goals, decreasing simple carbohydrates , increasing vegetables, increasing fiber rich foods, increasing water intake , work on meal planning and  preparation, work on tracking and journaling calories using tracking application, reading food labels , keeping healthy foods at home, and continue to work on maintaining a reduced calorie state, getting the recommended amount of protein, incorporating whole foods, making healthy choices, staying well hydrated and practicing mindfulness when eating..  Additional resources provided today: NA  Recommended Physical Activity Goals  Zianne has been advised to work up to 150 minutes of moderate intensity aerobic activity a week and strengthening exercises 2-3 times per week for cardiovascular health, weight loss maintenance and preservation of muscle mass.   She has agreed to Think about enjoyable ways to increase daily physical activity and overcoming barriers to exercise, Increase physical activity in their day and reduce sedentary time (increase NEAT)., and Work on scheduling and tracking physical activity.    Pharmacotherapy We discussed various medication options to help Nitya with her weight loss efforts and we both agreed to stop Qsymia  until next visit.  ASSOCIATED CONDITIONS ADDRESSED TODAY  Action/Plan  Other fatigue -     CBC with Differential/Platelet -     Comprehensive metabolic panel with GFR -     TSH -     Vitamin B12 -     T4, free -     T3 -     Vitamin B12 -     POCT  Pregnancy, Urine  Prediabetes -     Hemoglobin A1c -     Insulin , random  Stop Metformin  until her next visit  Vitamin D  deficiency -     VITAMIN D  25 Hydroxy (Vit-D Deficiency, Fractures)  Essential hypertension Stop hydrochlorothiazide  until her next visit  Thyroid  nodule To continue to follow up with Dr. Mercie.  I've asked her to reach out to Dr. Mercie for US  results  Class 2 obesity due to excess calories without serious comorbidity with body mass index (BMI) of 38.0 to 38.9 in adult     I would like her to make an appt with her PCP for follow up.  I've asked her to stop all meds until she sees her PCP and me back for follow up in 2 weeks.  I would like her to track so I can see her calories and macros.  If any symptoms worsens to go to the ER.  I noted some edema in her right cheek and right neck.  I would also like her to see her PCP tomorrow for follow up and further evaluation.      Return in about 2 weeks (around 03/15/2024).SABRA She was informed of the importance of frequent follow up visits to maximize her success with intensive lifestyle modifications for her multiple health conditions.   ATTESTASTION STATEMENTS:  Reviewed by clinician on day of visit: allergies, medications, problem list, medical history, surgical history, family history, social history, and previous encounter notes.   Time spent on visit including pre-visit chart review and post-visit care and charting was 30 minutes.    Corean SAUNDERS. Kasem Mozer FNP-C

## 2024-03-02 LAB — HCG, SERUM, QUALITATIVE: hCG,Beta Subunit,Qual,Serum: NEGATIVE m[IU]/mL (ref <6)

## 2024-03-02 LAB — T3: T3, Total: 106 ng/dL (ref 71–180)

## 2024-03-02 LAB — CBC WITH DIFFERENTIAL/PLATELET
Basophils Absolute: 0 x10E3/uL (ref 0.0–0.2)
Basos: 0 %
EOS (ABSOLUTE): 0.2 x10E3/uL (ref 0.0–0.4)
Eos: 2 %
Hematocrit: 41.4 % (ref 34.0–46.6)
Hemoglobin: 12.9 g/dL (ref 11.1–15.9)
Immature Grans (Abs): 0 x10E3/uL (ref 0.0–0.1)
Immature Granulocytes: 0 %
Lymphocytes Absolute: 3 x10E3/uL (ref 0.7–3.1)
Lymphs: 34 %
MCH: 28.9 pg (ref 26.6–33.0)
MCHC: 31.2 g/dL — ABNORMAL LOW (ref 31.5–35.7)
MCV: 93 fL (ref 79–97)
Monocytes Absolute: 0.6 x10E3/uL (ref 0.1–0.9)
Monocytes: 7 %
Neutrophils Absolute: 5.2 x10E3/uL (ref 1.4–7.0)
Neutrophils: 57 %
Platelets: 252 x10E3/uL (ref 150–450)
RBC: 4.47 x10E6/uL (ref 3.77–5.28)
RDW: 12.7 % (ref 11.7–15.4)
WBC: 9 x10E3/uL (ref 3.4–10.8)

## 2024-03-02 LAB — COMPREHENSIVE METABOLIC PANEL WITH GFR
ALT: 20 IU/L (ref 0–32)
AST: 19 IU/L (ref 0–40)
Albumin: 3.9 g/dL (ref 3.9–4.9)
Alkaline Phosphatase: 68 IU/L (ref 44–121)
BUN/Creatinine Ratio: 19 (ref 9–23)
BUN: 17 mg/dL (ref 6–24)
Bilirubin Total: 0.4 mg/dL (ref 0.0–1.2)
CO2: 22 mmol/L (ref 20–29)
Calcium: 9.4 mg/dL (ref 8.7–10.2)
Chloride: 103 mmol/L (ref 96–106)
Creatinine, Ser: 0.88 mg/dL (ref 0.57–1.00)
Globulin, Total: 2.8 g/dL (ref 1.5–4.5)
Glucose: 95 mg/dL (ref 70–99)
Potassium: 4.3 mmol/L (ref 3.5–5.2)
Sodium: 137 mmol/L (ref 134–144)
Total Protein: 6.7 g/dL (ref 6.0–8.5)
eGFR: 85 mL/min/1.73 (ref >59)

## 2024-03-02 LAB — T4, FREE: Free T4: 1.02 ng/dL (ref 0.82–1.77)

## 2024-03-02 LAB — INSULIN, RANDOM: INSULIN: 21.4 u[IU]/mL (ref 2.6–24.9)

## 2024-03-02 LAB — VITAMIN D 25 HYDROXY (VIT D DEFICIENCY, FRACTURES): Vit D, 25-Hydroxy: 20.2 ng/mL — ABNORMAL LOW (ref 30.0–100.0)

## 2024-03-02 LAB — HEMOGLOBIN A1C
Est. average glucose Bld gHb Est-mCnc: 114 mg/dL
Hgb A1c MFr Bld: 5.6 % (ref 4.8–5.6)

## 2024-03-02 LAB — TSH: TSH: 1.48 u[IU]/mL (ref 0.450–4.500)

## 2024-03-02 LAB — VITAMIN B12: Vitamin B-12: 496 pg/mL (ref 232–1245)

## 2024-03-08 ENCOUNTER — Ambulatory Visit: Payer: Self-pay | Admitting: Endocrinology

## 2024-03-16 ENCOUNTER — Ambulatory Visit: Admitting: Nurse Practitioner

## 2024-03-16 ENCOUNTER — Other Ambulatory Visit: Payer: Self-pay | Admitting: Obstetrics and Gynecology

## 2024-03-16 ENCOUNTER — Encounter: Payer: Self-pay | Admitting: Nurse Practitioner

## 2024-03-16 ENCOUNTER — Other Ambulatory Visit (HOSPITAL_COMMUNITY): Payer: Self-pay

## 2024-03-16 VITALS — BP 126/84 | HR 69 | Temp 98.3°F | Ht 62.0 in | Wt 216.0 lb

## 2024-03-16 DIAGNOSIS — E6609 Other obesity due to excess calories: Secondary | ICD-10-CM

## 2024-03-16 DIAGNOSIS — E041 Nontoxic single thyroid nodule: Secondary | ICD-10-CM

## 2024-03-16 DIAGNOSIS — I10 Essential (primary) hypertension: Secondary | ICD-10-CM

## 2024-03-16 DIAGNOSIS — N6489 Other specified disorders of breast: Secondary | ICD-10-CM

## 2024-03-16 DIAGNOSIS — E66812 Obesity, class 2: Secondary | ICD-10-CM

## 2024-03-16 DIAGNOSIS — R7303 Prediabetes: Secondary | ICD-10-CM | POA: Diagnosis not present

## 2024-03-16 DIAGNOSIS — Z6839 Body mass index (BMI) 39.0-39.9, adult: Secondary | ICD-10-CM

## 2024-03-16 DIAGNOSIS — E559 Vitamin D deficiency, unspecified: Secondary | ICD-10-CM | POA: Diagnosis not present

## 2024-03-16 MED ORDER — VITAMIN D (ERGOCALCIFEROL) 1.25 MG (50000 UNIT) PO CAPS
50000.0000 [IU] | ORAL_CAPSULE | ORAL | 0 refills | Status: DC
  Filled 2024-03-16: qty 4, 28d supply, fill #0

## 2024-03-16 MED ORDER — METFORMIN HCL 500 MG PO TABS
500.0000 mg | ORAL_TABLET | Freq: Every day | ORAL | 0 refills | Status: DC
Start: 2024-03-16 — End: 2024-04-19
  Filled 2024-03-16: qty 30, 30d supply, fill #0

## 2024-03-16 NOTE — Progress Notes (Signed)
 Office: [phone]  /  Fax: [phone]  WEIGHT SUMMARY AND BIOMETRICS  Weight Lost Since Last Visit: 0lb  Weight Gained Since Last Visit: 3lb   Vitals Temp: 98.3 F (36.8 C) BP: 126/84 Pulse Rate: 69 SpO2: 100 %   Anthropometric Measurements Height: 5' 2 (1.575 m) Weight: 216 lb (98 kg) BMI (Calculated): 39.5 Weight at Last Visit: 213lb Weight Lost Since Last Visit: 0lb Weight Gained Since Last Visit: 3lb Starting Weight: 245lb Total Weight Loss (lbs): 29 lb (13.2 kg)   Body Composition  Body Fat %: 45.9 % Fat Mass (lbs): 99.2 lbs Muscle Mass (lbs): 110.8 lbs Total Body Water (lbs): 84.8 lbs Visceral Fat Rating : 12   Other Clinical Data Fasting: Yes Labs: No Today's Visit #: 37 Starting Date: 09/12/21     HPI  Chief Complaint: OBESITY  Linda Mckinney is here to discuss her progress with her obesity treatment plan. She is on the practicing portion control and making smarter food choices, such as increasing vegetables and decreasing simple carbohydrates and states she is following her eating plan approximately 50 % of the time. She states she is exercising 0 minutes 0 days per week.   Interval History:  Since last office visit she has gained 3 pounds.  She is averaging around [phone] calories and 50-60 grams of protein.  She tends to skip a meal when she is off of work. She is drinking water, sparkling water and sodas.  She is not currently exercising.   She notes that she is overall feeling better.  She is under a lot of stress and feels that her fatigue is due to her stress.  She hasn't had any further episodes of feeling like she is going to pass out.    Pharmacotherapy for weight loss: She is not currently taking Qsymia .  I stopped it after her last visit.  She is struggling with polyphagia and cravings since stopping it.      Previous pharmacotherapy for medical weight loss:   -Zepbound -stopped due to cost/coverage-did well -She took Topamax  and  stopped due to side effects of fatigue.  -Mounjaro   -Qsymia -stopped when she started Mounjaro    Bariatric surgery:  Patient has not had bariatric surgery   Thyroid  nodule Last US  was 02/27/24 Last TSH, T3 and free T4 was 03/01/24  Copied from results on 02/27/24 IMPRESSION: 1. No interval change in the size or appearance of the previously biopsied nodule in the right mid gland. 2. No new nodules or suspicious features.  Vit D deficiency  She is taking Vit D 50,000 IU weekly.  Denies side effects.  Denies nausea, vomiting or muscle weakness.    Lab Results  Component Value Date   VD25OH 20.2 (L) 03/01/2024   VD25OH 40.4 06/10/2023   VD25OH 34.8 03/04/2023     Hypertension Hypertension stable.  Medication(s): hydrochlorothiazide  12.5mg -restarted taking 2 days ago.   Denies chest pain, palpitations and SOB.  BP Readings from Last 3 Encounters:  03/16/24 126/84  03/01/24 115/81  01/27/24 121/82   Lab Results  Component Value Date   CREATININE 0.88 03/01/2024   CREATININE 0.96 10/28/2023   CREATININE 1.15 (H) 06/10/2023     Prediabetes Last A1c was 5.6-improved  Medication(s): Not currently taking Metformin .  Plans to restart this week.  Polyphagia:Yes Lab Results  Component Value Date   HGBA1C 5.6 03/01/2024   HGBA1C 5.7 (H) 06/10/2023   HGBA1C 5.7 (H) 03/04/2023   HGBA1C 5.5 11/04/2022   HGBA1C 6.0 05/14/2022   Lab  Results  Component Value Date   INSULIN  21.4 03/01/2024   INSULIN  16.6 06/10/2023   INSULIN  11.9 03/04/2023   INSULIN  8.1 11/04/2022   INSULIN  12.2 12/17/2021     PHYSICAL EXAM:  Blood pressure 126/84, pulse 69, temperature 98.3 F (36.8 C), height 5' 2 (1.575 m), weight 216 lb (98 kg), last menstrual period 02/16/2024, SpO2 100%. Body mass index is 39.51 kg/m.  General: She is overweight, cooperative, alert, well developed, and in no acute distress. PSYCH: Has normal mood, affect and thought process.   Extremities: No edema.   Neurologic: No gross sensory or motor deficits. No tremors or fasciculations noted.    DIAGNOSTIC DATA REVIEWED:  BMET    Component Value Date/Time   NA 137 03/01/2024 1038   K 4.3 03/01/2024 1038   CL 103 03/01/2024 1038   CO2 22 03/01/2024 1038   GLUCOSE 95 03/01/2024 1038   GLUCOSE 93 05/14/2022 1119   BUN 17 03/01/2024 1038   CREATININE 0.88 03/01/2024 1038   CALCIUM 9.4 03/01/2024 1038   GFRNONAA >60 01/24/2021 1449   Lab Results  Component Value Date   HGBA1C 5.6 03/01/2024   HGBA1C 5.7 01/15/2016   Lab Results  Component Value Date   INSULIN  21.4 03/01/2024   INSULIN  29.5 (H) 09/12/2021   Lab Results  Component Value Date   TSH 1.480 03/01/2024   CBC    Component Value Date/Time   WBC 9.0 03/01/2024 1038   WBC 11.2 (H) 11/07/2021 1843   RBC 4.47 03/01/2024 1038   RBC 4.09 11/07/2021 1843   HGB 12.9 03/01/2024 1038   HCT 41.4 03/01/2024 1038   PLT 252 03/01/2024 1038   MCV 93 03/01/2024 1038   MCH 28.9 03/01/2024 1038   MCH 28.6 11/07/2021 1843   MCHC 31.2 (L) 03/01/2024 1038   MCHC 32.3 11/07/2021 1843   RDW 12.7 03/01/2024 1038   Iron Studies No results found for: IRON, TIBC, FERRITIN, IRONPCTSAT Lipid Panel     Component Value Date/Time   CHOL 164 06/10/2023 1045   TRIG 47 06/10/2023 1045   HDL 54 06/10/2023 1045   CHOLHDL 3 05/14/2022 1119   VLDL 8.0 05/14/2022 1119   LDLCALC 100 (H) 06/10/2023 1045   Hepatic Function Panel     Component Value Date/Time   PROT 6.7 03/01/2024 1038   ALBUMIN 3.9 03/01/2024 1038   AST 19 03/01/2024 1038   ALT 20 03/01/2024 1038   ALKPHOS 68 03/01/2024 1038   BILITOT 0.4 03/01/2024 1038      Component Value Date/Time   TSH 1.480 03/01/2024 1038   Nutritional Lab Results  Component Value Date   VD25OH 20.2 (L) 03/01/2024   VD25OH 40.4 06/10/2023   VD25OH 34.8 03/04/2023     ASSESSMENT AND PLAN  TREATMENT PLAN FOR OBESITY:  Recommended Dietary Goals  Jassmine is currently in the  action stage of change. As such, her goal is to continue weight management plan. She has agreed to track and will review macros at next visit.  Behavioral Intervention  We discussed the following Behavioral Modification Strategies today: increasing lean protein intake to established goals, decreasing simple carbohydrates , increasing vegetables, increasing fiber rich foods, avoiding skipping meals, increasing water intake , work on meal planning and preparation, work on tracking and journaling calories using tracking application, reading food labels , keeping healthy foods at home, continue to work on maintaining a reduced calorie state, getting the recommended amount of protein, incorporating whole foods, making healthy choices, staying well  hydrated and practicing mindfulness when eating., and increase protein intake, fibrous foods (25 grams per day for women, 30 grams for men) and water to improve satiety and decrease hunger signals. .  Additional resources provided today: NA  Recommended Physical Activity Goals  Zarrah has been advised to work up to 150 minutes of moderate intensity aerobic activity a week and strengthening exercises 2-3 times per week for cardiovascular health, weight loss maintenance and preservation of muscle mass.   She has agreed to Think about enjoyable ways to increase daily physical activity and overcoming barriers to exercise, Increase physical activity in their day and reduce sedentary time (increase NEAT)., and Work on scheduling and tracking physical activity.    Pharmacotherapy We discussed various medication options to help Tamerra with her weight loss efforts and we both agreed to rstart Qsymia  dose 2.  Side effects discussed.  ASSOCIATED CONDITIONS ADDRESSED TODAY  Action/Plan  Thyroid  nodule Managed by endo.  Keep follow up appt.    Vitamin D  deficiency -     Restart Vitamin D  (Ergocalciferol ); Take 1 capsule (50,000 Units total) by mouth every 7  (seven) days.  Dispense: 5 capsule; Refill: 0. Side effects discussed.   Essential hypertension Currently doing well with hydrochlorothiazide .  Managed by PCP.    Prediabetes -     metFORMIN  HCl; Take 1 tablet (500 mg total) by mouth daily with breakfast.  Dispense: 30 tablet; Refill: 0. Side effects discussed  Class 2 obesity due to excess calories without serious comorbidity with body mass index (BMI) of 39.0 to 39.9 in adult     Labs reviewed in chart with patient from 03/01/24  To start re adding medication one per week. If any side effects to let me know.     Return in about 4 weeks (around 04/13/2024).Linda Mckinney She was informed of the importance of frequent follow up visits to maximize her success with intensive lifestyle modifications for her multiple health conditions.   ATTESTASTION STATEMENTS:  Reviewed by clinician on day of visit: allergies, medications, problem list, medical history, surgical history, family history, social history, and previous encounter notes.     Corean SAUNDERS. Ovie Eastep FNP-C

## 2024-04-16 ENCOUNTER — Other Ambulatory Visit: Payer: Self-pay | Admitting: Obstetrics and Gynecology

## 2024-04-16 DIAGNOSIS — N6489 Other specified disorders of breast: Secondary | ICD-10-CM

## 2024-04-19 ENCOUNTER — Ambulatory Visit
Admission: RE | Admit: 2024-04-19 | Discharge: 2024-04-19 | Disposition: A | Discharge status: Home / Self Care | Source: Ambulatory Visit | Attending: Obstetrics and Gynecology | Admitting: Obstetrics and Gynecology

## 2024-04-19 ENCOUNTER — Other Ambulatory Visit: Payer: Self-pay

## 2024-04-19 ENCOUNTER — Other Ambulatory Visit (HOSPITAL_COMMUNITY): Payer: Self-pay

## 2024-04-19 ENCOUNTER — Ambulatory Visit: Admitting: Nurse Practitioner

## 2024-04-19 ENCOUNTER — Encounter: Payer: Self-pay | Admitting: Nurse Practitioner

## 2024-04-19 VITALS — BP 135/88 | HR 71 | Temp 97.9°F | Ht 62.0 in | Wt 210.0 lb

## 2024-04-19 DIAGNOSIS — E559 Vitamin D deficiency, unspecified: Secondary | ICD-10-CM

## 2024-04-19 DIAGNOSIS — E66812 Obesity, class 2: Secondary | ICD-10-CM | POA: Diagnosis not present

## 2024-04-19 DIAGNOSIS — Z6838 Body mass index (BMI) 38.0-38.9, adult: Secondary | ICD-10-CM | POA: Diagnosis not present

## 2024-04-19 DIAGNOSIS — R7303 Prediabetes: Secondary | ICD-10-CM

## 2024-04-19 DIAGNOSIS — N6489 Other specified disorders of breast: Secondary | ICD-10-CM

## 2024-04-19 MED ORDER — VITAMIN D (ERGOCALCIFEROL) 1.25 MG (50000 UNIT) PO CAPS
50000.0000 [IU] | ORAL_CAPSULE | ORAL | 0 refills | Status: DC
  Filled 2024-04-19: qty 4, 28d supply, fill #0

## 2024-04-19 MED ORDER — METFORMIN HCL 500 MG PO TABS
500.0000 mg | ORAL_TABLET | Freq: Every day | ORAL | 0 refills | Status: DC
  Filled 2024-04-19: qty 30, 30d supply, fill #0

## 2024-04-19 NOTE — Progress Notes (Signed)
 Office: [phone]  /  Fax: [phone]  WEIGHT SUMMARY AND BIOMETRICS  Weight Lost Since Last Visit: 6lb  Weight Gained Since Last Visit: 0lb   Vitals Temp: 97.9 F (36.6 C) BP: 135/88 Pulse Rate: 71 SpO2: 100 %   Anthropometric Measurements Height: 5' 2 (1.575 m) Weight: 210 lb (95.3 kg) BMI (Calculated): 38.4 Weight at Last Visit: 216lb Weight Lost Since Last Visit: 6lb Weight Gained Since Last Visit: 0lb Starting Weight: 245ln Total Weight Loss (lbs): 35 lb (15.9 kg)   Body Composition  Body Fat %: 44.6 % Fat Mass (lbs): 94 lbs Muscle Mass (lbs): 110.8 lbs Total Body Water (lbs): 85.4 lbs Visceral Fat Rating : 11   Other Clinical Data Fasting: No Labs: No Today's Visit #: 38 Starting Date: 09/12/21     HPI  Chief Complaint: OBESITY  Jericka is here to discuss her progress with her obesity treatment plan. She is on the practicing portion control and making smarter food choices, such as increasing vegetables and decreasing simple carbohydrates and states she is following her eating plan approximately 60 % of the time. She states she is exercising 0 minutes 0 days per week.   Interval History:  Since last office visit she has lost 6 pounds. She has been trying to make healthier choices and watching her portion sizes. She has been working extra hours, night shift. She is drinking water, sparkling water and sodas. She is not exercising due to her work schedule and working overtime.    Pharmacotherapy for weight loss: She is currently taking Mounjaro  2.5mg  given to her by her sister (has one injection left).  Denies side effects.  She is not taking Qsymia  but plans to start taking it once she stopped her Mounjaro .  Previous pharmacotherapy for medical weight loss:   -Zepbound -stopped due to cost/coverage-did well -She took Topamax  and stopped due to side effects of fatigue.  -Mounjaro   -Qsymia -stopped when she started Mounjaro    Bariatric surgery:   Patient has not had bariatric surgery   Vit D deficiency  She is taking Vit D 50,000 IU weekly.  Denies side effects.  Denies nausea, vomiting or muscle weakness.    Lab Results  Component Value Date   VD25OH 20.2 (L) 03/01/2024   VD25OH 40.4 06/10/2023   VD25OH 34.8 03/04/2023     Prediabetes Last A1c was 5.6  Medication(s): Metformin  500mg  with breakfast. Denies side effects.   Polyphagia:Yes Lab Results  Component Value Date   HGBA1C 5.6 03/01/2024   HGBA1C 5.7 (H) 06/10/2023   HGBA1C 5.7 (H) 03/04/2023   HGBA1C 5.5 11/04/2022   HGBA1C 6.0 05/14/2022   Lab Results  Component Value Date   INSULIN  21.4 03/01/2024   INSULIN  16.6 06/10/2023   INSULIN  11.9 03/04/2023   INSULIN  8.1 11/04/2022   INSULIN  12.2 12/17/2021     PHYSICAL EXAM:  Blood pressure 135/88, pulse 71, temperature 97.9 F (36.6 C), height 5' 2 (1.575 m), weight 210 lb (95.3 kg), last menstrual period 04/12/2024, SpO2 100%. Body mass index is 38.41 kg/m.  General: She is overweight, cooperative, alert, well developed, and in no acute distress. PSYCH: Has normal mood, affect and thought process.   Extremities: No edema.  Neurologic: No gross sensory or motor deficits. No tremors or fasciculations noted.    DIAGNOSTIC DATA REVIEWED:  BMET    Component Value Date/Time   NA 137 03/01/2024 1038   K 4.3 03/01/2024 1038   CL 103 03/01/2024 1038   CO2 22 03/01/2024 1038  GLUCOSE 95 03/01/2024 1038   GLUCOSE 93 05/14/2022 1119   BUN 17 03/01/2024 1038   CREATININE 0.88 03/01/2024 1038   CALCIUM 9.4 03/01/2024 1038   GFRNONAA >60 01/24/2021 1449   Lab Results  Component Value Date   HGBA1C 5.6 03/01/2024   HGBA1C 5.7 01/15/2016   Lab Results  Component Value Date   INSULIN  21.4 03/01/2024   INSULIN  29.5 (H) 09/12/2021   Lab Results  Component Value Date   TSH 1.480 03/01/2024   CBC    Component Value Date/Time   WBC 9.0 03/01/2024 1038   WBC 11.2 (H) 11/07/2021 1843   RBC 4.47  03/01/2024 1038   RBC 4.09 11/07/2021 1843   HGB 12.9 03/01/2024 1038   HCT 41.4 03/01/2024 1038   PLT 252 03/01/2024 1038   MCV 93 03/01/2024 1038   MCH 28.9 03/01/2024 1038   MCH 28.6 11/07/2021 1843   MCHC 31.2 (L) 03/01/2024 1038   MCHC 32.3 11/07/2021 1843   RDW 12.7 03/01/2024 1038   Iron Studies No results found for: IRON, TIBC, FERRITIN, IRONPCTSAT Lipid Panel     Component Value Date/Time   CHOL 164 06/10/2023 1045   TRIG 47 06/10/2023 1045   HDL 54 06/10/2023 1045   CHOLHDL 3 05/14/2022 1119   VLDL 8.0 05/14/2022 1119   LDLCALC 100 (H) 06/10/2023 1045   Hepatic Function Panel     Component Value Date/Time   PROT 6.7 03/01/2024 1038   ALBUMIN 3.9 03/01/2024 1038   AST 19 03/01/2024 1038   ALT 20 03/01/2024 1038   ALKPHOS 68 03/01/2024 1038   BILITOT 0.4 03/01/2024 1038      Component Value Date/Time   TSH 1.480 03/01/2024 1038   Nutritional Lab Results  Component Value Date   VD25OH 20.2 (L) 03/01/2024   VD25OH 40.4 06/10/2023   VD25OH 34.8 03/04/2023     ASSESSMENT AND PLAN  TREATMENT PLAN FOR OBESITY:  Recommended Dietary Goals  Ruthella is currently in the action stage of change. As such, her goal is to continue weight management plan. She has agreed to practicing portion control and making smarter food choices, such as increasing vegetables and decreasing simple carbohydrates.  Behavioral Intervention  We discussed the following Behavioral Modification Strategies today: increasing lean protein intake to established goals, decreasing simple carbohydrates , increasing vegetables, increasing fiber rich foods, increasing water intake , work on meal planning and preparation, reading food labels , keeping healthy foods at home, continue to work on maintaining a reduced calorie state, getting the recommended amount of protein, incorporating whole foods, making healthy choices, staying well hydrated and practicing mindfulness when eating., and  increase protein intake, fibrous foods (25 grams per day for women, 30 grams for men) and water to improve satiety and decrease hunger signals. .  Additional resources provided today: NA  Recommended Physical Activity Goals  Vanshika has been advised to work up to 150 minutes of moderate intensity aerobic activity a week and strengthening exercises 2-3 times per week for cardiovascular health, weight loss maintenance and preservation of muscle mass.   She has agreed to Think about enjoyable ways to increase daily physical activity and overcoming barriers to exercise, Increase physical activity in their day and reduce sedentary time (increase NEAT)., Work on scheduling and tracking physical activity. , and Combine aerobic and strengthening exercises for efficiency and improved cardiometabolic health.   Pharmacotherapy We discussed various medication options to help Lilee with her weight loss efforts and we both agreed to start  Qsymia  once she stops Mounjaro .  Side effects discussed.  Patient knows not to get pregnant while taking due to possible tetragenic birth defects associated with Topamax .  ASSOCIATED CONDITIONS ADDRESSED TODAY  Action/Plan  Vitamin D  deficiency -     Continue Vitamin D  (Ergocalciferol ); Take 1 capsule (50,000 Units total) by mouth every 7 (seven) days.  Dispense: 5 capsule; Refill: 0.  Side effects discussed.  Prediabetes -     Continue metFORMIN  HCl; Take 1 tablet (500 mg total) by mouth daily with breakfast.  Dispense: 30 tablet; Refill: 0.  Side effects discussed.  Class 2 obesity due to excess calories without serious comorbidity with body mass index (BMI) of 38.0 to 38.9 in adult  Will recheck a CMP after starting Qsymia .  She is also currently taking metformin .  Return in about 4 weeks (around 05/17/2024).SABRA She was informed of the importance of frequent follow up visits to maximize her success with intensive lifestyle modifications for her multiple health  conditions.   ATTESTASTION STATEMENTS:  Reviewed by clinician on day of visit: allergies, medications, problem list, medical history, surgical history, family history, social history, and previous encounter notes.     Corean SAUNDERS. Carlton Buskey FNP-C

## 2024-04-28 ENCOUNTER — Other Ambulatory Visit: Payer: Self-pay | Admitting: Obstetrics and Gynecology

## 2024-04-28 DIAGNOSIS — N6489 Other specified disorders of breast: Secondary | ICD-10-CM

## 2024-05-04 ENCOUNTER — Other Ambulatory Visit: Payer: Self-pay

## 2024-05-12 ENCOUNTER — Other Ambulatory Visit (HOSPITAL_COMMUNITY): Payer: Self-pay

## 2024-05-25 ENCOUNTER — Other Ambulatory Visit (HOSPITAL_COMMUNITY): Payer: Self-pay

## 2024-05-25 ENCOUNTER — Other Ambulatory Visit: Payer: Self-pay

## 2024-05-25 ENCOUNTER — Ambulatory Visit: Admitting: Nurse Practitioner

## 2024-05-25 ENCOUNTER — Encounter: Payer: Self-pay | Admitting: Nurse Practitioner

## 2024-05-25 VITALS — BP 133/84 | HR 83 | Temp 98.3°F | Ht 62.0 in | Wt 219.0 lb

## 2024-05-25 DIAGNOSIS — R55 Syncope and collapse: Secondary | ICD-10-CM

## 2024-05-25 DIAGNOSIS — Z79899 Other long term (current) drug therapy: Secondary | ICD-10-CM

## 2024-05-25 DIAGNOSIS — E66813 Obesity, class 3: Secondary | ICD-10-CM | POA: Diagnosis not present

## 2024-05-25 DIAGNOSIS — R7303 Prediabetes: Secondary | ICD-10-CM

## 2024-05-25 DIAGNOSIS — Z6841 Body Mass Index (BMI) 40.0 and over, adult: Secondary | ICD-10-CM

## 2024-05-25 MED ORDER — METFORMIN HCL 500 MG PO TABS
500.0000 mg | ORAL_TABLET | Freq: Every day | ORAL | 0 refills | Status: DC
  Filled 2024-05-25: qty 30, 30d supply, fill #0

## 2024-05-25 MED ORDER — TOPIRAMATE 50 MG PO TABS
50.0000 mg | ORAL_TABLET | Freq: Every day | ORAL | 0 refills | Status: DC
  Filled 2024-05-25: qty 30, 30d supply, fill #0

## 2024-05-25 NOTE — Progress Notes (Signed)
 Office: [phone]  /  Fax: [phone]  WEIGHT SUMMARY AND BIOMETRICS  Weight Lost Since Last Visit: 0lb  Weight Gained Since Last Visit: 9lb   Vitals Temp: 98.3 F (36.8 C) BP: 133/84 Pulse Rate: 83 SpO2: 99 %   Anthropometric Measurements Height: 5' 2 (1.575 m) Weight: 219 lb (99.3 kg) BMI (Calculated): 40.05 Weight at Last Visit: 210lb Weight Lost Since Last Visit: 0lb Weight Gained Since Last Visit: 9lb Starting Weight: 245lb Total Weight Loss (lbs): 26 lb (11.8 kg)   Body Composition  Body Fat %: 47.9 % Fat Mass (lbs): 105 lbs Muscle Mass (lbs): 108.6 lbs Total Body Water (lbs): 90.6 lbs Visceral Fat Rating : 13   Other Clinical Data Fasting: No Labs: No Today's Visit #: 72 Starting Date: 09/12/21     HPI  Chief Complaint: OBESITY  Linda Mckinney is here to discuss her progress with her obesity treatment plan. She is on the practicing portion control and making smarter food choices, such as increasing vegetables and decreasing simple carbohydrates and states she is following her eating plan approximately 50 % of the time. She states she is exercising 0 minutes 0 days per week.   Interval History:  Since last office visit she has gained 9 pounds.  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  She is stress eating since starting back to school and said this has contributed to her weight gain.   She is working 5 days per week 7a-7p 3 days and 8p-8a 2 days per week.  Due to school and work she hasn't been able to focus on herself.  She has been eating out of convenience and what is easy.  She had another episode at work where she felt like she was going to pass out.  She got hot and sweaty.  She ate some cheez its and felt better after eating. She did not pass out.  She was working night shift and had eaten once that day.  She has been drinking energy drinks to stay awake. She is also drinking water, some sodas and  electrolytes.  She is not exercising.  Reports palpitations. Denies chest pain or SHOB.    Pharmacotherapy for weight loss:   She is currently taking Qsymia  7.5-46mg  x 3 weeks.  She stopped taking Mounjaro  since her last visit.     Previous pharmacotherapy for medical weight loss:   -Zepbound -stopped due to cost/coverage-did well -She took Topamax  and stopped due to side effects of fatigue.  -Mounjaro   -Qsymia -stopped when she started Mounjaro    Bariatric surgery:  Patient has not had bariatric surgery   Prediabetes Last A1c was 5.6  Medication(s): Metformin  500 mg once daily breakfast Polyphagia:Yes Lab Results  Component Value Date   HGBA1C 5.6 03/01/2024   HGBA1C 5.7 (H) 06/10/2023   HGBA1C 5.7 (H) 03/04/2023   HGBA1C 5.5 11/04/2022   HGBA1C 6.0 05/14/2022   Lab Results  Component Value Date   INSULIN  21.4 03/01/2024   INSULIN  16.6 06/10/2023   INSULIN  11.9 03/04/2023   INSULIN  8.1 11/04/2022   INSULIN  12.2 12/17/2021      PHYSICAL EXAM:  Blood pressure 133/84, pulse 83, temperature 98.3 F (36.8 C), height 5' 2 (1.575 m), weight 219 lb (99.3 kg), last menstrual period 05/11/2024, SpO2 99%. Body mass index is 40.06 kg/m.  General: She is overweight, cooperative, alert, well developed, and in no acute distress. PSYCH: Has normal mood, affect and thought process.  Extremities: No edema.  Neurologic: No gross sensory or motor deficits. No tremors or fasciculations noted.    DIAGNOSTIC DATA REVIEWED:  BMET    Component Value Date/Time   NA 137 03/01/2024 1038   K 4.3 03/01/2024 1038   CL 103 03/01/2024 1038   CO2 22 03/01/2024 1038   GLUCOSE 95 03/01/2024 1038   GLUCOSE 93 05/14/2022 1119   BUN 17 03/01/2024 1038   CREATININE 0.88 03/01/2024 1038   CALCIUM 9.4 03/01/2024 1038   GFRNONAA >60 01/24/2021 1449   Lab Results  Component Value Date   HGBA1C 5.6 03/01/2024   HGBA1C 5.7 01/15/2016   Lab Results  Component Value Date   INSULIN  21.4  03/01/2024   INSULIN  29.5 (H) 09/12/2021   Lab Results  Component Value Date   TSH 1.480 03/01/2024   CBC    Component Value Date/Time   WBC 9.0 03/01/2024 1038   WBC 11.2 (H) 11/07/2021 1843   RBC 4.47 03/01/2024 1038   RBC 4.09 11/07/2021 1843   HGB 12.9 03/01/2024 1038   HCT 41.4 03/01/2024 1038   PLT 252 03/01/2024 1038   MCV 93 03/01/2024 1038   MCH 28.9 03/01/2024 1038   MCH 28.6 11/07/2021 1843   MCHC 31.2 (L) 03/01/2024 1038   MCHC 32.3 11/07/2021 1843   RDW 12.7 03/01/2024 1038   Iron Studies No results found for: IRON, TIBC, FERRITIN, IRONPCTSAT Lipid Panel     Component Value Date/Time   CHOL 164 06/10/2023 1045   TRIG 47 06/10/2023 1045   HDL 54 06/10/2023 1045   CHOLHDL 3 05/14/2022 1119   VLDL 8.0 05/14/2022 1119   LDLCALC 100 (H) 06/10/2023 1045   Hepatic Function Panel     Component Value Date/Time   PROT 6.7 03/01/2024 1038   ALBUMIN 3.9 03/01/2024 1038   AST 19 03/01/2024 1038   ALT 20 03/01/2024 1038   ALKPHOS 68 03/01/2024 1038   BILITOT 0.4 03/01/2024 1038      Component Value Date/Time   TSH 1.480 03/01/2024 1038   Nutritional Lab Results  Component Value Date   VD25OH 20.2 (L) 03/01/2024   VD25OH 40.4 06/10/2023   VD25OH 34.8 03/04/2023     ASSESSMENT AND PLAN  TREATMENT PLAN FOR OBESITY:  Recommended Dietary Goals  Linda Mckinney is currently in the action stage of change. As such, her goal is to continue weight management plan. She has agreed to keeping a food journal and adhering to recommended goals of 1400 calories and 90+ grams of protein.  Behavioral Intervention  We discussed the following Behavioral Modification Strategies today: increasing lean protein intake to established goals, decreasing simple carbohydrates , increasing vegetables, increasing fiber rich foods, avoiding skipping meals, increasing water intake , work on meal planning and preparation, continue to work on maintaining a reduced calorie state,  getting the recommended amount of protein, incorporating whole foods, making healthy choices, staying well hydrated and practicing mindfulness when eating., and increase protein intake, fibrous foods (25 grams per day for women, 30 grams for men) and water to improve satiety and decrease hunger signals. .  Additional resources provided today: NA  Recommended Physical Activity Goals  Linda Mckinney has been advised to work up to 150 minutes of moderate intensity aerobic activity a week and strengthening exercises 2-3 times per week for cardiovascular health, weight loss maintenance and preservation of muscle mass.   She has agreed to Think about enjoyable ways to increase daily physical activity and overcoming barriers to exercise, Increase physical activity  in their day and reduce sedentary time (increase NEAT)., Work on scheduling and tracking physical activity. , and Combine aerobic and strengthening exercises for efficiency and improved cardiometabolic health.   Pharmacotherapy We discussed various medication options to help Linda Mckinney with her weight loss efforts and we both agreed to stop Qsymia  and start Topamax  50mg  for food impulse control and cravings.  Would like her to see cardiology prior to restarting Qsymia .   ASSOCIATED CONDITIONS ADDRESSED TODAY  Action/Plan  Prediabetes -     metFORMIN  HCl; Take 1 tablet (500 mg total) by mouth daily with breakfast.  Dispense: 30 tablet; Refill: 0.  Side effects discussed.  Should skip if skipping meals.   Syncope, unspecified syncope type -     Ambulatory referral to Cardiology  I think most of her problems are due to stress, skipping meals and drinking a significant amount of caffeine.  To stop sodas and energy drinks.  I would also like her to stop her electrolyte drinks.  I did refer her to cardiology due to complaints of syncope and palpitations.  Would like clearance prior to starting Qsymia  back.     Medication management -     Comprehensive  metabolic panel with GFR  Class 3 severe obesity due to excess calories with body mass index (BMI) of 40.0 to 44.9 in adult, unspecified whether serious comorbidity present [facility]) -     Ambulatory referral to Cardiology -     Topiramate ; Take 1 tablet (50 mg total) by mouth daily.  Dispense: 30 tablet; Refill: 0       She is seeing her PCP next week for follow up and labs.    Return in about 4 weeks (around 06/22/2024).SABRA She was informed of the importance of frequent follow up visits to maximize her success with intensive lifestyle modifications for her multiple health conditions.   ATTESTASTION STATEMENTS:  Reviewed by clinician on day of visit: allergies, medications, problem list, medical history, surgical history, family history, social history, and previous encounter notes.     Corean SAUNDERS. Linda Hassan FNP-C

## 2024-05-26 LAB — COMPREHENSIVE METABOLIC PANEL WITH GFR
ALT: 17 IU/L (ref 0–32)
AST: 14 IU/L (ref 0–40)
Albumin: 4.1 g/dL (ref 3.9–4.9)
Alkaline Phosphatase: 65 IU/L (ref 41–116)
BUN/Creatinine Ratio: 19 (ref 9–23)
BUN: 15 mg/dL (ref 6–24)
Bilirubin Total: 0.3 mg/dL (ref 0.0–1.2)
CO2: 24 mmol/L (ref 20–29)
Calcium: 9.1 mg/dL (ref 8.7–10.2)
Chloride: 102 mmol/L (ref 96–106)
Creatinine, Ser: 0.78 mg/dL (ref 0.57–1.00)
Globulin, Total: 2.5 g/dL (ref 1.5–4.5)
Glucose: 90 mg/dL (ref 70–99)
Potassium: 4.1 mmol/L (ref 3.5–5.2)
Sodium: 140 mmol/L (ref 134–144)
Total Protein: 6.6 g/dL (ref 6.0–8.5)
eGFR: 98 mL/min/1.73 (ref >59)

## 2024-06-01 ENCOUNTER — Other Ambulatory Visit (HOSPITAL_COMMUNITY): Payer: Self-pay

## 2024-06-01 ENCOUNTER — Other Ambulatory Visit: Payer: Self-pay

## 2024-06-01 ENCOUNTER — Ambulatory Visit (INDEPENDENT_AMBULATORY_CARE_PROVIDER_SITE_OTHER): Admitting: Primary Care

## 2024-06-01 VITALS — BP 114/72 | HR 72 | Temp 98.2°F | Ht 62.5 in | Wt 219.0 lb

## 2024-06-01 DIAGNOSIS — E66812 Obesity, class 2: Secondary | ICD-10-CM

## 2024-06-01 DIAGNOSIS — F4323 Adjustment disorder with mixed anxiety and depressed mood: Secondary | ICD-10-CM

## 2024-06-01 DIAGNOSIS — R7303 Prediabetes: Secondary | ICD-10-CM

## 2024-06-01 DIAGNOSIS — Z0001 Encounter for general adult medical examination with abnormal findings: Secondary | ICD-10-CM | POA: Diagnosis not present

## 2024-06-01 DIAGNOSIS — K219 Gastro-esophageal reflux disease without esophagitis: Secondary | ICD-10-CM

## 2024-06-01 DIAGNOSIS — I10 Essential (primary) hypertension: Secondary | ICD-10-CM

## 2024-06-01 DIAGNOSIS — Z6839 Body mass index (BMI) 39.0-39.9, adult: Secondary | ICD-10-CM

## 2024-06-01 DIAGNOSIS — E6609 Other obesity due to excess calories: Secondary | ICD-10-CM

## 2024-06-01 MED ORDER — SERTRALINE HCL 25 MG PO TABS
25.0000 mg | ORAL_TABLET | Freq: Every day | ORAL | 0 refills | Status: AC
  Filled 2024-06-01: qty 30, 30d supply, fill #0
  Filled 2024-07-04: qty 30, 30d supply, fill #1
  Filled 2024-08-17: qty 30, 30d supply, fill #2

## 2024-06-01 NOTE — Assessment & Plan Note (Signed)
 Deteriorated  Discussed options for treatment including therapy versus medication versus both. She opts for medication  Start sertraline (Zoloft) 25 mg tablets for anxiety and depression.    We discussed possible side effects of headache, GI upset, drowsiness, and SI/HI.  Patient verbalized understanding.   Follow up in 4-6 weeks for re-evaluation.

## 2024-06-01 NOTE — Assessment & Plan Note (Signed)
 [facility] in [place]. Labs and office notes reviewed from August 2025.  Continue topiramate  50 mg daily, metformin  500 mg daily. Proceed with lab work and follow-up tomorrow as scheduled.  We will work on her anxiety/stress which will hopefully reduce stress eating.

## 2024-06-01 NOTE — Assessment & Plan Note (Signed)
 Controlled.  Continue hydrochlorothiazide  12.5 mg daily.  CMP reviewed from November 2025

## 2024-06-01 NOTE — Assessment & Plan Note (Signed)
 Controlled.  Continue omeprazole 20 mg daily for now. Goal is to wean off. Will work on stress/anxiety.

## 2024-06-01 NOTE — Assessment & Plan Note (Addendum)
 Improved!  Reviewed A1C from August 2025 per [facility] in [place].  Continue metformin  500 mg daily

## 2024-06-01 NOTE — Patient Instructions (Signed)
 Start sertraline (Zoloft) tablets for anxiety and depression.  Take 1/2 tablet by mouth daily for 1 week, then increase to 1 full tablet daily thereafter.  Please schedule a follow up visit for 6 weeks for follow up of anxiety/depression.  It was a pleasure to see you today!

## 2024-06-01 NOTE — Assessment & Plan Note (Signed)
 Immunizations UTD. Pap smear UTD. Mammogram UTD  Discussed the importance of a healthy diet and regular exercise in order for weight loss, and to reduce the risk of further co-morbidity.  Exam stable. Labs reviewed.   Follow up in 1 year for repeat physical.

## 2024-06-01 NOTE — Progress Notes (Signed)
 Subjective:    Patient ID: Linda Mckinney, female    DOB: [birth year], 38 y.o.   MRN: [id number]  Linda Mckinney is a very pleasant 38 y.o. female who presents today for complete physical and follow up of chronic conditions.  She would also like to discuss anxiety. Chronic for > 1 year with symptoms of feeling overwhelmed, mind racing thoughts, worrying, little motivation to do things, feeling down/depressed, irritability. Her symptoms occur daily.  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 She [facility]. Is now managed on Topamax . She admits to stress eating.   Body mass index is 39.42 kg/m.   Immunizations: -Tetanus: Completed in 2017 -Influenza: Completed this season  -HPV: Completed series.   Diet: Fair diet.  Exercise: No regular exercise.  Eye exam: Completed years ago  Dental exam: Completes semi-annually    Pap Smear: Completes per GYN Mammogram: Completed last in October 2025  BP Readings from Last 3 Encounters:  06/01/24 114/72  05/25/24 133/84  04/19/24 135/88   Wt Readings from Last 3 Encounters:  06/01/24 219 lb (99.3 kg)  05/25/24 219 lb (99.3 kg)  04/19/24 210 lb (95.3 kg)        Review of Systems  Constitutional:  Negative for unexpected weight change.  HENT:  Negative for rhinorrhea.   Respiratory:  Negative for cough and shortness of breath.   Cardiovascular:  Negative for chest pain.  Gastrointestinal:  Negative for constipation and diarrhea.  Genitourinary:  Negative for difficulty urinating.  Musculoskeletal:  Negative for arthralgias and myalgias.  Skin:  Negative for rash.  Allergic/Immunologic: Negative for environmental allergies.  Neurological:  Negative for dizziness, numbness and headaches.  Psychiatric/Behavioral:  The patient is nervous/anxious.        See HPI         Past Medical History:  Diagnosis Date   Anxiety    Exposure to HIV 06/26/2022   Partner (married  husband) is HIV positive     GERD (gastroesophageal reflux disease)    Hypertension    Pre-diabetes     Social History   Socioeconomic History   Marital status: Married    Spouse name: Not on file   Number of children: 0   Years of education: Not on file   Highest education level: Associate degree: occupational, scientist, product/process development, or vocational program  Occupational History    Comment: Nurse Tech  Tobacco Use   Smoking status: Never   Smokeless tobacco: Never  Vaping Use   Vaping status: Never Used  Substance and Sexual Activity   Alcohol use: No    Alcohol/week: 0.0 standard drinks of alcohol   Drug use: No   Sexual activity: Yes    Partners: Male    Birth control/protection: Condom  Other Topics Concern   Not on file  Social History Narrative   Single.   No children.    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    Social Drivers of Corporate Investment Banker Strain: Low Risk  (06/01/2024)   Overall Financial Resource Strain (CARDIA)    Difficulty of Paying Living Expenses: Not hard at all  Food Insecurity: No Food Insecurity (06/01/2024)   Hunger Vital Sign    Worried About Running Out of Food in the Last Year: Never true    Ran Out of Food in the Last Year: Never true  Transportation Needs: No Transportation Needs (06/01/2024)   PRAPARE -  Administrator, Civil Service (Medical): No    Lack of Transportation (Non-Medical): No  Physical Activity: Inactive (06/01/2024)   Exercise Vital Sign    Days of Exercise per Week: 0 days    Minutes of Exercise per Session: Not on file  Stress: Stress Concern Present (06/01/2024)   [employer] of Occupational Health - Occupational Stress Questionnaire    Feeling of Stress: To some extent  Social Connections: Moderately Integrated (06/01/2024)   Social Connection and Isolation Panel    Frequency of Communication with Friends and Family: More than three times a week    Frequency of Social  Gatherings with Friends and Family: Never    Attends Religious Services: More than 4 times per year    Active Member of [employer] or Organizations: No    Attends Engineer, Structural: Not on file    Marital Status: Married  Catering Manager Violence: Not on file    Past Surgical History:  Procedure Laterality Date   BREAST BIOPSY Left 10/14/2023   US  LT BREAST BX W LOC DEV 1ST LESION IMG BX SPEC US  GUIDE 10/14/2023 [facility]MO[facility]   BUNIONECTOMY  2015   CARPAL TUNNEL RELEASE Right 01/30/2021   Procedure: RIGHT CARPAL TUNNEL RELEASE;  Surgeon: Murrell Kuba, MD;  Location: [facility];  Service: Orthopedics;  Laterality: Right;  45 MIN   CHOLECYSTECTOMY      Family History  Problem Relation Age of Onset   Hypertension Mother    Depression Mother    Sleep apnea Mother    Obesity Mother    Diabetes Father    Stroke Father    Heart disease Father    Obesity Father    Hypertension Father    Hypertension Sister    Breast cancer Maternal Grandmother 40    No Known Allergies  Current Outpatient Medications on File Prior to Visit  Medication Sig Dispense Refill   docusate sodium  (COLACE) 100 MG capsule Take 1 capsule (100 mg total) by mouth 2 (two) times daily. 10 capsule 0   hydrochlorothiazide  (HYDRODIURIL ) 12.5 MG tablet Take 1 tablet (12.5 mg total) by mouth daily for blood pressure. 90 tablet 3   metFORMIN  (GLUCOPHAGE ) 500 MG tablet Take 1 tablet (500 mg total) by mouth daily with breakfast. 30 tablet 0   omeprazole (PRILOSEC) 20 MG capsule Take 20 mg by mouth daily.     polyethylene glycol powder (GLYCOLAX /MIRALAX ) 17 GM/SCOOP powder Take 17 g by mouth daily as needed. 3350 g 1   topiramate  (TOPAMAX ) 50 MG tablet Take 1 tablet (50 mg total) by mouth daily. 30 tablet 0   Vitamin D , Ergocalciferol , (DRISDOL ) 1.25 MG (50000 UNIT) CAPS capsule Take 1 capsule (50,000 Units total) by mouth every 7 (seven) days. 5 capsule 0   No current facility-administered  medications on file prior to visit.    BP 114/72   Pulse 72   Temp 98.2 F (36.8 C) (Oral)   Ht 5' 2.5 (1.588 m)   Wt 219 lb (99.3 kg)   LMP 05/11/2024 (Approximate)   SpO2 99%   BMI 39.42 kg/m  Objective:   Physical Exam HENT:     Right Ear: Tympanic membrane and ear canal normal.     Left Ear: Tympanic membrane and ear canal normal.  Eyes:     Pupils: Pupils are equal, round, and reactive to light.  Cardiovascular:     Rate and Rhythm: Normal rate and regular rhythm.  Pulmonary:  Effort: Pulmonary effort is normal.     Breath sounds: Normal breath sounds.  Abdominal:     General: Bowel sounds are normal.     Palpations: Abdomen is soft.     Tenderness: There is no abdominal tenderness.  Musculoskeletal:        General: Normal range of motion.     Cervical back: Neck supple.  Skin:    General: Skin is warm and dry.  Neurological:     Mental Status: She is alert and oriented to person, place, and time.     Cranial Nerves: No cranial nerve deficit.     Deep Tendon Reflexes:     Reflex Scores:      Patellar reflexes are 2+ on the right side and 2+ on the left side. Psychiatric:        Mood and Affect: Mood normal.     Physical Exam        Assessment & Plan:  Essential hypertension Assessment & Plan: Controlled.  Continue hydrochlorothiazide  12.5 mg daily.  CMP reviewed from November 2025   Adjustment reaction with anxiety and depression Assessment & Plan: Deteriorated  Discussed options for treatment including therapy versus medication versus both. She opts for medication  Start sertraline (Zoloft) 25 mg tablets for anxiety and depression.    We discussed possible side effects of headache, GI upset, drowsiness, and SI/HI.  Patient verbalized understanding.   Follow up in 4-6 weeks for re-evaluation.     Orders: -     Sertraline HCl; Take 1 tablet (25 mg total) by mouth daily. for anxiety and depression.  Dispense: 90 tablet; Refill:  0  Gastroesophageal reflux disease, unspecified whether esophagitis present Assessment & Plan: Controlled.  Continue omeprazole 20 mg daily for now. Goal is to wean off. Will work on stress/anxiety.   Prediabetes Assessment & Plan: Improved!  Reviewed A1C from August 2025 per [facility] in [place].  Continue metformin  500 mg daily   Encounter for annual general medical examination with abnormal findings in adult Assessment & Plan: Immunizations UTD. Pap smear UTD. Mammogram UTD  Discussed the importance of a healthy diet and regular exercise in order for weight loss, and to reduce the risk of further co-morbidity.  Exam stable. Labs reviewed.   Follow up in 1 year for repeat physical.    Class 2 obesity due to excess calories without serious comorbidity with body mass index (BMI) of 39.0 to 39.9 in adult Assessment & Plan: Following with [facility] wellness center [place]. Labs and office notes reviewed from August 2025.  Continue topiramate  50 mg daily, metformin  500 mg daily. Proceed with lab work and follow-up tomorrow as scheduled.  We will work on her anxiety/stress which will hopefully reduce stress eating.     Assessment and Plan Assessment & Plan         Comer MARLA Gaskins, NP     History of Present Illness

## 2024-06-02 ENCOUNTER — Ambulatory Visit: Admitting: Cardiovascular Disease

## 2024-06-08 ENCOUNTER — Other Ambulatory Visit (HOSPITAL_COMMUNITY): Payer: Self-pay

## 2024-06-08 ENCOUNTER — Ambulatory Visit: Admitting: Nurse Practitioner

## 2024-06-08 ENCOUNTER — Encounter: Payer: Self-pay | Admitting: Nurse Practitioner

## 2024-06-08 VITALS — BP 139/80 | HR 80 | Temp 98.2°F | Ht 62.0 in | Wt 210.0 lb

## 2024-06-08 DIAGNOSIS — E66813 Obesity, class 3: Secondary | ICD-10-CM | POA: Diagnosis not present

## 2024-06-08 DIAGNOSIS — Z6841 Body Mass Index (BMI) 40.0 and over, adult: Secondary | ICD-10-CM

## 2024-06-08 DIAGNOSIS — R638 Other symptoms and signs concerning food and fluid intake: Secondary | ICD-10-CM | POA: Diagnosis not present

## 2024-06-08 MED ORDER — TOPIRAMATE 50 MG PO TABS
50.0000 mg | ORAL_TABLET | Freq: Every day | ORAL | 0 refills | Status: DC
  Filled 2024-06-08: qty 30, 30d supply, fill #0

## 2024-06-08 NOTE — Progress Notes (Signed)
 Office: [phone]  /  Fax: [phone]  WEIGHT SUMMARY AND BIOMETRICS  Weight Lost Since Last Visit: 9lb  Weight Gained Since Last Visit: 0lb   Vitals Temp: 98.2 F (36.8 C) BP: 139/80 Pulse Rate: 80 SpO2: 100 %   Anthropometric Measurements Height: 5' 2 (1.575 m) Weight: 210 lb (95.3 kg) BMI (Calculated): 38.4 Weight at Last Visit: 219lb Weight Lost Since Last Visit: 9lb Weight Gained Since Last Visit: 0lb Starting Weight: 245lb Total Weight Loss (lbs): 35 lb (15.9 kg)   Body Composition  Body Fat %: 44.1 % Fat Mass (lbs): 92.6 lbs Muscle Mass (lbs): 111.4 lbs Total Body Water (lbs): 82.2 lbs Visceral Fat Rating : 11   Other Clinical Data Fasting: No Labs: No Today's Visit #: 40 Starting Date: 09/12/21     HPI  Chief Complaint: OBESITY  Linda Mckinney is here to discuss her progress with her obesity treatment plan. She is on the practicing portion control and making smarter food choices, such as increasing vegetables and decreasing simple carbohydrates and states she is following her eating plan approximately 50 % of the time. She states she is exercising 0 minutes 0 days per week.   Interval History:  Since last office visit she has lost 9 pounds.  She saw her PCP last on 06/01/24 and was started on Zoloft  25mg .  She has been drinking a protein shake with fruit every am since her last visit. She is eating chicken and broccoli for lunch.  She is eating a protein with a vegetable for dinner.  She is snacking on cheez its and grapes. She is drinking water daily.   Has appt with cardiology on 06/22/24  Pharmacotherapy for weight loss:   She is currently taking Topamax  50mg  for food impulse control and cravings (started after her last visit).  Denies side effects.     Previous pharmacotherapy for medical weight loss:   -Zepbound -stopped due to cost/coverage-did well -She took Topamax  and stopped due to side effects of fatigue.  -Mounjaro -stopped due to  cost -Qsymia -stopped when she started Mounjaro -stopped after last visit due to    Bariatric surgery:  Patient has not had bariatric surgery     PHYSICAL EXAM:  Blood pressure 139/80, pulse 80, temperature 98.2 F (36.8 C), height 5' 2 (1.575 m), weight 210 lb (95.3 kg), last menstrual period 06/08/2024, SpO2 100%. Body mass index is 38.41 kg/m.  General: She is overweight, cooperative, alert, well developed, and in no acute distress. PSYCH: Has normal mood, affect and thought process.   Extremities: No edema.  Neurologic: No gross sensory or motor deficits. No tremors or fasciculations noted.    DIAGNOSTIC DATA REVIEWED:  BMET    Component Value Date/Time   NA 140 05/25/2024 1145   K 4.1 05/25/2024 1145   CL 102 05/25/2024 1145   CO2 24 05/25/2024 1145   GLUCOSE 90 05/25/2024 1145   GLUCOSE 93 05/14/2022 1119   BUN 15 05/25/2024 1145   CREATININE 0.78 05/25/2024 1145   CALCIUM 9.1 05/25/2024 1145   GFRNONAA >60 01/24/2021 1449   Lab Results  Component Value Date   HGBA1C 5.6 03/01/2024   HGBA1C 5.7 01/15/2016   Lab Results  Component Value Date   INSULIN  21.4 03/01/2024   INSULIN  29.5 (H) 09/12/2021   Lab Results  Component Value Date   TSH 1.480 03/01/2024   CBC    Component Value Date/Time   WBC 9.0 03/01/2024 1038   WBC 11.2 (H) 11/07/2021 1843   RBC 4.47  03/01/2024 1038   RBC 4.09 11/07/2021 1843   HGB 12.9 03/01/2024 1038   HCT 41.4 03/01/2024 1038   PLT 252 03/01/2024 1038   MCV 93 03/01/2024 1038   MCH 28.9 03/01/2024 1038   MCH 28.6 11/07/2021 1843   MCHC 31.2 (L) 03/01/2024 1038   MCHC 32.3 11/07/2021 1843   RDW 12.7 03/01/2024 1038   Iron Studies No results found for: IRON, TIBC, FERRITIN, IRONPCTSAT Lipid Panel     Component Value Date/Time   CHOL 164 06/10/2023 1045   TRIG 47 06/10/2023 1045   HDL 54 06/10/2023 1045   CHOLHDL 3 05/14/2022 1119   VLDL 8.0 05/14/2022 1119   LDLCALC 100 (H) 06/10/2023 1045   Hepatic  Function Panel     Component Value Date/Time   PROT 6.6 05/25/2024 1145   ALBUMIN 4.1 05/25/2024 1145   AST 14 05/25/2024 1145   ALT 17 05/25/2024 1145   ALKPHOS 65 05/25/2024 1145   BILITOT 0.3 05/25/2024 1145      Component Value Date/Time   TSH 1.480 03/01/2024 1038   Nutritional Lab Results  Component Value Date   VD25OH 20.2 (L) 03/01/2024   VD25OH 40.4 06/10/2023   VD25OH 34.8 03/04/2023     ASSESSMENT AND PLAN  TREATMENT PLAN FOR OBESITY:  Recommended Dietary Goals  Linda Mckinney is currently in the action stage of change. As such, her goal is to continue weight management plan. She has agreed to practicing portion control and making smarter food choices, such as increasing vegetables and decreasing simple carbohydrates.  Behavioral Intervention  We discussed the following Behavioral Modification Strategies today: increasing lean protein intake to established goals, decreasing simple carbohydrates , increasing vegetables, increasing fiber rich foods, increasing water intake , reading food labels , keeping healthy foods at home, celebration eating strategies, continue to work on maintaining a reduced calorie state, getting the recommended amount of protein, incorporating whole foods, making healthy choices, staying well hydrated and practicing mindfulness when eating., and increase protein intake, fibrous foods (25 grams per day for women, 30 grams for men) and water to improve satiety and decrease hunger signals. .  Additional resources provided today: NA  Recommended Physical Activity Goals  Linda Mckinney has been advised to work up to 150 minutes of moderate intensity aerobic activity a week and strengthening exercises 2-3 times per week for cardiovascular health, weight loss maintenance and preservation of muscle mass.   She has agreed to Think about enjoyable ways to increase daily physical activity and overcoming barriers to exercise, Increase physical activity in their day  and reduce sedentary time (increase NEAT)., Work on scheduling and tracking physical activity. , and Combine aerobic and strengthening exercises for efficiency and improved cardiometabolic health.   Pharmacotherapy We discussed various medication options to help Linda Mckinney with her weight loss efforts and we both agreed to continue Topamax  50mg  off label for food impulse control or cravings.  Side effects discussed.  To not get pregnant while taking Topamax .  Avoid Phentermine  or Qsymia  until seen and cleared by cardiology  ASSOCIATED CONDITIONS ADDRESSED TODAY  Action/Plan  Abnormal craving -     Topiramate ; Take 1 tablet (50 mg total) by mouth daily.  Dispense: 30 tablet; Refill: 0  Class 3 severe obesity due to excess calories with body mass index (BMI) of 40.0 to 44.9 in adult, unspecified whether serious comorbidity present (HCC) -     Topiramate ; Take 1 tablet (50 mg total) by mouth daily.  Dispense: 30 tablet; Refill: 0  CMP wnl on 05/25/24  Keep appt with cardiology and PCP-see my note from 05/25/24  Return in about 4 weeks (around 07/06/2024).Linda Mckinney She was informed of the importance of frequent follow up visits to maximize her success with intensive lifestyle modifications for her multiple health conditions.   ATTESTASTION STATEMENTS:  Reviewed by clinician on day of visit: allergies, medications, problem list, medical history, surgical history, family history, social history, and previous encounter notes.     Linda Mckinney. Deshanna Kama FNP-C

## 2024-06-09 ENCOUNTER — Other Ambulatory Visit (HOSPITAL_COMMUNITY): Payer: Self-pay

## 2024-06-16 ENCOUNTER — Other Ambulatory Visit: Payer: Self-pay | Admitting: Primary Care

## 2024-06-16 DIAGNOSIS — I10 Essential (primary) hypertension: Secondary | ICD-10-CM

## 2024-06-17 ENCOUNTER — Other Ambulatory Visit (HOSPITAL_COMMUNITY): Payer: Self-pay

## 2024-06-17 MED ORDER — HYDROCHLOROTHIAZIDE 12.5 MG PO TABS
12.5000 mg | ORAL_TABLET | Freq: Every day | ORAL | 2 refills | Status: AC
  Filled 2024-06-17: qty 30, 30d supply, fill #0
  Filled 2024-07-31: qty 30, 30d supply, fill #1

## 2024-06-22 ENCOUNTER — Ambulatory Visit

## 2024-06-22 VITALS — BP 127/86 | HR 70 | Ht 62.0 in | Wt 221.4 lb

## 2024-06-22 DIAGNOSIS — I10 Essential (primary) hypertension: Secondary | ICD-10-CM

## 2024-06-22 DIAGNOSIS — R030 Elevated blood-pressure reading, without diagnosis of hypertension: Secondary | ICD-10-CM

## 2024-06-22 DIAGNOSIS — R002 Palpitations: Secondary | ICD-10-CM

## 2024-06-22 DIAGNOSIS — R9431 Abnormal electrocardiogram [ECG] [EKG]: Secondary | ICD-10-CM

## 2024-06-22 DIAGNOSIS — R55 Syncope and collapse: Secondary | ICD-10-CM

## 2024-06-22 NOTE — Progress Notes (Signed)
 Cardiology Office Note Date:  06/22/2024  ID:  Linda Mckinney, DOB July 19, 1985, MRN [id number] PCP:  Gretta Comer POUR, NP  Cardiologist:   Joelle VEAR Ren Donley, MD  Chief Complaint  Patient presents with   Palpitations      Problems Presyncope that improved with meals Palpitations Obesity M: HTZ12.5  Visits  12/25: LP, EM?   Discussed the use of AI scribe software for clinical note transcription with the patient, who gave verbal consent to proceed.  History of Present Illness: Linda Mckinney is a 38 year old female with hypertension who presents with episodes of dizziness and near syncope. She has had two recent episodes of dizziness and near syncope at work, each with sweating and mild nausea that resolved after she ate (yogurt once, cheese once). She sometimes skips meals and is unsure if she had eaten on those days. She denies vomiting, diarrhea, chest pain, or shortness of breath during these events. She has long-standing hypertension since adolescence and takes blood pressure medication but does not routinely check her blood pressure at home despite having an arm cuff. 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 Her father had a heart attack in his sixties. She does not smoke.       ROS: Please see the history of present illness. All other systems are reviewed and negative.   Past Medical History:  Diagnosis Date   Anxiety    Exposure to HIV 06/26/2022   Partner (married husband) is HIV positive     GERD (gastroesophageal reflux disease)    Hypertension    Pre-diabetes     Past Surgical History:  Procedure Laterality Date   BREAST BIOPSY Left 10/14/2023   US  LT BREAST BX W LOC DEV 1ST LESION IMG BX SPEC US  GUIDE 10/14/2023 [facility]MOGRAP[facility]   BUNIONECTOMY  2015   CARPAL TUNNEL RELEASE Right 01/30/2021   Procedure: RIGHT CARPAL TUNNEL RELEASE;  Surgeon:  Murrell Kuba, MD;  Location: [facility];  Service: Orthopedics;  Laterality: Right;  45 MIN   CHOLECYSTECTOMY      Current Outpatient Medications  Medication Sig Dispense Refill   docusate sodium  (COLACE) 100 MG capsule Take 1 capsule (100 mg total) by mouth 2 (two) times daily. 10 capsule 0   hydrochlorothiazide  (HYDRODIURIL ) 12.5 MG tablet Take 1 tablet (12.5 mg total) by mouth daily for blood pressure. 90 tablet 2   metFORMIN  (GLUCOPHAGE ) 500 MG tablet Take 1 tablet (500 mg total) by mouth daily with breakfast. 30 tablet 0   omeprazole (PRILOSEC) 20 MG capsule Take 20 mg by mouth daily.     polyethylene glycol powder (GLYCOLAX /MIRALAX ) 17 GM/SCOOP powder Take 17 g by mouth daily as needed. 3350 g 1   sertraline  (ZOLOFT ) 25 MG tablet Take 1 tablet (25 mg total) by mouth daily. for anxiety and depression. 90 tablet 0   topiramate  (TOPAMAX ) 50 MG tablet Take 1 tablet (50 mg total) by mouth daily. 30 tablet 0   Vitamin D , Ergocalciferol , (DRISDOL ) 1.25 MG (50000 UNIT) CAPS capsule Take 1 capsule (50,000 Units total) by mouth every 7 (seven) days. 5 capsule 0   No current facility-administered medications for this visit.    Allergies:   Patient has no known allergies.   Social History:  see above  Family History:  see above  PHYSICAL EXAM: VS:  BP 127/86   Pulse 70   Ht  5' 2 (1.575 m)   Wt 221 lb 6.4 oz (100.4 kg)   LMP 06/08/2024 (Exact Date)   SpO2 99%   BMI 40.49 kg/m  , BMI Body mass index is 40.49 kg/m. GEN: Well nourished, well developed, in no acute distress HEENT: normal Neck: no JVD, carotid bruits, or masses Cardiac: RRR; no murmurs, rubs, or gallops,no edema  Respiratory:  CTAB bilaterally, normal work of breathing GI: soft, nontender, nondistended, + BS Extremities: No LE edema Skin: warm and dry, no rash Neuro:  Strength and sensation are intact  EKG: LVH  Recent Labs: Reviewed  Studies: Reviewed  ASSESSMENT AND PLAN: Linda Mckinney is a  38 y.o. female who presents for new visit.     Essential hypertension with possible left ventricular hypertrophy Hypertension diagnosed in adolescence with possible LVH on EKG. Suspected higher blood pressure than recorded.  - Ordered echocardiogram to assess for LVH. - Ordered 24-hour ambulatory blood pressure monitoring. - Instructed to check blood pressure three to four times weekly, starting in the morning. - Check renin-aldosterone levels. - BP log.  Palpitations Likely related to energy drink consumption. No palpitations without energy drinks. - Advised to reduce energy drink consumption.  Syncope Near syncope episodes relieved by food. No cardiac origin suspected.      Signed, Joelle VEAR Ren Donley, MD  06/22/2024 2:22 PM    [facility]

## 2024-06-22 NOTE — Patient Instructions (Addendum)
 Medication Instructions:  Your physician recommends that you continue on your current medications as directed. Please refer to the Current Medication list given to you today.  *If you need a refill on your cardiac medications before your next appointment, please call your pharmacy*  Lab Work: Today- aldosterone renin ratio If you have labs (blood work) drawn today and your tests are completely normal, you will receive your results only by: MyChart Message (if you have MyChart) OR A paper copy in the mail If you have any lab test that is abnormal or we need to change your treatment, we will call you to review the results.  Testing/Procedures  24 hour blood pressure monitor   Your physician has requested that you have an echocardiogram. Echocardiography is a painless test that uses sound waves to create images of your heart. It provides your doctor with information about the size and shape of your heart and how well your heart's chambers and valves are working. This procedure takes approximately one hour. There are no restrictions for this procedure. Please do NOT wear cologne, perfume, aftershave, or lotions (deodorant is allowed). Please arrive 15 minutes prior to your appointment time.  Please note: We ask at that you not bring children with you during ultrasound (echo/ vascular) testing. Due to room size and safety concerns, children are not allowed in the ultrasound rooms during exams. Our front office staff cannot provide observation of children in our lobby area while testing is being conducted. An adult accompanying a patient to their appointment will only be allowed in the ultrasound room at the discretion of the ultrasound technician under special circumstances. We apologize for any inconvenience.   Follow-Up: At [facility], you and your health needs are our priority.  As part of our continuing mission to provide you with exceptional heart care, our providers are all part of  one team.  This team includes your primary Cardiologist (physician) and Advanced Practice Providers or APPs (Physician Assistants and Nurse Practitioners) who all work together to provide you with the care you need, when you need it.  Your next appointment:   1 year(s)  Provider:   Joelle VEAR Ren Donley, MD

## 2024-06-27 LAB — ALDOSTERONE + RENIN ACTIVITY W/ RATIO
Aldos/Renin Ratio: 3.6 (ref 0.0–30.0)
Aldosterone: 7.1 ng/dL (ref 0.0–30.0)
Renin Activity, Plasma: 1.962 ng/mL/h (ref 0.167–5.380)

## 2024-06-28 ENCOUNTER — Ambulatory Visit: Payer: Self-pay

## 2024-06-29 ENCOUNTER — Encounter: Payer: Self-pay | Admitting: Primary Care

## 2024-06-29 ENCOUNTER — Ambulatory Visit: Admitting: Primary Care

## 2024-06-29 VITALS — BP 118/78 | HR 67 | Temp 98.2°F | Ht 62.5 in | Wt 220.1 lb

## 2024-06-29 DIAGNOSIS — F4323 Adjustment disorder with mixed anxiety and depressed mood: Secondary | ICD-10-CM

## 2024-06-29 NOTE — Progress Notes (Signed)
 Subjective:    Patient ID: Linda Mckinney, female    DOB: February 22, 1986, 38 y.o.   MRN: [id number]  Linda Mckinney is a very pleasant 38 y.o. female with a history of hypertension, obesity, prediabetes, anxiety and depression who presents today   She was last evaluated on 06/01/2024 for her routine physical when she wanted to discuss chronic anxiety.  Over the last year her anxiety had increased with symptoms of feeling overwhelmed, racing thoughts, feeling down/depressed, irritability, worrying.  She is under tremendous amount of stress in her personal life.  We discussed options for treatment and initiated Zoloft  25 mg daily.  She is here for follow-up today.  Since her last visit she's feeling better. Positive effects include less worrying, less irritable, feeling more positive and less sad. Overall she's pleased with her results.   She denies nausea, GI, SI/HI. She is tolerating well.   Wt Readings from Last 3 Encounters:  06/29/24 220 lb 2 oz (99.8 kg)  06/22/24 221 lb 6.4 oz (100.4 kg)  06/08/24 210 lb (95.3 kg)   BP Readings from Last 3 Encounters:  06/29/24 118/78  06/22/24 127/86  06/08/24 139/80      Review of Systems  Respiratory:  Negative for shortness of breath.   Cardiovascular:  Negative for chest pain.  Psychiatric/Behavioral:  Negative for suicidal ideas. The patient is not nervous/anxious.          Past Medical History:  Diagnosis Date   Anxiety    Exposure to HIV 06/26/2022   Partner (married husband) is HIV positive     GERD (gastroesophageal reflux disease)    Hypertension    Pre-diabetes     Social History   Socioeconomic History   Marital status: Married    Spouse name: Not on file   Number of children: 0   Years of education: Not on file   Highest education level: Associate degree: occupational, scientist, product/process development, or vocational program  Occupational History    Comment: Nurse Tech  Tobacco Use   Smoking status: Never   Smokeless tobacco:  Never  Vaping Use   Vaping status: Never Used  Substance and Sexual Activity   Alcohol use: No    Alcohol/week: 0.0 standard drinks of alcohol   Drug use: No   Sexual activity: Yes    Partners: Male    Birth control/protection: Condom  Other Topics Concern   Not on file  Social History Narrative   Single.   No children.    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    Social Drivers of Health   Tobacco Use: Low Risk (06/29/2024)   Patient History    Smoking Tobacco Use: Never    Smokeless Tobacco Use: Never    Passive Exposure: Not on file  Financial Resource Strain: Low Risk (06/01/2024)   Overall Financial Resource Strain (CARDIA)    Difficulty of Paying Living Expenses: Not hard at all  Food Insecurity: No Food Insecurity (06/01/2024)   Epic    Worried About Programme Researcher, Broadcasting/film/video in the Last Year: Never true    Ran Out of Food in the Last Year: Never true  Transportation Needs: No Transportation Needs (06/01/2024)   Epic    Lack of Transportation (Medical): No    Lack of Transportation (Non-Medical): No  Physical Activity: Inactive (06/01/2024)   Exercise Vital Sign    Days of Exercise per Week: 0 days    Minutes of Exercise per Session:  Not on file  Stress: Stress Concern Present (06/01/2024)   [employer] of Occupational Health - Occupational Stress Questionnaire    Feeling of Stress: To some extent  Social Connections: Moderately Integrated (06/01/2024)   Social Connection and Isolation Panel    Frequency of Communication with Friends and Family: More than three times a week    Frequency of Social Gatherings with Friends and Family: Never    Attends Religious Services: More than 4 times per year    Active Member of [employer] or Organizations: No    Attends Engineer, Structural: Not on file    Marital Status: Married  Catering Manager Violence: Not on file  Depression (PHQ2-9): Low Risk (06/29/2024)   Depression (PHQ2-9)    PHQ-2  Score: 3  Recent Concern: Depression (PHQ2-9) - Medium Risk (06/01/2024)   Depression (PHQ2-9)    PHQ-2 Score: 5  Alcohol Screen: Not on file  Housing: Low Risk (06/01/2024)   Epic    Unable to Pay for Housing in the Last Year: No    Number of Times Moved in the Last Year: 0    Homeless in the Last Year: No  Utilities: Not on file  Health Literacy: Not on file    Past Surgical History:  Procedure Laterality Date   BREAST BIOPSY Left 10/14/2023   US  LT BREAST BX W LOC DEV 1ST LESION IMG BX SPEC US  GUIDE 10/14/2023 [facility]   BUNIONECTOMY  2015   CARPAL TUNNEL RELEASE Right 01/30/2021   Procedure: RIGHT CARPAL TUNNEL RELEASE;  Surgeon: Murrell Kuba, MD;  Location: [facility];  Service: Orthopedics;  Laterality: Right;  45 MIN   CHOLECYSTECTOMY      Family History  Problem Relation Age of Onset   Hypertension Mother    Depression Mother    Sleep apnea Mother    Obesity Mother    Diabetes Father    Stroke Father    Heart disease Father    Obesity Father    Hypertension Father    Hypertension Sister    Breast cancer Maternal Grandmother 40    Allergies[1]  Medications Ordered Prior to Encounter[2]  BP 118/78   Pulse 67   Temp 98.2 F (36.8 C) (Oral)   Ht 5' 2.5 (1.588 m)   Wt 220 lb 2 oz (99.8 kg)   LMP 06/08/2024 (Exact Date)   SpO2 100%   BMI 39.62 kg/m  Objective:   Physical Exam Cardiovascular:     Rate and Rhythm: Normal rate and regular rhythm.  Pulmonary:     Effort: Pulmonary effort is normal.     Breath sounds: Normal breath sounds.  Musculoskeletal:     Cervical back: Neck supple.  Skin:    General: Skin is warm and dry.  Neurological:     Mental Status: She is alert and oriented to person, place, and time.  Psychiatric:        Mood and Affect: Mood normal.     Physical Exam        Assessment & Plan:  Adjustment reaction with anxiety and depression Assessment & Plan: Improved!  She is happy with her progress  and we will continue Zoloft  25 mg daily. She will update if anything changes.     Assessment and Plan Assessment & Plan         Comer MARLA Gaskins, NP       [1] No Known Allergies [2]  Current Outpatient Medications on File Prior to Visit  Medication Sig Dispense Refill   docusate sodium  (COLACE) 100 MG capsule Take 1 capsule (100 mg total) by mouth 2 (two) times daily. 10 capsule 0   hydrochlorothiazide  (HYDRODIURIL ) 12.5 MG tablet Take 1 tablet (12.5 mg total) by mouth daily for blood pressure. 90 tablet 2   metFORMIN  (GLUCOPHAGE ) 500 MG tablet Take 1 tablet (500 mg total) by mouth daily with breakfast. 30 tablet 0   omeprazole (PRILOSEC) 20 MG capsule Take 20 mg by mouth daily.     polyethylene glycol powder (GLYCOLAX /MIRALAX ) 17 GM/SCOOP powder Take 17 g by mouth daily as needed. 3350 g 1   sertraline  (ZOLOFT ) 25 MG tablet Take 1 tablet (25 mg total) by mouth daily. for anxiety and depression. 90 tablet 0   topiramate  (TOPAMAX ) 50 MG tablet Take 1 tablet (50 mg total) by mouth daily. 30 tablet 0   Vitamin D , Ergocalciferol , (DRISDOL ) 1.25 MG (50000 UNIT) CAPS capsule Take 1 capsule (50,000 Units total) by mouth every 7 (seven) days. 5 capsule 0   No current facility-administered medications on file prior to visit.

## 2024-06-29 NOTE — Patient Instructions (Signed)
 Continue taking Zoloft  25 mg daily for anxiety/depression.  It was a pleasure to see you today!

## 2024-06-29 NOTE — Assessment & Plan Note (Signed)
 Improved!  She is happy with her progress and we will continue Zoloft  25 mg daily. She will update if anything changes.

## 2024-07-05 ENCOUNTER — Other Ambulatory Visit (HOSPITAL_COMMUNITY): Payer: Self-pay

## 2024-07-06 ENCOUNTER — Encounter: Payer: Self-pay | Admitting: Nurse Practitioner

## 2024-07-06 ENCOUNTER — Ambulatory Visit: Admitting: Nurse Practitioner

## 2024-07-06 DIAGNOSIS — I10 Essential (primary) hypertension: Secondary | ICD-10-CM | POA: Diagnosis not present

## 2024-07-06 DIAGNOSIS — E66812 Obesity, class 2: Secondary | ICD-10-CM

## 2024-07-06 DIAGNOSIS — Z6838 Body mass index (BMI) 38.0-38.9, adult: Secondary | ICD-10-CM | POA: Diagnosis not present

## 2024-07-06 NOTE — Progress Notes (Signed)
 "  Office: [phone]  /  Fax: [phone]  WEIGHT SUMMARY AND BIOMETRICS  Weight Lost Since Last Visit: 0lb  Weight Gained Since Last Visit: 2lb   Vitals Temp: 98.3 F (36.8 C) BP: 133/84 Pulse Rate: 71 SpO2: 100 %   Anthropometric Measurements Height: 5' 2 (1.575 m) Weight: 212 lb (96.2 kg) BMI (Calculated): 38.77 Weight at Last Visit: 210lb Weight Lost Since Last Visit: 0lb Weight Gained Since Last Visit: 2lb Starting Weight: 245lb Total Weight Loss (lbs): 33 lb (15 kg)   Body Composition  Body Fat %: 45.6 % Fat Mass (lbs): 97 lbs Muscle Mass (lbs): 109.8 lbs Total Body Water (lbs): 85.6 lbs Visceral Fat Rating : 12   Other Clinical Data Fasting: No Labs: No Today's Visit #: 41 Starting Date: 09/12/21     HPI  Chief Complaint: OBESITY  Linda Mckinney is here to discuss her progress with her obesity treatment plan. She is on the practicing portion control and making smarter food choices, such as increasing vegetables and decreasing simple carbohydrates and states she is following her eating plan approximately 50 % of the time. She states she is exercising 0 minutes 0 days per week.   Interval History:  Since last office visit she has gained 2 pounds.  She has had multiple celebrations since her last visit.  She is drinking water, 1 red bull per week and 1-2 sodas per week. She stopped drinking her electrolytes.  She is not currently exercising.     Pharmacotherapy for weight loss: She is not currently taking medications  for medical weight loss.  She stopped Topamax  after her last visit due to side effects of daytime sleepiness.    Previous pharmacotherapy for medical weight loss:   -Zepbound -stopped due to cost/coverage-did well -She took Topamax  and stopped due to side effects of fatigue/daytime sleepiness.  -Mounjaro -stopped due to cost -Qsymia -stopped when she started Mounjaro    Bariatric surgery:  Patient has not had bariatric surgery    Hypertension Hypertension stable. She saw cardiology last on 06/22/24.  Echo 08/03/24  and pt to monitor BP at home.   Medication(s): HCTZ 12.5mg . Denies side effects.   Denies chest pain, palpitations and SOB.  BP Readings from Last 3 Encounters:  07/06/24 133/84  06/29/24 118/78  06/22/24 127/86   Lab Results  Component Value Date   CREATININE 0.78 05/25/2024   CREATININE 0.88 03/01/2024   CREATININE 0.96 10/28/2023        PHYSICAL EXAM:  Blood pressure 133/84, pulse 71, temperature 98.3 F (36.8 C), height 5' 2 (1.575 m), weight 212 lb (96.2 kg), last menstrual period 07/06/2024, SpO2 100%. Body mass index is 38.78 kg/m.  General: She is overweight, cooperative, alert, well developed, and in no acute distress. PSYCH: Has normal mood, affect and thought process.   Extremities: No edema.  Neurologic: No gross sensory or motor deficits. No tremors or fasciculations noted.    DIAGNOSTIC DATA REVIEWED:  BMET    Component Value Date/Time   NA 140 05/25/2024 1145   K 4.1 05/25/2024 1145   CL 102 05/25/2024 1145   CO2 24 05/25/2024 1145   GLUCOSE 90 05/25/2024 1145   GLUCOSE 93 05/14/2022 1119   BUN 15 05/25/2024 1145   CREATININE 0.78 05/25/2024 1145   CALCIUM 9.1 05/25/2024 1145   GFRNONAA >60 01/24/2021 1449   Lab Results  Component Value Date   HGBA1C 5.6 03/01/2024   HGBA1C 5.7 01/15/2016   Lab Results  Component Value Date   INSULIN  21.4  03/01/2024   INSULIN  29.5 (H) 09/12/2021   Lab Results  Component Value Date   TSH 1.480 03/01/2024   CBC    Component Value Date/Time   WBC 9.0 03/01/2024 1038   WBC 11.2 (H) 11/07/2021 1843   RBC 4.47 03/01/2024 1038   RBC 4.09 11/07/2021 1843   HGB 12.9 03/01/2024 1038   HCT 41.4 03/01/2024 1038   PLT 252 03/01/2024 1038   MCV 93 03/01/2024 1038   MCH 28.9 03/01/2024 1038   MCH 28.6 11/07/2021 1843   MCHC 31.2 (L) 03/01/2024 1038   MCHC 32.3 11/07/2021 1843   RDW 12.7 03/01/2024 1038   Iron  Studies No results found for: IRON, TIBC, FERRITIN, IRONPCTSAT Lipid Panel     Component Value Date/Time   CHOL 164 06/10/2023 1045   TRIG 47 06/10/2023 1045   HDL 54 06/10/2023 1045   CHOLHDL 3 05/14/2022 1119   VLDL 8.0 05/14/2022 1119   LDLCALC 100 (H) 06/10/2023 1045   Hepatic Function Panel     Component Value Date/Time   PROT 6.6 05/25/2024 1145   ALBUMIN 4.1 05/25/2024 1145   AST 14 05/25/2024 1145   ALT 17 05/25/2024 1145   ALKPHOS 65 05/25/2024 1145   BILITOT 0.3 05/25/2024 1145      Component Value Date/Time   TSH 1.480 03/01/2024 1038   Nutritional Lab Results  Component Value Date   VD25OH 20.2 (L) 03/01/2024   VD25OH 40.4 06/10/2023   VD25OH 34.8 03/04/2023     ASSESSMENT AND PLAN  TREATMENT PLAN FOR OBESITY:  Recommended Dietary Goals  Faron is currently in the action stage of change. As such, her goal is to continue weight management plan. She has agreed to practicing portion control and making smarter food choices, such as increasing vegetables and decreasing simple carbohydrates.  Behavioral Intervention  We discussed the following Behavioral Modification Strategies today: increasing lean protein intake to established goals, decreasing simple carbohydrates , increasing vegetables, increasing fiber rich foods, increasing water intake , reading food labels , keeping healthy foods at home, celebration eating strategies, continue to work on maintaining a reduced calorie state, getting the recommended amount of protein, incorporating whole foods, making healthy choices, staying well hydrated and practicing mindfulness when eating., and increase protein intake, fibrous foods (25 grams per day for women, 30 grams for men) and water to improve satiety and decrease hunger signals. .  Additional resources provided today: NA  Recommended Physical Activity Goals  Linzi has been advised to work up to 150 minutes of moderate intensity aerobic  activity a week and strengthening exercises 2-3 times per week for cardiovascular health, weight loss maintenance and preservation of muscle mass.   She has agreed to Think about enjoyable ways to increase daily physical activity and overcoming barriers to exercise, Increase physical activity in their day and reduce sedentary time (increase NEAT)., Work on scheduling and tracking physical activity. , Increase volume of physical activity to a goal of 240 minutes a week, and Combine aerobic and strengthening exercises for efficiency and improved cardiometabolic health.   Pharmacotherapy To consider options based upon cost in the new year.   Avoid Phentermine  or Qsymia  until seen and cleared by cardiology.  To discuss with cardiology.   ASSOCIATED CONDITIONS ADDRESSED TODAY  Action/Plan  Essential hypertension Keep appt for echo Continue hydrochlorothiazide  12.5mg .    Class 2 severe obesity due to excess calories with serious comorbidity and body mass index (BMI) of 38.0 to 38.9 in adult  Return in about 4 weeks (around 08/03/2024).SABRA She was informed of the importance of frequent follow up visits to maximize her success with intensive lifestyle modifications for her multiple health conditions.   ATTESTASTION STATEMENTS:  Reviewed by clinician on day of visit: allergies, medications, problem list, medical history, surgical history, family history, social history, and previous encounter notes.   I personally spent a total of 38 minutes in the care of the patient today including preparing to see the patient, getting/reviewing separately obtained history, counseling and educating, and documenting clinical information in the EHR.    Corean SAUNDERS. Zae Kirtz FNP-C "

## 2024-07-31 ENCOUNTER — Other Ambulatory Visit (HOSPITAL_COMMUNITY): Payer: Self-pay

## 2024-08-03 ENCOUNTER — Ambulatory Visit (HOSPITAL_COMMUNITY)
Admission: RE | Admit: 2024-08-03 | Discharge: 2024-08-03 | Disposition: A | Discharge status: Home / Self Care | Source: Ambulatory Visit | Attending: Cardiology | Admitting: Cardiology

## 2024-08-03 DIAGNOSIS — R9431 Abnormal electrocardiogram [ECG] [EKG]: Secondary | ICD-10-CM | POA: Diagnosis not present

## 2024-08-03 LAB — ECHOCARDIOGRAM COMPLETE
Area-P 1/2: 4.1 cm2
S' Lateral: 2.7 cm

## 2024-08-17 ENCOUNTER — Encounter: Payer: Self-pay | Admitting: Nurse Practitioner

## 2024-08-17 ENCOUNTER — Other Ambulatory Visit (HOSPITAL_COMMUNITY): Payer: Self-pay

## 2024-08-17 ENCOUNTER — Other Ambulatory Visit: Payer: Self-pay

## 2024-08-17 ENCOUNTER — Ambulatory Visit: Admitting: Nurse Practitioner

## 2024-08-17 VITALS — BP 129/88 | HR 76 | Temp 98.2°F | Ht 62.0 in | Wt 219.0 lb

## 2024-08-17 DIAGNOSIS — E559 Vitamin D deficiency, unspecified: Secondary | ICD-10-CM | POA: Diagnosis not present

## 2024-08-17 DIAGNOSIS — R7303 Prediabetes: Secondary | ICD-10-CM | POA: Diagnosis not present

## 2024-08-17 DIAGNOSIS — Z6841 Body Mass Index (BMI) 40.0 and over, adult: Secondary | ICD-10-CM | POA: Diagnosis not present

## 2024-08-17 MED ORDER — VITAMIN D (ERGOCALCIFEROL) 1.25 MG (50000 UNIT) PO CAPS
50000.0000 [IU] | ORAL_CAPSULE | ORAL | 0 refills | Status: AC
  Filled 2024-08-17: qty 4, 28d supply, fill #0

## 2024-08-17 MED ORDER — PHENTERMINE-TOPIRAMATE ER 7.5-46 MG PO CP24: ORAL_CAPSULE | ORAL | 0 refills | Status: AC

## 2024-08-17 MED ORDER — METFORMIN HCL 500 MG PO TABS
500.0000 mg | ORAL_TABLET | Freq: Every day | ORAL | 0 refills | Status: AC
  Filled 2024-08-17: qty 30, 30d supply, fill #0

## 2024-09-06 ENCOUNTER — Ambulatory Visit: Admitting: Obstetrics and Gynecology

## 2024-09-07 ENCOUNTER — Ambulatory Visit: Admitting: Nurse Practitioner

## 2024-10-19 ENCOUNTER — Encounter

## 2024-11-22 ENCOUNTER — Ambulatory Visit: Admitting: Endocrinology
